# Patient Record
Sex: Female | Born: 1980 | Race: White | Hispanic: No | Marital: Single | Smoking: Never smoker
Health system: Southern US, Community
[De-identification: ages and names within clinical notes are randomized; demographics above are authoritative.]

## PROBLEM LIST (undated history)

## (undated) DIAGNOSIS — T7840XA Allergy, unspecified, initial encounter: Secondary | ICD-10-CM

## (undated) DIAGNOSIS — D649 Anemia, unspecified: Secondary | ICD-10-CM

## (undated) DIAGNOSIS — L918 Other hypertrophic disorders of the skin: Secondary | ICD-10-CM

## (undated) DIAGNOSIS — N979 Female infertility, unspecified: Secondary | ICD-10-CM

## (undated) HISTORY — DX: Allergy, unspecified, initial encounter: T78.40XA

## (undated) HISTORY — DX: Female infertility, unspecified: N97.9

## (undated) HISTORY — DX: Other hypertrophic disorders of the skin: L91.8

## (undated) HISTORY — PX: DILATION AND CURETTAGE, DIAGNOSTIC / THERAPEUTIC: SUR384

## (undated) HISTORY — DX: Anemia, unspecified: D64.9

## (undated) HISTORY — PX: ANTERIOR CRUCIATE LIGAMENT REPAIR: SHX115

---
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
## 1999-06-04 ENCOUNTER — Other Ambulatory Visit: Admission: RE | Admit: 1999-06-04 | Discharge: 1999-06-04 | Payer: Self-pay | Admitting: Family Medicine

## 2000-10-19 ENCOUNTER — Other Ambulatory Visit: Admission: RE | Admit: 2000-10-19 | Discharge: 2000-10-19 | Payer: Self-pay | Admitting: Obstetrics and Gynecology

## 2001-11-24 ENCOUNTER — Ambulatory Visit (HOSPITAL_COMMUNITY): Admission: RE | Admit: 2001-11-24 | Discharge: 2001-11-24 | Payer: Self-pay | Admitting: *Deleted

## 2002-04-09 ENCOUNTER — Inpatient Hospital Stay (HOSPITAL_COMMUNITY): Admission: AD | Admit: 2002-04-09 | Discharge: 2002-04-11 | Payer: Self-pay | Admitting: *Deleted

## 2003-05-28 ENCOUNTER — Inpatient Hospital Stay (HOSPITAL_COMMUNITY): Admission: AD | Admit: 2003-05-28 | Discharge: 2003-05-29 | Payer: Self-pay | Admitting: Obstetrics

## 2004-02-16 ENCOUNTER — Inpatient Hospital Stay (HOSPITAL_COMMUNITY): Admission: AD | Admit: 2004-02-16 | Discharge: 2004-02-17 | Payer: Self-pay | Admitting: Obstetrics and Gynecology

## 2004-02-18 ENCOUNTER — Inpatient Hospital Stay (HOSPITAL_COMMUNITY): Admission: AD | Admit: 2004-02-18 | Discharge: 2004-02-18 | Payer: Self-pay | Admitting: Obstetrics and Gynecology

## 2004-02-20 ENCOUNTER — Inpatient Hospital Stay (HOSPITAL_COMMUNITY): Admission: AD | Admit: 2004-02-20 | Discharge: 2004-02-20 | Payer: Self-pay | Admitting: Obstetrics & Gynecology

## 2004-02-29 ENCOUNTER — Inpatient Hospital Stay (HOSPITAL_COMMUNITY): Admission: AD | Admit: 2004-02-29 | Discharge: 2004-02-29 | Payer: Self-pay | Admitting: Obstetrics & Gynecology

## 2004-03-06 ENCOUNTER — Inpatient Hospital Stay (HOSPITAL_COMMUNITY): Admission: AD | Admit: 2004-03-06 | Discharge: 2004-03-06 | Payer: Self-pay | Admitting: *Deleted

## 2006-12-31 ENCOUNTER — Inpatient Hospital Stay (HOSPITAL_COMMUNITY): Admission: AD | Admit: 2006-12-31 | Discharge: 2006-12-31 | Payer: Self-pay | Admitting: Obstetrics and Gynecology

## 2007-01-02 ENCOUNTER — Inpatient Hospital Stay (HOSPITAL_COMMUNITY): Admission: AD | Admit: 2007-01-02 | Discharge: 2007-01-02 | Payer: Self-pay | Admitting: Obstetrics and Gynecology

## 2008-04-15 ENCOUNTER — Inpatient Hospital Stay (HOSPITAL_COMMUNITY): Admission: AD | Admit: 2008-04-15 | Discharge: 2008-04-15 | Payer: Self-pay | Admitting: Family Medicine

## 2008-04-17 ENCOUNTER — Inpatient Hospital Stay (HOSPITAL_COMMUNITY): Admission: AD | Admit: 2008-04-17 | Discharge: 2008-04-18 | Payer: Self-pay | Admitting: Obstetrics & Gynecology

## 2008-04-25 ENCOUNTER — Inpatient Hospital Stay (HOSPITAL_COMMUNITY): Admission: AD | Admit: 2008-04-25 | Discharge: 2008-04-25 | Payer: Self-pay | Admitting: Obstetrics & Gynecology

## 2008-04-30 ENCOUNTER — Inpatient Hospital Stay (HOSPITAL_COMMUNITY): Admission: AD | Admit: 2008-04-30 | Discharge: 2008-05-01 | Payer: Self-pay | Admitting: Obstetrics & Gynecology

## 2008-05-31 ENCOUNTER — Inpatient Hospital Stay (HOSPITAL_COMMUNITY): Admission: AD | Admit: 2008-05-31 | Discharge: 2008-06-01 | Payer: Self-pay | Admitting: Obstetrics and Gynecology

## 2008-12-02 ENCOUNTER — Inpatient Hospital Stay (HOSPITAL_COMMUNITY): Admission: AD | Admit: 2008-12-02 | Discharge: 2008-12-04 | Payer: Self-pay | Admitting: Obstetrics and Gynecology

## 2009-08-01 ENCOUNTER — Emergency Department (HOSPITAL_COMMUNITY): Admission: EM | Admit: 2009-08-01 | Discharge: 2009-08-01 | Payer: Self-pay | Admitting: Emergency Medicine

## 2009-08-02 LAB — CONVERTED CEMR LAB
HCT: 37 %
Hemoglobin: 12.4 g/dL
MCHC: 33.4 g/dL
MCV: 89.4 fL
Platelets: 325 10*3/uL
RBC: 4.14 M/uL
RDW: 14.6 %
WBC: 8.7 10*3/uL

## 2009-08-03 ENCOUNTER — Inpatient Hospital Stay (HOSPITAL_COMMUNITY): Admission: EM | Admit: 2009-08-03 | Discharge: 2009-08-04 | Payer: Self-pay | Admitting: Emergency Medicine

## 2009-08-15 ENCOUNTER — Encounter (INDEPENDENT_AMBULATORY_CARE_PROVIDER_SITE_OTHER): Payer: Self-pay | Admitting: Nurse Practitioner

## 2009-08-15 ENCOUNTER — Ambulatory Visit: Payer: Self-pay | Admitting: Internal Medicine

## 2009-08-15 DIAGNOSIS — J189 Pneumonia, unspecified organism: Secondary | ICD-10-CM

## 2009-08-15 LAB — CONVERTED CEMR LAB
ALT: 23 units/L (ref 0–35)
AST: 17 units/L (ref 0–37)
Albumin: 4.1 g/dL (ref 3.5–5.2)
Alkaline Phosphatase: 64 units/L (ref 39–117)
BUN: 14 mg/dL
BUN: 15 mg/dL (ref 6–23)
Basophils Absolute: 0 10*3/uL (ref 0.0–0.1)
Basophils Relative: 0 % (ref 0–1)
CO2: 25 meq/L (ref 19–32)
Calcium: 8.3 mg/dL
Calcium: 9.1 mg/dL (ref 8.4–10.5)
Chloride: 104 meq/L
Chloride: 105 meq/L (ref 96–112)
Creatinine, Ser: 0.71 mg/dL
Creatinine, Ser: 0.76 mg/dL (ref 0.40–1.20)
Eosinophils Absolute: 0.2 10*3/uL (ref 0.0–0.7)
Eosinophils Relative: 2 % (ref 0–5)
Glucose, Bld: 88 mg/dL
Glucose, Bld: 97 mg/dL (ref 70–99)
HCT: 41.2 % (ref 36.0–46.0)
Hemoglobin: 13.1 g/dL (ref 12.0–15.0)
Lymphocytes Relative: 24 % (ref 12–46)
Lymphs Abs: 2 10*3/uL (ref 0.7–4.0)
MCHC: 31.8 g/dL (ref 30.0–36.0)
MCV: 91.4 fL (ref 78.0–100.0)
Monocytes Absolute: 0.7 10*3/uL (ref 0.1–1.0)
Monocytes Relative: 8 % (ref 3–12)
Neutro Abs: 5.5 10*3/uL (ref 1.7–7.7)
Neutrophils Relative %: 66 % (ref 43–77)
Platelets: 446 10*3/uL — ABNORMAL HIGH (ref 150–400)
Potassium: 3.4 meq/L
Potassium: 3.8 meq/L (ref 3.5–5.3)
RBC: 4.51 M/uL (ref 3.87–5.11)
RDW: 15.3 % (ref 11.5–15.5)
Sodium: 136 meq/L
Sodium: 140 meq/L (ref 135–145)
Total Bilirubin: 0.4 mg/dL (ref 0.3–1.2)
Total Protein: 7.1 g/dL (ref 6.0–8.3)
WBC: 8.3 10*3/uL (ref 4.0–10.5)

## 2009-08-18 ENCOUNTER — Encounter (INDEPENDENT_AMBULATORY_CARE_PROVIDER_SITE_OTHER): Payer: Self-pay | Admitting: Nurse Practitioner

## 2010-07-21 NOTE — Letter (Signed)
Summary: PT INFORMATION SHEET  PT INFORMATION SHEET   Imported By: Arta Bruce 10/07/2009 15:19:54  _____________________________________________________________________  External Attachment:    Type:   Image     Comment:   External Document

## 2010-07-21 NOTE — Letter (Signed)
Summary: Handout Printed  Printed Handout:  - B.R.A.T. Diet, Adults

## 2010-07-21 NOTE — Assessment & Plan Note (Signed)
Summary: NEW - Establish Care   Vital Signs:  Patient profile:   30 year old female LMP:     07/29/2009 Height:      64.5 inches Weight:      244.8 pounds BMI:     41.52 BSA:     2.15 O2 Sat:      96 % on Room air Temp:     97.7 degrees F oral Pulse rate:   99 / minute Pulse rhythm:   regular Resp:     20 per minute BP sitting:   119 / 83  (left arm) Cuff size:   regular  Vitals Entered By: Levon Hedger (August 15, 2009 10:18 AM)  O2 Flow:  Room air CC: follow-up visit [facility] for pneumonia Is Patient Diabetic? No Pain Assessment Patient in pain? no       Does patient need assistance? Functional Status Self care Ambulation Normal LMP (date): 07/29/2009     Enter LMP: 07/29/2009   CC:  follow-up visit [facility] for pneumonia.  History of Present Illness:  Pt into the office to establish care No PCP  No signficant PMH PSH - 1997 ACL repair (left)  Hospital admission in [facility]/12/2011at [facility] for Pneumonia. Her initial presentation was with SOB, cough, N&V CXRAY - right lower lobe pneumonia (community vs aspiration) Dx Pneumonia - pt was treated with IV Rocephin and azithromycin. Pt was discharged on antibiotics which she took twice per day and she has finished the course.  After treatment symptoms got better. Appetite improved No fever No congestion but still slight cough still persists  2 children ages 65 and 8 months at home with GI illness on yesterday and she had a bout of diarrhea on yesterday.  She has done well today and ate breakfast which she was able to hold.  Social is a [employer] [place] [employer] native.   She is employed at [employer] store  CPE - last done during her pregnancy - infant if 75 months old   Habits & Providers  Alcohol-Tobacco-Diet     Alcohol drinks/day: 0     Tobacco Status: never  Exercise-Depression-Behavior     Does Patient Exercise: no     Have you felt down or hopeless? no     Have you felt little pleasure in  things? no     Depression Counseling: not indicated; screening negative for depression     Drug Use: never  Medications Prior to Update: 1)  None  Allergies (verified): 1)  ! Sulfa  Family History: maternal grandmother - diabetes mother- htn father - htn father - MI father - CVA  Social History: 2 children - natural births single Denies any ETOH, drug use or tobacco Smoking Status:  never Drug Use:  never Does Patient Exercise:  no  Review of Systems General:  Denies fever. CV:  Denies chest pain or discomfort and shortness of breath with exertion. Resp:  Complains of cough; slight cough presists but has greatly improved since hospital d/c. GI:  Complains of nausea and vomiting; denies abdominal pain; x 1 episode on yesterday.  Physical Exam  General:  alert.   Head:  normocephalic.   Lungs:  normal breath sounds.   Heart:  normal rate and regular rhythm.   Abdomen:  normal bowel sounds.   Msk:  up to the exam table Neurologic:  alert & oriented X3 and gait normal.   Skin:  color normal.   Psych:  Oriented X3.     Impression &  Recommendations:  Problem # 1:  PNEUMONIA (ICD-486) improved will recheck labs Orders: Pulse Oximetry (single measurment) (16109) T-CBC w/Diff (60454-09811) T-Comprehensive Metabolic Panel (91478-29562) F/u in 4 months for CPE  Patient Instructions: 1)  Continue a BRAT diet with mostly liquids today and slowly advancing to soft then regular diet. 2)  Labs will be checked to be sure your blood count and potassium is back to normal. 3)  Schedule a complete physical exam in April 2011. 4)  Do not eat before this visit for labs. 5)  You will need PAP, PHQ-9.   CXR  Procedure date:  08/02/2009  Findings:      Right middle lobe pneumonia

## 2010-07-21 NOTE — Letter (Signed)
Summary: *HSN Results Follow up  [facility]  [address] [place], [place] [address]   Phone: [phone]  Fax: [phone]      08/18/2009   ASLYNN BRUNETTI Humble [address] [place], [place]  [address]   Dear  Ms. Torian Schriever,                            ____S.Drinkard,FNP   ____D. Gore,FNP       ____B. McPherson,MD   ____V. Rankins,MD    ____E. Mulberry,MD    __X__N. Daphine Deutscher, FNP  ____D. Reche Dixon, MD    ____K. Philipp Deputy, MD    ____Other     This letter is to inform you that your recent test(s):  _______Pap Smear    ___X____Lab Test     _______X-ray    ___X____ is within acceptable limits  _______ requires a medication change  _______ requires a follow-up lab visit  _______ requires a follow-up visit with your provider   Comments:  Labs done during recent office visit are ok.       _________________________________________________________ If you have any questions, please contact our office [phone].                    Sincerely,    Lehman Prom FNP [facility]

## 2010-09-10 LAB — CBC
HCT: 32.5 % — ABNORMAL LOW (ref 36.0–46.0)
HCT: 32.6 % — ABNORMAL LOW (ref 36.0–46.0)
HCT: 36.8 % (ref 36.0–46.0)
HCT: 37 % (ref 36.0–46.0)
Hemoglobin: 10.8 g/dL — ABNORMAL LOW (ref 12.0–15.0)
Hemoglobin: 11 g/dL — ABNORMAL LOW (ref 12.0–15.0)
Hemoglobin: 12.4 g/dL (ref 12.0–15.0)
Hemoglobin: 12.4 g/dL (ref 12.0–15.0)
MCHC: 33.4 g/dL (ref 30.0–36.0)
MCHC: 33.4 g/dL (ref 30.0–36.0)
MCHC: 33.5 g/dL (ref 30.0–36.0)
MCHC: 33.7 g/dL (ref 30.0–36.0)
MCV: 88.8 fL (ref 78.0–100.0)
MCV: 89.4 fL (ref 78.0–100.0)
MCV: 89.4 fL (ref 78.0–100.0)
MCV: 89.8 fL (ref 78.0–100.0)
Platelets: 306 10*3/uL (ref 150–400)
Platelets: 317 10*3/uL (ref 150–400)
Platelets: 325 10*3/uL (ref 150–400)
Platelets: 326 10*3/uL (ref 150–400)
RBC: 3.65 MIL/uL — ABNORMAL LOW (ref 3.87–5.11)
RBC: 3.66 MIL/uL — ABNORMAL LOW (ref 3.87–5.11)
RBC: 4.1 MIL/uL (ref 3.87–5.11)
RBC: 4.14 MIL/uL (ref 3.87–5.11)
RDW: 14.4 % (ref 11.5–15.5)
RDW: 14.6 % (ref 11.5–15.5)
RDW: 15 % (ref 11.5–15.5)
RDW: 15.1 % (ref 11.5–15.5)
WBC: 11.6 10*3/uL — ABNORMAL HIGH (ref 4.0–10.5)
WBC: 6.4 10*3/uL (ref 4.0–10.5)
WBC: 7.4 10*3/uL (ref 4.0–10.5)
WBC: 8.7 10*3/uL (ref 4.0–10.5)

## 2010-09-10 LAB — BASIC METABOLIC PANEL
BUN: 10 mg/dL (ref 6–23)
BUN: 14 mg/dL (ref 6–23)
BUN: 5 mg/dL — ABNORMAL LOW (ref 6–23)
CO2: 23 mEq/L (ref 19–32)
CO2: 26 mEq/L (ref 19–32)
CO2: 27 mEq/L (ref 19–32)
Calcium: 8 mg/dL — ABNORMAL LOW (ref 8.4–10.5)
Calcium: 8.1 mg/dL — ABNORMAL LOW (ref 8.4–10.5)
Calcium: 8.3 mg/dL — ABNORMAL LOW (ref 8.4–10.5)
Chloride: 104 mEq/L (ref 96–112)
Chloride: 107 mEq/L (ref 96–112)
Chloride: 108 mEq/L (ref 96–112)
Creatinine, Ser: 0.62 mg/dL (ref 0.4–1.2)
Creatinine, Ser: 0.71 mg/dL (ref 0.4–1.2)
Creatinine, Ser: 0.73 mg/dL (ref 0.4–1.2)
GFR calc Af Amer: >60 mL/min (ref >60)
GFR calc Af Amer: >60 mL/min (ref >60)
GFR calc Af Amer: >60 mL/min (ref >60)
GFR calc non Af Amer: >60 mL/min (ref >60)
GFR calc non Af Amer: >60 mL/min (ref >60)
GFR calc non Af Amer: >60 mL/min (ref >60)
Glucose, Bld: 88 mg/dL (ref 70–99)
Glucose, Bld: 89 mg/dL (ref 70–99)
Glucose, Bld: 97 mg/dL (ref 70–99)
Potassium: 3.4 mEq/L — ABNORMAL LOW (ref 3.5–5.1)
Potassium: 3.4 mEq/L — ABNORMAL LOW (ref 3.5–5.1)
Potassium: 4 mEq/L (ref 3.5–5.1)
Sodium: 136 mEq/L (ref 135–145)
Sodium: 139 mEq/L (ref 135–145)
Sodium: 140 mEq/L (ref 135–145)

## 2010-09-10 LAB — CULTURE, BLOOD (ROUTINE X 2)
Culture: NO GROWTH
Culture: NO GROWTH

## 2010-09-10 LAB — COMPREHENSIVE METABOLIC PANEL
ALT: 27 U/L (ref 0–35)
AST: 18 U/L (ref 0–37)
Albumin: 3.4 g/dL — ABNORMAL LOW (ref 3.5–5.2)
Alkaline Phosphatase: 80 U/L (ref 39–117)
BUN: 14 mg/dL (ref 6–23)
CO2: 22 mEq/L (ref 19–32)
Calcium: 8.5 mg/dL (ref 8.4–10.5)
Chloride: 105 mEq/L (ref 96–112)
Creatinine, Ser: 0.74 mg/dL (ref 0.4–1.2)
GFR calc Af Amer: >60 mL/min (ref >60)
GFR calc non Af Amer: >60 mL/min (ref >60)
Glucose, Bld: 105 mg/dL — ABNORMAL HIGH (ref 70–99)
Potassium: 3.5 mEq/L (ref 3.5–5.1)
Sodium: 136 mEq/L (ref 135–145)
Total Bilirubin: 0.6 mg/dL (ref 0.3–1.2)
Total Protein: 7.2 g/dL (ref 6.0–8.3)

## 2010-09-10 LAB — DIFFERENTIAL
Basophils Absolute: 0 10*3/uL (ref 0.0–0.1)
Basophils Absolute: 0 10*3/uL (ref 0.0–0.1)
Basophils Relative: 0 % (ref 0–1)
Basophils Relative: 0 % (ref 0–1)
Eosinophils Absolute: 0 10*3/uL (ref 0.0–0.7)
Eosinophils Absolute: 0 10*3/uL (ref 0.0–0.7)
Eosinophils Relative: 0 % (ref 0–5)
Eosinophils Relative: 0 % (ref 0–5)
Lymphocytes Relative: 10 % — ABNORMAL LOW (ref 12–46)
Lymphocytes Relative: 14 % (ref 12–46)
Lymphs Abs: 1.1 10*3/uL (ref 0.7–4.0)
Lymphs Abs: 1.2 10*3/uL (ref 0.7–4.0)
Monocytes Absolute: 0.7 10*3/uL (ref 0.1–1.0)
Monocytes Absolute: 0.7 10*3/uL (ref 0.1–1.0)
Monocytes Relative: 6 % (ref 3–12)
Monocytes Relative: 9 % (ref 3–12)
Neutro Abs: 6.7 10*3/uL (ref 1.7–7.7)
Neutro Abs: 9.7 10*3/uL — ABNORMAL HIGH (ref 1.7–7.7)
Neutrophils Relative %: 77 % (ref 43–77)
Neutrophils Relative %: 84 % — ABNORMAL HIGH (ref 43–77)

## 2010-09-10 LAB — LIPASE, BLOOD: Lipase: 21 U/L (ref 11–59)

## 2010-09-28 LAB — CBC
HCT: 32.4 % — ABNORMAL LOW (ref 36.0–46.0)
HCT: 37.7 % (ref 36.0–46.0)
Hemoglobin: 11.1 g/dL — ABNORMAL LOW (ref 12.0–15.0)
Hemoglobin: 12.9 g/dL (ref 12.0–15.0)
MCHC: 34.2 g/dL (ref 30.0–36.0)
MCHC: 34.3 g/dL (ref 30.0–36.0)
MCV: 90.8 fL (ref 78.0–100.0)
MCV: 92.6 fL (ref 78.0–100.0)
Platelets: 255 10*3/uL (ref 150–400)
Platelets: 312 10*3/uL (ref 150–400)
RBC: 3.5 MIL/uL — ABNORMAL LOW (ref 3.87–5.11)
RBC: 4.16 MIL/uL (ref 3.87–5.11)
RDW: 17.7 % — ABNORMAL HIGH (ref 11.5–15.5)
RDW: 18.4 % — ABNORMAL HIGH (ref 11.5–15.5)
WBC: 14.1 10*3/uL — ABNORMAL HIGH (ref 4.0–10.5)
WBC: 15.4 10*3/uL — ABNORMAL HIGH (ref 4.0–10.5)

## 2010-09-28 LAB — SYPHILIS: RPR W/REFLEX TO RPR TITER AND TREPONEMAL ANTIBODIES, TRADITIONAL SCREENING AND DIAGNOSIS ALGORITHM: RPR Ser Ql: NONREACTIVE

## 2011-03-22 LAB — URINALYSIS, ROUTINE W REFLEX MICROSCOPIC
Glucose, UA: NEGATIVE
Glucose, UA: NEGATIVE
Hgb urine dipstick: NEGATIVE
Hgb urine dipstick: NEGATIVE
Specific Gravity, Urine: 1.02
Specific Gravity, Urine: 1.025
Urobilinogen, UA: 0.2
pH: 6.5
pH: 6.5

## 2011-03-22 LAB — COMPREHENSIVE METABOLIC PANEL
Albumin: 3.5
BUN: 10
Creatinine, Ser: 0.68
GFR calc Af Amer: >60
Potassium: 3.6
Total Protein: 6.3

## 2011-03-22 LAB — URINE CULTURE: Colony Count: >=100000

## 2011-03-22 LAB — POCT PREGNANCY, URINE: Preg Test, Ur: POSITIVE

## 2011-03-22 LAB — URINE MICROSCOPIC-ADD ON: RBC / HPF: NONE SEEN

## 2011-03-22 LAB — CBC
HCT: 38.7
MCV: 90.9
Platelets: 352
RDW: 15.4

## 2011-03-23 LAB — URINE CULTURE: Colony Count: 45000

## 2011-03-23 LAB — URINALYSIS, ROUTINE W REFLEX MICROSCOPIC
Glucose, UA: 100 — AB
Glucose, UA: NEGATIVE
Hgb urine dipstick: NEGATIVE
Ketones, ur: 40 — AB
Ketones, ur: NEGATIVE
Nitrite: NEGATIVE
Nitrite: POSITIVE — AB
Protein, ur: 100 — AB
Protein, ur: NEGATIVE
Specific Gravity, Urine: 1.025
Specific Gravity, Urine: >1.03 — ABNORMAL HIGH
Urobilinogen, UA: 0.2
Urobilinogen, UA: 2 — ABNORMAL HIGH
pH: 6
pH: 6.5

## 2011-03-23 LAB — URINE MICROSCOPIC-ADD ON

## 2011-03-23 LAB — WET PREP, GENITAL: Yeast Wet Prep HPF POC: NONE SEEN

## 2011-03-25 LAB — COMPREHENSIVE METABOLIC PANEL
ALT: 26 U/L (ref 0–35)
AST: 21 U/L (ref 0–37)
Albumin: 3.5 g/dL (ref 3.5–5.2)
Alkaline Phosphatase: 59 U/L (ref 39–117)
BUN: 10 mg/dL (ref 6–23)
CO2: 24 mEq/L (ref 19–32)
Calcium: 8.9 mg/dL (ref 8.4–10.5)
Chloride: 103 mEq/L (ref 96–112)
Creatinine, Ser: 0.64 mg/dL (ref 0.4–1.2)
GFR calc Af Amer: >60 mL/min (ref >60)
GFR calc non Af Amer: >60 mL/min (ref >60)
Glucose, Bld: 90 mg/dL (ref 70–99)
Potassium: 3.6 mEq/L (ref 3.5–5.1)
Sodium: 135 mEq/L (ref 135–145)
Total Bilirubin: 0.7 mg/dL (ref 0.3–1.2)
Total Protein: 7.1 g/dL (ref 6.0–8.3)

## 2011-03-25 LAB — CBC
HCT: 41 % (ref 36.0–46.0)
Hemoglobin: 13.5 g/dL (ref 12.0–15.0)
MCHC: 33 g/dL (ref 30.0–36.0)
MCV: 91.4 fL (ref 78.0–100.0)
Platelets: 335 10*3/uL (ref 150–400)
RBC: 4.48 MIL/uL (ref 3.87–5.11)
RDW: 15.8 % — ABNORMAL HIGH (ref 11.5–15.5)
WBC: 11.5 10*3/uL — ABNORMAL HIGH (ref 4.0–10.5)

## 2011-03-25 LAB — URINALYSIS, ROUTINE W REFLEX MICROSCOPIC
Glucose, UA: 100 mg/dL — AB
Hgb urine dipstick: NEGATIVE
Ketones, ur: >80 mg/dL — AB
Leukocytes, UA: NEGATIVE
Nitrite: NEGATIVE
Protein, ur: 30 mg/dL — AB
Specific Gravity, Urine: >1.03 — ABNORMAL HIGH (ref 1.005–1.030)
Urobilinogen, UA: 0.2 mg/dL (ref 0.0–1.0)
pH: 6 (ref 5.0–8.0)

## 2011-03-25 LAB — URINE MICROSCOPIC-ADD ON

## 2011-04-06 LAB — CBC
MCHC: 32.6
Platelets: 424 — ABNORMAL HIGH
RDW: 16 — ABNORMAL HIGH

## 2011-04-06 LAB — POCT PREGNANCY, URINE
Operator id: 140111
Preg Test, Ur: POSITIVE

## 2011-04-06 LAB — GC/CHLAMYDIA PROBE AMP, GENITAL: GC Probe Amp, Genital: NEGATIVE

## 2011-04-06 LAB — WET PREP, GENITAL: Yeast Wet Prep HPF POC: NONE SEEN

## 2011-04-06 LAB — ABO/RH: ABO/RH(D): A POS

## 2011-07-12 ENCOUNTER — Encounter (HOSPITAL_COMMUNITY): Payer: Self-pay

## 2011-07-12 ENCOUNTER — Emergency Department (HOSPITAL_COMMUNITY)
Admission: EM | Admit: 2011-07-12 | Discharge: 2011-07-12 | Disposition: A | Discharge status: Home / Self Care | Payer: Self-pay | Attending: Emergency Medicine | Admitting: Emergency Medicine

## 2011-07-12 ENCOUNTER — Emergency Department (HOSPITAL_COMMUNITY): Payer: Self-pay

## 2011-07-12 DIAGNOSIS — R109 Unspecified abdominal pain: Secondary | ICD-10-CM | POA: Insufficient documentation

## 2011-07-12 DIAGNOSIS — T148XXA Other injury of unspecified body region, initial encounter: Secondary | ICD-10-CM | POA: Insufficient documentation

## 2011-07-12 DIAGNOSIS — M549 Dorsalgia, unspecified: Secondary | ICD-10-CM | POA: Insufficient documentation

## 2011-07-12 DIAGNOSIS — X58XXXA Exposure to other specified factors, initial encounter: Secondary | ICD-10-CM | POA: Insufficient documentation

## 2011-07-12 LAB — DIFFERENTIAL
Basophils Absolute: 0 10*3/uL (ref 0.0–0.1)
Basophils Relative: 0 % (ref 0–1)
Eosinophils Absolute: 0.2 10*3/uL (ref 0.0–0.7)
Eosinophils Relative: 3 % (ref 0–5)
Lymphocytes Relative: 30 % (ref 12–46)
Lymphs Abs: 2.7 10*3/uL (ref 0.7–4.0)
Monocytes Absolute: 0.7 10*3/uL (ref 0.1–1.0)
Monocytes Relative: 8 % (ref 3–12)
Neutro Abs: 5.4 10*3/uL (ref 1.7–7.7)
Neutrophils Relative %: 59 % (ref 43–77)

## 2011-07-12 LAB — COMPREHENSIVE METABOLIC PANEL
ALT: 14 U/L (ref 0–35)
AST: 14 U/L (ref 0–37)
Albumin: 3.4 g/dL — ABNORMAL LOW (ref 3.5–5.2)
Alkaline Phosphatase: 61 U/L (ref 39–117)
BUN: 18 mg/dL (ref 6–23)
CO2: 27 mEq/L (ref 19–32)
Calcium: 9.2 mg/dL (ref 8.4–10.5)
Chloride: 99 mEq/L (ref 96–112)
Creatinine, Ser: 0.69 mg/dL (ref 0.50–1.10)
GFR calc Af Amer: >90 mL/min (ref >90)
GFR calc non Af Amer: >90 mL/min (ref >90)
Glucose, Bld: 100 mg/dL — ABNORMAL HIGH (ref 70–99)
Potassium: 4.2 mEq/L (ref 3.5–5.1)
Sodium: 135 mEq/L (ref 135–145)
Total Bilirubin: 0.1 mg/dL — ABNORMAL LOW (ref 0.3–1.2)
Total Protein: 7 g/dL (ref 6.0–8.3)

## 2011-07-12 LAB — LIPASE, BLOOD: Lipase: 27 U/L (ref 11–59)

## 2011-07-12 LAB — CBC
HCT: 37 % (ref 36.0–46.0)
Hemoglobin: 12 g/dL (ref 12.0–15.0)
MCH: 28.6 pg (ref 26.0–34.0)
MCHC: 32.4 g/dL (ref 30.0–36.0)
MCV: 88.1 fL (ref 78.0–100.0)
Platelets: 318 10*3/uL (ref 150–400)
RBC: 4.2 MIL/uL (ref 3.87–5.11)
RDW: 13.8 % (ref 11.5–15.5)
WBC: 9.1 10*3/uL (ref 4.0–10.5)

## 2011-07-12 LAB — URINALYSIS, ROUTINE W REFLEX MICROSCOPIC
Bilirubin Urine: NEGATIVE
Glucose, UA: NEGATIVE mg/dL
Hgb urine dipstick: NEGATIVE
Ketones, ur: NEGATIVE mg/dL
Leukocytes, UA: NEGATIVE
Nitrite: NEGATIVE
Protein, ur: NEGATIVE mg/dL
Specific Gravity, Urine: 1.017 (ref 1.005–1.030)
Urobilinogen, UA: 0.2 mg/dL (ref 0.0–1.0)
pH: 6.5 (ref 5.0–8.0)

## 2011-07-12 LAB — POCT PREGNANCY, URINE: Preg Test, Ur: NEGATIVE

## 2011-07-12 MED ORDER — HYDROCODONE-ACETAMINOPHEN 5-325 MG PO TABS: 1.0000 | ORAL_TABLET | ORAL | Status: AC | PRN

## 2011-07-12 MED ORDER — IBUPROFEN 400 MG PO TABS: 400.0000 mg | ORAL_TABLET | Freq: Four times a day (QID) | ORAL | Status: AC | PRN

## 2011-07-12 MED ORDER — GI COCKTAIL ~~LOC~~
30.0000 mL | Freq: Once | ORAL | Status: AC
  Administered 2011-07-12: 30 mL via ORAL
  Filled 2011-07-12: qty 30

## 2011-07-12 MED ORDER — CYCLOBENZAPRINE HCL 10 MG PO TABS: 10.0000 mg | ORAL_TABLET | Freq: Three times a day (TID) | ORAL | Status: AC | PRN

## 2011-07-12 NOTE — ED Provider Notes (Signed)
History     CSN: [id number]  Arrival date & time 07/12/11  0133   First MD Initiated Contact with Patient 07/12/11 0202      Chief Complaint  Patient presents with  . Abdominal Pain  . Flank Pain     Patient is a 31 y.o. female presenting with abdominal pain and flank pain. The history is provided by the patient.  Abdominal Pain The primary symptoms of the illness include abdominal pain. The primary symptoms of the illness do not include fever, nausea, vomiting, diarrhea or dysuria. The current episode started yesterday. The onset of the illness was gradual. The problem has been gradually improving.  The patient states that she believes she is currently not pregnant. The patient has not had a change in bowel habit. Additional symptoms associated with the illness include back pain. Symptoms associated with the illness do not include chills, anorexia or constipation.  Flank Pain Associated symptoms include abdominal pain. Pertinent negatives include no anorexia, chills, fever, nausea or vomiting.  This 31 y/o obese female reports onset of bil upper back pain last night that resolved with Tylenol. States the upper mid back pain started again tonight at approximately 8:00, at which time she also ate a meal. At approximately 9:00 in addition to the upper mid back pain she began to have upper abdominal pain in the epigastrium that radiates to the right upper quadrant. Evonnie Pat has been a "hard ache" has been constant since onset but intensity has varied. States pain worsened after lying down. Was unable to sleep due to pain.She denies fever, n/v, diarrhea or any other associated symptoms. She denies any recent illnesses. States she was unable to sleep due to pain. Pain has gradually improved since arrival but states earlier was 10/10. Of note, pt states that in the first week of January she began an over-the-counter diet aid which she cannot remember the name of and started a new exercise  program.   History reviewed. No pertinent past medical history.  History reviewed. No pertinent past surgical history.  No family history on file.  History  Substance Use Topics  . Smoking status: Never Smoker   . Smokeless tobacco: Not on file  . Alcohol Use: No    OB History    Grav Para Term Preterm Abortions TAB SAB Ect Mult Living                  Review of Systems  Constitutional: Negative.  Negative for fever and chills.  HENT: Negative.   Eyes: Negative.   Respiratory: Negative.   Cardiovascular: Negative.   Gastrointestinal: Positive for abdominal pain. Negative for nausea, vomiting, diarrhea, constipation and anorexia.  Genitourinary: Positive for flank pain. Negative for dysuria.  Musculoskeletal: Positive for back pain.  Skin: Negative.   Neurological: Negative.   Hematological: Negative.   Psychiatric/Behavioral: Negative.     Allergies  Sulfonamide derivatives  Home Medications  No current outpatient prescriptions on file.  BP 156/84  Pulse 83  Temp(Src) 98.6 F (37 C) (Oral)  Resp 18  SpO2 99%  LMP 07/01/2011  Physical Exam  Constitutional: She is oriented to person, place, and time. She appears well-developed and well-nourished.  HENT:  Head: Normocephalic and atraumatic.  Eyes: Conjunctivae are normal.  Neck: Neck supple.  Cardiovascular: Normal rate and regular rhythm.   Pulmonary/Chest: Effort normal and breath sounds normal.  Abdominal: Soft. Bowel sounds are normal.    Musculoskeletal: Normal range of motion.  Arms: Neurological: She is alert and oriented to person, place, and time.  Skin: Skin is warm and dry. No erythema.  Psychiatric: She has a normal mood and affect.    ED Course  Procedures  Pt has continued to decline medication for pain but does admit GI cocktail seemed to help. Findings and clinical impression discussed w/ pt. Will plan to treat for muscle strain and give abd pain precautions. Will provide  referrals for PCP and encourage pt to get established w/ a PCP. Pt to return for worsening symptoms. Pt is agreeable w/ plan.   Labs Reviewed  CBC  DIFFERENTIAL  LIPASE, BLOOD  COMPREHENSIVE METABOLIC PANEL  URINALYSIS, ROUTINE W REFLEX MICROSCOPIC  POCT PREGNANCY, URINE   No results found.   No diagnosis found.    MDM  HPI/PE and clinical findings c/w upper back pain likely muscle strain given pt's recent new exercise program. Abd pain onset after eating and worse after lying down possibly reflux. Given clinical findings doubt acute abd process.         Leanne Chang, NP 07/12/11 [phone]

## 2011-07-12 NOTE — ED Notes (Signed)
US in with pt. 

## 2011-07-12 NOTE — ED Notes (Signed)
bil flank pain and upper quad stomach pain- denies N/V and fever

## 2011-07-12 NOTE — ED Provider Notes (Signed)
Medical screening examination/treatment/procedure(s) were performed by non-physician practitioner and as supervising physician I was immediately available for consultation/collaboration.   Heyli Min A Aslan Himes, MD 07/12/11 2311 

## 2011-07-12 NOTE — ED Notes (Signed)
Pt ambulated to bathroom without assistance for U/A

## 2013-06-23 ENCOUNTER — Encounter (HOSPITAL_COMMUNITY): Payer: Self-pay | Admitting: Emergency Medicine

## 2013-06-23 ENCOUNTER — Emergency Department (HOSPITAL_COMMUNITY)
Admission: EM | Admit: 2013-06-23 | Discharge: 2013-06-24 | Disposition: A | Discharge status: Home / Self Care | Payer: No Typology Code available for payment source | Attending: Emergency Medicine | Admitting: Emergency Medicine

## 2013-06-23 DIAGNOSIS — R197 Diarrhea, unspecified: Secondary | ICD-10-CM | POA: Insufficient documentation

## 2013-06-23 DIAGNOSIS — R519 Headache, unspecified: Secondary | ICD-10-CM

## 2013-06-23 DIAGNOSIS — E860 Dehydration: Secondary | ICD-10-CM | POA: Insufficient documentation

## 2013-06-23 DIAGNOSIS — J020 Streptococcal pharyngitis: Secondary | ICD-10-CM | POA: Insufficient documentation

## 2013-06-23 DIAGNOSIS — R112 Nausea with vomiting, unspecified: Secondary | ICD-10-CM | POA: Insufficient documentation

## 2013-06-23 DIAGNOSIS — Z3202 Encounter for pregnancy test, result negative: Secondary | ICD-10-CM | POA: Insufficient documentation

## 2013-06-23 DIAGNOSIS — R51 Headache: Secondary | ICD-10-CM

## 2013-06-23 DIAGNOSIS — R509 Fever, unspecified: Secondary | ICD-10-CM | POA: Insufficient documentation

## 2013-06-23 LAB — LIPASE, BLOOD: LIPASE: 20 U/L (ref 11–59)

## 2013-06-23 LAB — COMPREHENSIVE METABOLIC PANEL
ALBUMIN: 3.6 g/dL (ref 3.5–5.2)
ALK PHOS: 89 U/L (ref 39–117)
ALT: 16 U/L (ref 0–35)
AST: 12 U/L (ref 0–37)
BILIRUBIN TOTAL: 0.2 mg/dL — AB (ref 0.3–1.2)
BUN: 13 mg/dL (ref 6–23)
CHLORIDE: 102 meq/L (ref 96–112)
CO2: 24 mEq/L (ref 19–32)
CREATININE: 0.67 mg/dL (ref 0.50–1.10)
Calcium: 9.1 mg/dL (ref 8.4–10.5)
GFR calc non Af Amer: >90 mL/min (ref >90)
GLUCOSE: 112 mg/dL — AB (ref 70–99)
Potassium: 4.1 mEq/L (ref 3.7–5.3)
Sodium: 139 mEq/L (ref 137–147)
Total Protein: 7.8 g/dL (ref 6.0–8.3)

## 2013-06-23 LAB — CBC WITH DIFFERENTIAL/PLATELET
BASOS PCT: 0 % (ref 0–1)
Basophils Absolute: 0 10*3/uL (ref 0.0–0.1)
EOS ABS: 0.1 10*3/uL (ref 0.0–0.7)
Eosinophils Relative: 1 % (ref 0–5)
HEMATOCRIT: 36.7 % (ref 36.0–46.0)
HEMOGLOBIN: 11.7 g/dL — AB (ref 12.0–15.0)
LYMPHS ABS: 1.5 10*3/uL (ref 0.7–4.0)
Lymphocytes Relative: 9 % — ABNORMAL LOW (ref 12–46)
MCH: 25.9 pg — AB (ref 26.0–34.0)
MCHC: 31.9 g/dL (ref 30.0–36.0)
MCV: 81.2 fL (ref 78.0–100.0)
MONO ABS: 1 10*3/uL (ref 0.1–1.0)
MONOS PCT: 6 % (ref 3–12)
NEUTROS ABS: 13.9 10*3/uL — AB (ref 1.7–7.7)
Neutrophils Relative %: 84 % — ABNORMAL HIGH (ref 43–77)
Platelets: 454 10*3/uL — ABNORMAL HIGH (ref 150–400)
RBC: 4.52 MIL/uL (ref 3.87–5.11)
RDW: 15.3 % (ref 11.5–15.5)
WBC: 16.6 10*3/uL — ABNORMAL HIGH (ref 4.0–10.5)

## 2013-06-23 NOTE — ED Notes (Signed)
Patient reports that she has been vomiting for that last couple of days. The patient now reports that she is having a headache and sore throat

## 2013-06-24 LAB — URINALYSIS, ROUTINE W REFLEX MICROSCOPIC
Glucose, UA: NEGATIVE mg/dL
KETONES UR: 15 mg/dL — AB
LEUKOCYTES UA: NEGATIVE
NITRITE: NEGATIVE
PH: 6 (ref 5.0–8.0)
PROTEIN: 30 mg/dL — AB
Specific Gravity, Urine: 1.025 (ref 1.005–1.030)
UROBILINOGEN UA: 0.2 mg/dL (ref 0.0–1.0)

## 2013-06-24 LAB — URINE MICROSCOPIC-ADD ON

## 2013-06-24 LAB — POCT PREGNANCY, URINE: Preg Test, Ur: NEGATIVE

## 2013-06-24 LAB — RAPID STREP SCREEN (MED CTR MEBANE ONLY): Streptococcus, Group A Screen (Direct): POSITIVE — AB

## 2013-06-24 MED ORDER — ONDANSETRON 8 MG PO TBDP: 8.0000 mg | ORAL_TABLET | Freq: Three times a day (TID) | ORAL | Status: DC | PRN

## 2013-06-24 MED ORDER — SODIUM CHLORIDE 0.9 % IV SOLN
1000.0000 mL | Freq: Once | INTRAVENOUS | Status: AC
  Administered 2013-06-24: 1000 mL via INTRAVENOUS

## 2013-06-24 MED ORDER — AMOXICILLIN 500 MG PO CAPS: 1000.0000 mg | ORAL_CAPSULE | Freq: Three times a day (TID) | ORAL | Status: DC

## 2013-06-24 MED ORDER — SODIUM CHLORIDE 0.9 % IV SOLN
1000.0000 mL | INTRAVENOUS | Status: DC
  Administered 2013-06-24: 1000 mL via INTRAVENOUS

## 2013-06-24 MED ORDER — AMOXICILLIN 500 MG PO CAPS
1000.0000 mg | ORAL_CAPSULE | Freq: Once | ORAL | Status: AC
  Administered 2013-06-24: 1000 mg via ORAL
  Filled 2013-06-24: qty 2

## 2013-06-24 MED ORDER — METOCLOPRAMIDE HCL 5 MG/ML IJ SOLN
10.0000 mg | Freq: Once | INTRAMUSCULAR | Status: AC
  Administered 2013-06-24: 10 mg via INTRAVENOUS
  Filled 2013-06-24: qty 2

## 2013-06-24 MED ORDER — DIPHENHYDRAMINE HCL 50 MG/ML IJ SOLN
25.0000 mg | Freq: Once | INTRAMUSCULAR | Status: AC
  Administered 2013-06-24: 25 mg via INTRAVENOUS
  Filled 2013-06-24: qty 1

## 2013-06-24 NOTE — ED Provider Notes (Signed)
CSN: [id number]     Arrival date & time 06/23/13  2204 History   First MD Initiated Contact with Patient 06/24/13 0031     Chief Complaint  Patient presents with  . Headache  . Emesis   (Consider location/radiation/quality/duration/timing/severity/associated sxs/prior Treatment) HPI 33 yo female presents to the ER from home with complaint of 2-3 days of n/v, some diarrhea, and generalized abd cramping.  She has had subjective fevers.  Today she started with sore throat and headache.  Pt reports she drinks many mountain dews a day, and has been unable to eat or drink due to n/v.  No neck stiffness, no weakness.  Pt's father reports she has had increasing headaches over the last several months, and recently got insurance in order to get headaches worked up.  No cough, no sob, no focal abd pain.  HA is global.  No photo/phono phobia.   History reviewed. No pertinent past medical history. History reviewed. No pertinent past surgical history. History reviewed. No pertinent family history. History  Substance Use Topics  . Smoking status: Never Smoker   . Smokeless tobacco: Not on file  . Alcohol Use: No   OB History   Grav Para Term Preterm Abortions TAB SAB Ect Mult Living                 Review of Systems  All other systems reviewed and are negative.    Allergies  Sulfonamide derivatives  Home Medications   Current Outpatient Rx  Name  Route  Sig  Dispense  Refill  . amoxicillin (AMOXIL) 500 MG capsule   Oral   Take 2 capsules (1,000 mg total) by mouth 3 (three) times daily.   60 capsule   0   . ondansetron (ZOFRAN ODT) 8 MG disintegrating tablet   Oral   Take 1 tablet (8 mg total) by mouth every 8 (eight) hours as needed for nausea or vomiting.   20 tablet   0    BP 145/88  Pulse 107  Temp(Src) 97.8 F (36.6 C) (Oral)  Resp 18  Ht 5\' 7"  (1.702 m)  Wt 265 lb 9.6 oz (120.475 kg)  BMI 41.59 kg/m2  SpO2 99%  LMP 06/23/2013 Physical Exam  Nursing note and vitals  reviewed. Constitutional: She is oriented to person, place, and time. She appears well-developed and well-nourished. She appears distressed.  HENT:  Head: Normocephalic and atraumatic.  Right Ear: External ear normal.  Left Ear: External ear normal.  Nose: Nose normal.  Dry mucous membranes  Eyes: Conjunctivae and EOM are normal. Pupils are equal, round, and reactive to light.  Neck: Normal range of motion. Neck supple. No JVD present. No tracheal deviation present. No thyromegaly present.  No nuchal rigidity  Cardiovascular: Normal rate, regular rhythm, normal heart sounds and intact distal pulses.  Exam reveals no gallop and no friction rub.   No murmur heard. Pulmonary/Chest: Effort normal and breath sounds normal. No stridor. No respiratory distress. She has no wheezes. She has no rales. She exhibits no tenderness.  Abdominal: Soft. Bowel sounds are normal. She exhibits no distension and no mass. There is no tenderness. There is no rebound and no guarding.  Musculoskeletal: Normal range of motion. She exhibits no edema and no tenderness.  Lymphadenopathy:    She has no cervical adenopathy.  Neurological: She is alert and oriented to person, place, and time. She has normal reflexes. No cranial nerve deficit. She exhibits normal muscle tone. Coordination normal.  Skin: Skin is  warm and dry. No rash noted. No erythema. No pallor.  Psychiatric: She has a normal mood and affect. Her behavior is normal. Judgment and thought content normal.    ED Course  Procedures (including critical care time) Labs Review Labs Reviewed  RAPID STREP SCREEN - Abnormal; Notable for the following:    Streptococcus, Group A Screen (Direct) POSITIVE (*)    All other components within normal limits  CBC WITH DIFFERENTIAL - Abnormal; Notable for the following:    WBC 16.6 (*)    Hemoglobin 11.7 (*)    MCH 25.9 (*)    Platelets 454 (*)    Neutrophils Relative % 84 (*)    Neutro Abs 13.9 (*)    Lymphocytes  Relative 9 (*)    All other components within normal limits  COMPREHENSIVE METABOLIC PANEL - Abnormal; Notable for the following:    Glucose, Bld 112 (*)    Total Bilirubin 0.2 (*)    All other components within normal limits  URINALYSIS, ROUTINE W REFLEX MICROSCOPIC - Abnormal; Notable for the following:    Color, Urine AMBER (*)    APPearance CLOUDY (*)    Hgb urine dipstick LARGE (*)    Bilirubin Urine SMALL (*)    Ketones, ur 15 (*)    Protein, ur 30 (*)    All other components within normal limits  LIPASE, BLOOD  URINE MICROSCOPIC-ADD ON  POCT PREGNANCY, URINE   Imaging Review No results found.  EKG Interpretation   None       MDM   1. Strep pharyngitis   2. Headache   3. Dehydration   4. Nausea vomiting and diarrhea    33 year old female with 2 days of nausea and vomiting.  Headache started today.  Headache is global.  She has heavy caffeine use, and has been unable to eat or drink.  She has not drank caffeine for the last 2 days.  Father reports she has had ongoing headaches intermittently for the last several months.  She has a sore throat.  No meningeal signs.  Do not feel symptoms are due to meningitis.  She is found to be strep positive.  Will treat.  Patient with control of nausea and vomiting after fluids and Zofran.  She is tolerating by mouth.  Will refer to neurology for her headaches.   Kalman Drape, MD 06/27/13 2021

## 2013-06-24 NOTE — Discharge Instructions (Signed)
Take medications as prescribed.  Stick to bland diet until feeling better.  Follow up with [facility] or [facility] for further workup of your ongoing headaches.  Keep a log of your headaches to help aid in your diagnosis.   Dehydration, Adult Dehydration is when you lose more fluids from the body than you take in. Vital organs like the kidneys, brain, and heart cannot function without a proper amount of fluids and salt. Any loss of fluids from the body can cause dehydration.  CAUSES   Vomiting.  Diarrhea.  Excessive sweating.  Excessive urine output.  Fever. SYMPTOMS  Mild dehydration  Thirst.  Dry lips.  Slightly dry mouth. Moderate dehydration  Very dry mouth.  Sunken eyes.  Skin does not bounce back quickly when lightly pinched and released.  Dark urine and decreased urine production.  Decreased tear production.  Headache. Severe dehydration  Very dry mouth.  Extreme thirst.  Rapid, weak pulse (more than 100 beats per minute at rest).  Cold hands and feet.  Not able to sweat in spite of heat and temperature.  Rapid breathing.  Blue lips.  Confusion and lethargy.  Difficulty being awakened.  Minimal urine production.  No tears. DIAGNOSIS  Your caregiver will diagnose dehydration based on your symptoms and your exam. Blood and urine tests will help confirm the diagnosis. The diagnostic evaluation should also identify the cause of dehydration. TREATMENT  Treatment of mild or moderate dehydration can often be done at home by increasing the amount of fluids that you drink. It is best to drink small amounts of fluid more often. Drinking too much at one time can make vomiting worse. Refer to the home care instructions below. Severe dehydration needs to be treated at the hospital where you will probably be given intravenous (IV) fluids that contain water and electrolytes. HOME CARE INSTRUCTIONS   Ask your caregiver about specific  rehydration instructions.  Drink enough fluids to keep your urine clear or pale yellow.  Drink small amounts frequently if you have nausea and vomiting.  Eat as you normally do.  Avoid:  Foods or drinks high in sugar.  Carbonated drinks.  Juice.  Extremely hot or cold fluids.  Drinks with caffeine.  Fatty, greasy foods.  Alcohol.  Tobacco.  Overeating.  Gelatin desserts.  Wash your hands well to avoid spreading bacteria and viruses.  Only take over-the-counter or prescription medicines for pain, discomfort, or fever as directed by your caregiver.  Ask your caregiver if you should continue all prescribed and over-the-counter medicines.  Keep all follow-up appointments with your caregiver. SEEK MEDICAL CARE IF:  You have abdominal pain and it increases or stays in one area (localizes).  You have a rash, stiff neck, or severe headache.  You are irritable, sleepy, or difficult to awaken.  You are weak, dizzy, or extremely thirsty. SEEK IMMEDIATE MEDICAL CARE IF:   You are unable to keep fluids down or you get worse despite treatment.  You have frequent episodes of vomiting or diarrhea.  You have blood or green matter (bile) in your vomit.  You have blood in your stool or your stool looks black and tarry.  You have not urinated in 6 to 8 hours, or you have only urinated a small amount of very dark urine.  You have a fever.  You faint. MAKE SURE YOU:   Understand these instructions.  Will watch your condition.  Will get help right away if you are not doing well or get  worse. Document Released: 06/07/2005 Document Revised: 08/30/2011 Document Reviewed: 01/25/2011 [facility] Patient Information 2014 [place], [place].  Diarrhea Diarrhea is frequent loose and watery bowel movements. It can cause you to feel weak and dehydrated. Dehydration can cause you to become tired and thirsty, have a dry mouth, and have decreased urination that often is dark yellow.  Diarrhea is a sign of another problem, most often an infection that will not last long. In most cases, diarrhea typically lasts 2 3 days. However, it can last longer if it is a sign of something more serious. It is important to treat your diarrhea as directed by your caregive to lessen or prevent future episodes of diarrhea. CAUSES  Some common causes include:  Gastrointestinal infections caused by viruses, bacteria, or parasites.  Food poisoning or food allergies.  Certain medicines, such as antibiotics, chemotherapy, and laxatives.  Artificial sweeteners and fructose.  Digestive disorders. HOME CARE INSTRUCTIONS  Ensure adequate fluid intake (hydration): have 1 cup (8 oz) of fluid for each diarrhea episode. Avoid fluids that contain simple sugars or sports drinks, fruit juices, whole milk products, and sodas. Your urine should be clear or pale yellow if you are drinking enough fluids. Hydrate with an oral rehydration solution that you can purchase at pharmacies, retail stores, and online. You can prepare an oral rehydration solution at home by mixing the following ingredients together:    tsp table salt.   tsp baking soda.   tsp salt substitute containing potassium chloride.  1  tablespoons sugar.  1 L (34 oz) of water.  Certain foods and beverages may increase the speed at which food moves through the gastrointestinal (GI) tract. These foods and beverages should be avoided and include:  Caffeinated and alcoholic beverages.  High-fiber foods, such as raw fruits and vegetables, nuts, seeds, and whole grain breads and cereals.  Foods and beverages sweetened with sugar alcohols, such as xylitol, sorbitol, and mannitol.  Some foods may be well tolerated and may help thicken stool including:  Starchy foods, such as rice, toast, pasta, low-sugar cereal, oatmeal, grits, baked potatoes, crackers, and bagels.  Bananas.  Applesauce.  Add probiotic-rich foods to help increase  healthy bacteria in the GI tract, such as yogurt and fermented milk products.  Wash your hands well after each diarrhea episode.  Only take over-the-counter or prescription medicines as directed by your caregiver.  Take a warm bath to relieve any burning or pain from frequent diarrhea episodes. SEEK IMMEDIATE MEDICAL CARE IF:   You are unable to keep fluids down.  You have persistent vomiting.  You have blood in your stool, or your stools are black and tarry.  You do not urinate in 6 8 hours, or there is only a small amount of very dark urine.  You have abdominal pain that increases or localizes.  You have weakness, dizziness, confusion, or lightheadedness.  You have a severe headache.  Your diarrhea gets worse or does not get better.  You have a fever or persistent symptoms for more than 2 3 days.  You have a fever and your symptoms suddenly get worse. MAKE SURE YOU:   Understand these instructions.  Will watch your condition.  Will get help right away if you are not doing well or get worse. Document Released: 05/28/2002 Document Revised: 05/24/2012 Document Reviewed: 02/13/2012 [facility] Patient Information 2014 [place], [place].  Diet for Diarrhea, Adult Frequent, runny stools (diarrhea) may be caused or worsened by food or drink. Diarrhea may be relieved by changing your  diet. Since diarrhea can last up to 7 days, it is easy for you to lose too much fluid from the body and become dehydrated. Fluids that are lost need to be replaced. Along with a modified diet, make sure you drink enough fluids to keep your urine clear or pale yellow. DIET INSTRUCTIONS  Ensure adequate fluid intake (hydration): have 1 cup (8 oz) of fluid for each diarrhea episode. Avoid fluids that contain simple sugars or sports drinks, fruit juices, whole milk products, and sodas. Your urine should be clear or pale yellow if you are drinking enough fluids. Hydrate with an oral rehydration solution that  you can purchase at pharmacies, retail stores, and online. You can prepare an oral rehydration solution at home by mixing the following ingredients together:    tsp table salt.   tsp baking soda.   tsp salt substitute containing potassium chloride.  1  tablespoons sugar.  1 L (34 oz) of water.  Certain foods and beverages may increase the speed at which food moves through the gastrointestinal (GI) tract. These foods and beverages should be avoided and include:  Caffeinated and alcoholic beverages.  High-fiber foods, such as raw fruits and vegetables, nuts, seeds, and whole grain breads and cereals.  Foods and beverages sweetened with sugar alcohols, such as xylitol, sorbitol, and mannitol.  Some foods may be well tolerated and may help thicken stool including:  Starchy foods, such as rice, toast, pasta, low-sugar cereal, oatmeal, grits, baked potatoes, crackers, and bagels.   Bananas.   Applesauce.  Add probiotic-rich foods to help increase healthy bacteria in the GI tract, such as yogurt and fermented milk products. RECOMMENDED FOODS AND BEVERAGES Starches Choose foods with less than 2 g of fiber per serving.  Recommended:  White, [place], and pita breads, plain rolls, buns, bagels. Plain muffins, matzo. Soda, saltine, or graham crackers. Pretzels, melba toast, zwieback. Cooked cereals made with water: cornmeal, farina, cream cereals. Dry cereals: refined corn, wheat, rice. Potatoes prepared any way without skins, refined macaroni, spaghetti, noodles, refined rice.  Avoid:  Bread, rolls, or crackers made with whole wheat, multi-grains, rye, bran seeds, nuts, or coconut. Corn tortillas or taco shells. Cereals containing whole grains, multi-grains, bran, coconut, nuts, raisins. Cooked or dry oatmeal. Coarse wheat cereals, granola. Cereals advertised as "high-fiber." Potato skins. Whole grain pasta, wild or brown rice. Popcorn. Sweet potatoes, yams. Sweet rolls, doughnuts, waffles,  pancakes, sweet breads. Vegetables  Recommended: Strained tomato and vegetable juices. Most well-cooked and canned vegetables without seeds. Fresh: Tender lettuce, cucumber without the skin, cabbage, spinach, bean sprouts.  Avoid: Fresh, cooked, or canned: Artichokes, baked beans, beet greens, broccoli, Brussels sprouts, corn, kale, legumes, peas, sweet potatoes. Cooked: Green or red cabbage, spinach. Avoid large servings of any vegetables because vegetables shrink when cooked, and they contain more fiber per serving than fresh vegetables. Fruit  Recommended: Cooked or canned: Apricots, applesauce, cantaloupe, cherries, fruit cocktail, grapefruit, grapes, kiwi, mandarin oranges, peaches, pears, plums, watermelon. Fresh: Apples without skin, ripe banana, grapes, cantaloupe, cherries, grapefruit, peaches, oranges, plums. Keep servings limited to  cup or 1 piece.  Avoid: Fresh: Apples with skin, apricots, mangoes, pears, raspberries, strawberries. Prune juice, stewed or dried prunes. Dried fruits, raisins, dates. Large servings of all fresh fruits. Protein  Recommended: Ground or well-cooked tender beef, ham, veal, lamb, pork, or poultry. Eggs. Fish, oysters, shrimp, lobster, other seafoods. Liver, organ meats.  Avoid: Tough, fibrous meats with gristle. Peanut butter, smooth or chunky. Cheese, nuts, seeds, legumes, dried peas,  beans, lentils. Dairy  Recommended: Yogurt, lactose-free milk, kefir, drinkable yogurt, buttermilk, soy milk, or plain hard cheese.  Avoid: Milk, chocolate milk, beverages made with milk, such as milkshakes. Soups  Recommended: Bouillon, broth, or soups made from allowed foods. Any strained soup.  Avoid: Soups made from vegetables that are not allowed, cream or milk-based soups. Desserts and Sweets  Recommended: Sugar-free gelatin, sugar-free frozen ice pops made without sugar alcohol.  Avoid: Plain cakes and cookies, pie made with fruit, pudding, custard, cream  pie. Gelatin, fruit, ice, sherbet, frozen ice pops. Ice cream, ice milk without nuts. Plain hard candy, honey, jelly, molasses, syrup, sugar, chocolate syrup, gumdrops, marshmallows. Fats and Oils  Recommended: Limit fats to less than 8 tsp per day.  Avoid: Seeds, nuts, olives, avocados. Margarine, butter, cream, mayonnaise, salad oils, plain salad dressings. Plain gravy, crisp bacon without rind. Beverages  Recommended: Water, decaffeinated teas, oral rehydration solutions, sugar-free beverages not sweetened with sugar alcohols.  Avoid: Fruit juices, caffeinated beverages (coffee, tea, soda), alcohol, sports drinks, or lemon-lime soda. Condiments  Recommended: Ketchup, mustard, horseradish, vinegar, cocoa powder. Spices in moderation: allspice, basil, bay leaves, celery powder or leaves, cinnamon, cumin powder, curry powder, ginger, mace, marjoram, onion or garlic powder, oregano, paprika, parsley flakes, ground pepper, rosemary, sage, savory, tarragon, thyme, turmeric.  Avoid: Coconut, honey. Document Released: 08/28/2003 Document Revised: 03/01/2012 Document Reviewed: 10/22/2011 [facility] Patient Information 2014 [place].  Headaches, Frequently Asked Questions MIGRAINE HEADACHES Q: What is migraine? What causes it? How can I treat it? A: Generally, migraine headaches begin as a dull ache. Then they develop into a constant, throbbing, and pulsating pain. You may experience pain at the temples. You may experience pain at the front or back of one or both sides of the head. The pain is usually accompanied by a combination of:  Nausea.  Vomiting.  Sensitivity to light and noise. Some people (about 15%) experience an aura (see below) before an attack. The cause of migraine is believed to be chemical reactions in the brain. Treatment for migraine may include over-the-counter or prescription medications. It may also include self-help techniques. These include relaxation training and  biofeedback.  Q: What is an aura? A: About 15% of people with migraine get an "aura". This is a sign of neurological symptoms that occur before a migraine headache. You may see wavy or jagged lines, dots, or flashing lights. You might experience tunnel vision or blind spots in one or both eyes. The aura can include visual or auditory hallucinations (something imagined). It may include disruptions in smell (such as strange odors), taste or touch. Other symptoms include:  Numbness.  A "pins and needles" sensation.  Difficulty in recalling or speaking the correct word. These neurological events may last as long as 60 minutes. These symptoms will fade as the headache begins. Q: What is a trigger? A: Certain physical or environmental factors can lead to or "trigger" a migraine. These include:  Foods.  Hormonal changes.  Weather.  Stress. It is important to remember that triggers are different for everyone. To help prevent migraine attacks, you need to figure out which triggers affect you. Keep a headache diary. This is a good way to track triggers. The diary will help you talk to your healthcare professional about your condition. Q: Does weather affect migraines? A: Bright sunshine, hot, humid conditions, and drastic changes in barometric pressure may lead to, or "trigger," a migraine attack in some people. But studies have shown that weather does not act  as a trigger for everyone with migraines. Q: What is the link between migraine and hormones? A: Hormones start and regulate many of your body's functions. Hormones keep your body in balance within a constantly changing environment. The levels of hormones in your body are unbalanced at times. Examples are during menstruation, pregnancy, or menopause. That can lead to a migraine attack. In fact, about three quarters of all women with migraine report that their attacks are related to the menstrual cycle.  Q: Is there an increased risk of stroke for  migraine sufferers? A: The likelihood of a migraine attack causing a stroke is very remote. That is not to say that migraine sufferers cannot have a stroke associated with their migraines. In persons under age 16, the most common associated factor for stroke is migraine headache. But over the course of a person's normal life span, the occurrence of migraine headache may actually be associated with a reduced risk of dying from cerebrovascular disease due to stroke.  Q: What are acute medications for migraine? A: Acute medications are used to treat the pain of the headache after it has started. Examples over-the-counter medications, NSAIDs, ergots, and triptans.  Q: What are the triptans? A: Triptans are the newest class of abortive medications. They are specifically targeted to treat migraine. Triptans are vasoconstrictors. They moderate some chemical reactions in the brain. The triptans work on receptors in your brain. Triptans help to restore the balance of a neurotransmitter called serotonin. Fluctuations in levels of serotonin are thought to be a main cause of migraine.  Q: Are over-the-counter medications for migraine effective? A: Over-the-counter, or "OTC," medications may be effective in relieving mild to moderate pain and associated symptoms of migraine. But you should see your caregiver before beginning any treatment regimen for migraine.  Q: What are preventive medications for migraine? A: Preventive medications for migraine are sometimes referred to as "prophylactic" treatments. They are used to reduce the frequency, severity, and length of migraine attacks. Examples of preventive medications include antiepileptic medications, antidepressants, beta-blockers, calcium channel blockers, and NSAIDs (nonsteroidal anti-inflammatory drugs). Q: Why are anticonvulsants used to treat migraine? A: During the past few years, there has been an increased interest in antiepileptic drugs for the prevention of  migraine. They are sometimes referred to as "anticonvulsants". Both epilepsy and migraine may be caused by similar reactions in the brain.  Q: Why are antidepressants used to treat migraine? A: Antidepressants are typically used to treat people with depression. They may reduce migraine frequency by regulating chemical levels, such as serotonin, in the brain.  Q: What alternative therapies are used to treat migraine? A: The term "alternative therapies" is often used to describe treatments considered outside the scope of conventional Western medicine. Examples of alternative therapy include acupuncture, acupressure, and yoga. Another common alternative treatment is herbal therapy. Some herbs are believed to relieve headache pain. Always discuss alternative therapies with your caregiver before proceeding. Some herbal products contain arsenic and other toxins. TENSION HEADACHES Q: What is a tension-type headache? What causes it? How can I treat it? A: Tension-type headaches occur randomly. They are often the result of temporary stress, anxiety, fatigue, or anger. Symptoms include soreness in your temples, a tightening band-like sensation around your head (a "vice-like" ache). Symptoms can also include a pulling feeling, pressure sensations, and contracting head and neck muscles. The headache begins in your forehead, temples, or the back of your head and neck. Treatment for tension-type headache may include over-the-counter or prescription medications. Treatment  may also include self-help techniques such as relaxation training and biofeedback. CLUSTER HEADACHES Q: What is a cluster headache? What causes it? How can I treat it? A: Cluster headache gets its name because the attacks come in groups. The pain arrives with little, if any, warning. It is usually on one side of the head. A tearing or bloodshot eye and a runny nose on the same side of the headache may also accompany the pain. Cluster headaches are  believed to be caused by chemical reactions in the brain. They have been described as the most severe and intense of any headache type. Treatment for cluster headache includes prescription medication and oxygen. SINUS HEADACHES Q: What is a sinus headache? What causes it? How can I treat it? A: When a cavity in the bones of the face and skull (a sinus) becomes inflamed, the inflammation will cause localized pain. This condition is usually the result of an allergic reaction, a tumor, or an infection. If your headache is caused by a sinus blockage, such as an infection, you will probably have a fever. An x-ray will confirm a sinus blockage. Your caregiver's treatment might include antibiotics for the infection, as well as antihistamines or decongestants.  REBOUND HEADACHES Q: What is a rebound headache? What causes it? How can I treat it? A: A pattern of taking acute headache medications too often can lead to a condition known as "rebound headache." A pattern of taking too much headache medication includes taking it more than 2 days per week or in excessive amounts. That means more than the label or a caregiver advises. With rebound headaches, your medications not only stop relieving pain, they actually begin to cause headaches. Doctors treat rebound headache by tapering the medication that is being overused. Sometimes your caregiver will gradually substitute a different type of treatment or medication. Stopping may be a challenge. Regularly overusing a medication increases the potential for serious side effects. Consult a caregiver if you regularly use headache medications more than 2 days per week or more than the label advises. ADDITIONAL QUESTIONS AND ANSWERS Q: What is biofeedback? A: Biofeedback is a self-help treatment. Biofeedback uses special equipment to monitor your body's involuntary physical responses. Biofeedback monitors:  Breathing.  Pulse.  Heart rate.  Temperature.  Muscle  tension.  Brain activity. Biofeedback helps you refine and perfect your relaxation exercises. You learn to control the physical responses that are related to stress. Once the technique has been mastered, you do not need the equipment any more. Q: Are headaches hereditary? A: Four out of five (80%) of people that suffer report a family history of migraine. Scientists are not sure if this is genetic or a family predisposition. Despite the uncertainty, a child has a 50% chance of having migraine if one parent suffers. The child has a 75% chance if both parents suffer.  Q: Can children get headaches? A: By the time they reach high school, most young people have experienced some type of headache. Many safe and effective approaches or medications can prevent a headache from occurring or stop it after it has begun.  Q: What type of doctor should I see to diagnose and treat my headache? A: Start with your primary caregiver. Discuss his or her experience and approach to headaches. Discuss methods of classification, diagnosis, and treatment. Your caregiver may decide to recommend you to a headache specialist, depending upon your symptoms or other physical conditions. Having diabetes, allergies, etc., may require a more comprehensive and inclusive approach  to your headache. The National Head[employer] Foundation will provide, upon request, a list of [facility] physician members in your state. Document Released: 08/28/2003 Document Revised: 08/30/2011 Document Reviewed: 02/05/2008 [facility] Patient Information 2014 [place].  Nausea and Vomiting Nausea is a sick feeling that often comes before throwing up (vomiting). Vomiting is a reflex where stomach contents come out of your mouth. Vomiting can cause severe loss of body fluids (dehydration). Children and elderly adults can become dehydrated quickly, especially if they also have diarrhea. Nausea and vomiting are symptoms of a condition or disease. It is important to  find the cause of your symptoms. CAUSES   Direct irritation of the stomach lining. This irritation can result from increased acid production (gastroesophageal reflux disease), infection, food poisoning, taking certain medicines (such as nonsteroidal anti-inflammatory drugs), alcohol use, or tobacco use.  Signals from the brain.These signals could be caused by a headache, heat exposure, an inner ear disturbance, increased pressure in the brain from injury, infection, a tumor, or a concussion, pain, emotional stimulus, or metabolic problems.  An obstruction in the gastrointestinal tract (bowel obstruction).  Illnesses such as diabetes, hepatitis, gallbladder problems, appendicitis, kidney problems, cancer, sepsis, atypical symptoms of a heart attack, or eating disorders.  Medical treatments such as chemotherapy and radiation.  Receiving medicine that makes you sleep (general anesthetic) during surgery. DIAGNOSIS Your caregiver may ask for tests to be done if the problems do not improve after a few days. Tests may also be done if symptoms are severe or if the reason for the nausea and vomiting is not clear. Tests may include:  Urine tests.  Blood tests.  Stool tests.  Cultures (to look for evidence of infection).  X-rays or other imaging studies. Test results can help your caregiver make decisions about treatment or the need for additional tests. TREATMENT You need to stay well hydrated. Drink frequently but in small amounts.You may wish to drink water, sports drinks, clear broth, or eat frozen ice pops or gelatin dessert to help stay hydrated.When you eat, eating slowly may help prevent nausea.There are also some antinausea medicines that may help prevent nausea. HOME CARE INSTRUCTIONS   Take all medicine as directed by your caregiver.  If you do not have an appetite, do not force yourself to eat. However, you must continue to drink fluids.  If you have an appetite, eat a normal  diet unless your caregiver tells you differently.  Eat a variety of complex carbohydrates (rice, wheat, potatoes, bread), lean meats, yogurt, fruits, and vegetables.  Avoid high-fat foods because they are more difficult to digest.  Drink enough water and fluids to keep your urine clear or pale yellow.  If you are dehydrated, ask your caregiver for specific rehydration instructions. Signs of dehydration may include:  Severe thirst.  Dry lips and mouth.  Dizziness.  Dark urine.  Decreasing urine frequency and amount.  Confusion.  Rapid breathing or pulse. SEEK IMMEDIATE MEDICAL CARE IF:   You have blood or brown flecks (like coffee grounds) in your vomit.  You have black or bloody stools.  You have a severe headache or stiff neck.  You are confused.  You have severe abdominal pain.  You have chest pain or trouble breathing.  You do not urinate at least once every 8 hours.  You develop cold or clammy skin.  You continue to vomit for longer than 24 to 48 hours.  You have a fever. MAKE SURE YOU:   Understand these instructions.  Will watch  your condition.  Will get help right away if you are not doing well or get worse. Document Released: 06/07/2005 Document Revised: 08/30/2011 Document Reviewed: 11/04/2010 [facility] Patient Information 2014 [place], [place].  Viral and Bacterial Pharyngitis Pharyngitis is soreness (inflammation) or infection of the pharynx. It is also called a sore throat. CAUSES  Most sore throats are caused by viruses and are part of a cold. However, some sore throats are caused by strep and other bacteria. Sore throats can also be caused by post nasal drip from draining sinuses, allergies and sometimes from sleeping with an open mouth. Infectious sore throats can be spread from person to person by coughing, sneezing and sharing cups or eating utensils. TREATMENT  Sore throats that are viral usually last 3-4 days. Viral illness will get better  without medications (antibiotics). Strep throat and other bacterial infections will usually begin to get better about 24-48 hours after you begin to take antibiotics. HOME CARE INSTRUCTIONS   If the caregiver feels there is a bacterial infection or if there is a positive strep test, they will prescribe an antibiotic. The full course of antibiotics must be taken. If the full course of antibiotic is not taken, you or your child may become ill again. If you or your child has strep throat and do not finish all of the medication, serious heart or kidney diseases may develop.  Drink enough water and fluids to keep your urine clear or pale yellow.  Only take over-the-counter or prescription medicines for pain, discomfort or fever as directed by your caregiver.  Get lots of rest.  Gargle with salt water ( tsp. of salt in a glass of water) as often as every 1-2 hours as you need for comfort.  Hard candies may soothe the throat if individual is not at risk for choking. Throat sprays or lozenges may also be used. SEEK MEDICAL CARE IF:   Large, tender lumps in the neck develop.  A rash develops.  Green, yellow-brown or bloody sputum is coughed up.  Your baby is older than 3 months with a rectal temperature of 100.5 F (38.1 C) or higher for more than 1 day. SEEK IMMEDIATE MEDICAL CARE IF:   A stiff neck develops.  You or your child are drooling or unable to swallow liquids.  You or your child are vomiting, unable to keep medications or liquids down.  You or your child has severe pain, unrelieved with recommended medications.  You or your child are having difficulty breathing (not due to stuffy nose).  You or your child are unable to fully open your mouth.  You or your child develop redness, swelling, or severe pain anywhere on the neck.  You have a fever.  Your baby is older than 3 months with a rectal temperature of 102 F (38.9 C) or higher.  Your baby is 74 months old or younger  with a rectal temperature of 100.4 F (38 C) or higher. MAKE SURE YOU:   Understand these instructions.  Will watch your condition.  Will get help right away if you are not doing well or get worse. Document Released: 06/07/2005 Document Revised: 08/30/2011 Document Reviewed: 09/04/2007 [facility] Patient Information 2014 [place], [place].

## 2013-09-17 IMAGING — US US ABDOMEN COMPLETE
1 series · 13 of 25 positions shown · non-contrast
Comparison: None

CLINICAL DATA: Right upper quadrant and epigastric pain for 2
days.

ABDOMINAL ULTRASOUND COMPLETE

[Series 1: us abdomen complete · 0.36mm/px · 13 of 60 slices shown]
[im 1/60]
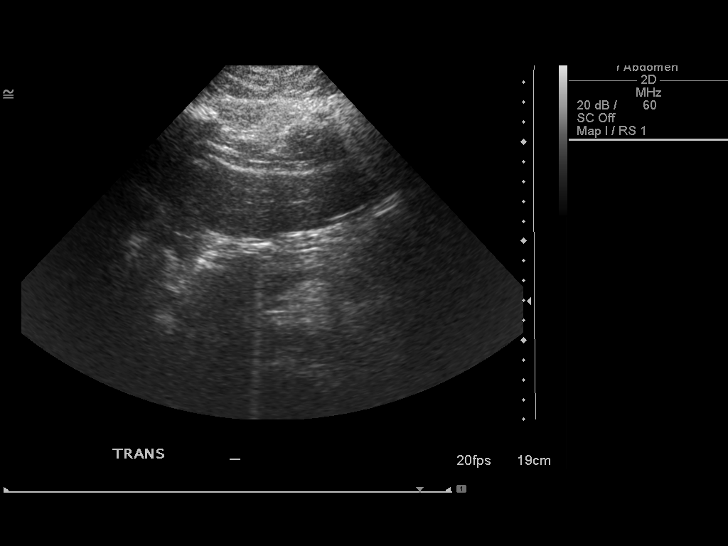
[im 5/60]
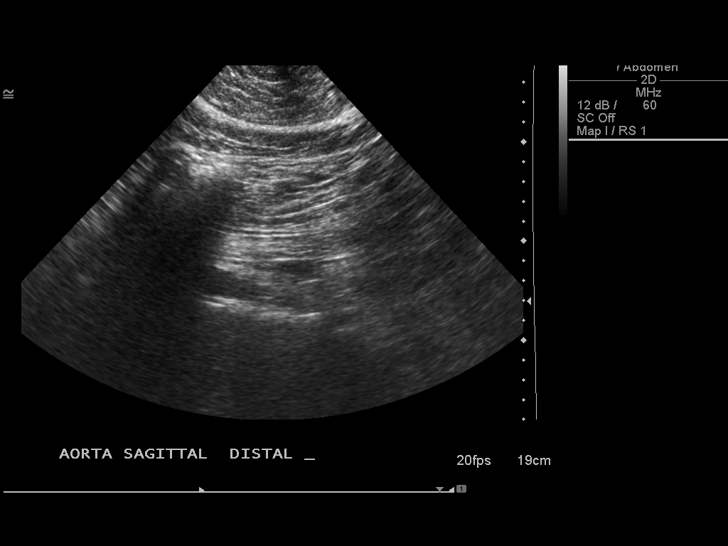
[im 10/60]
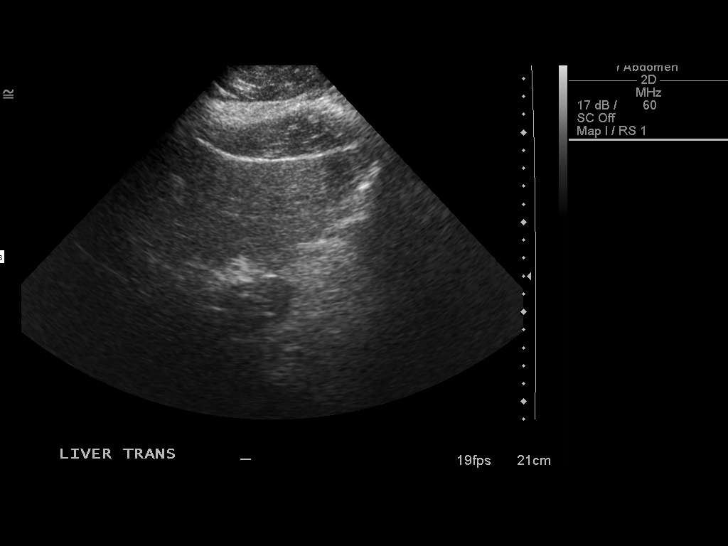
[im 15/60]
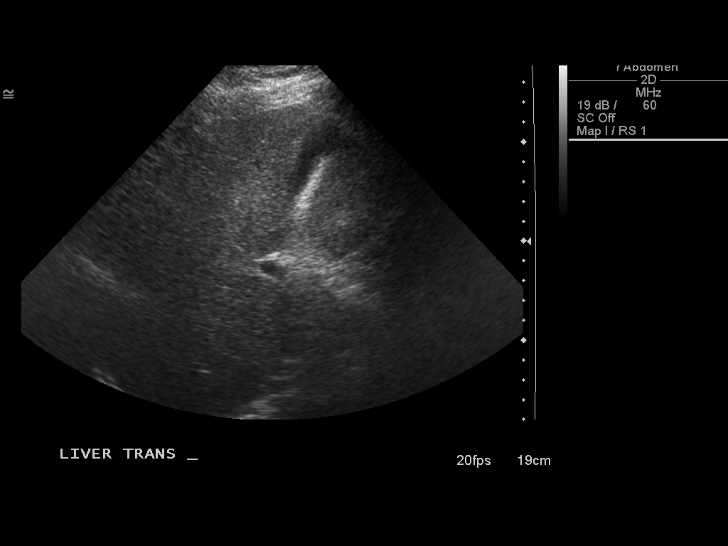
[im 20/60]
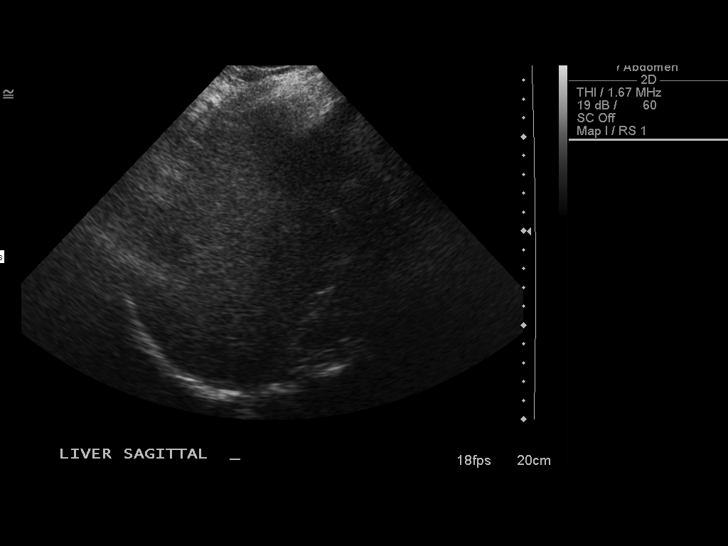
[im 25/60]
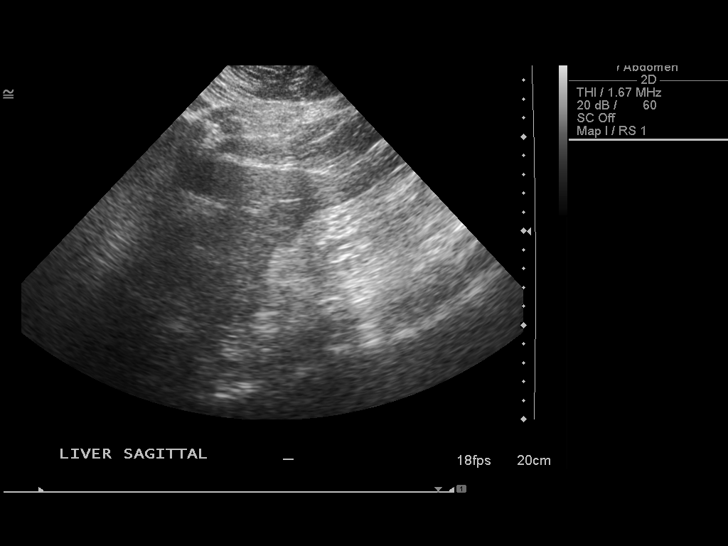
[im 30/60]
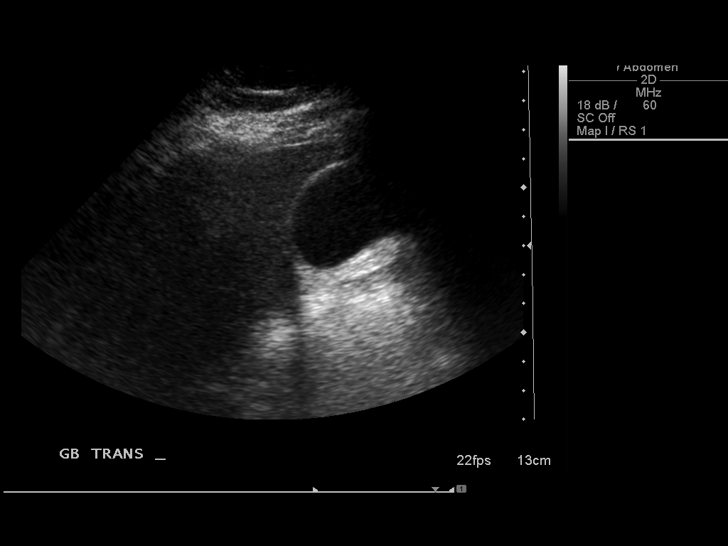
[im 35/60]
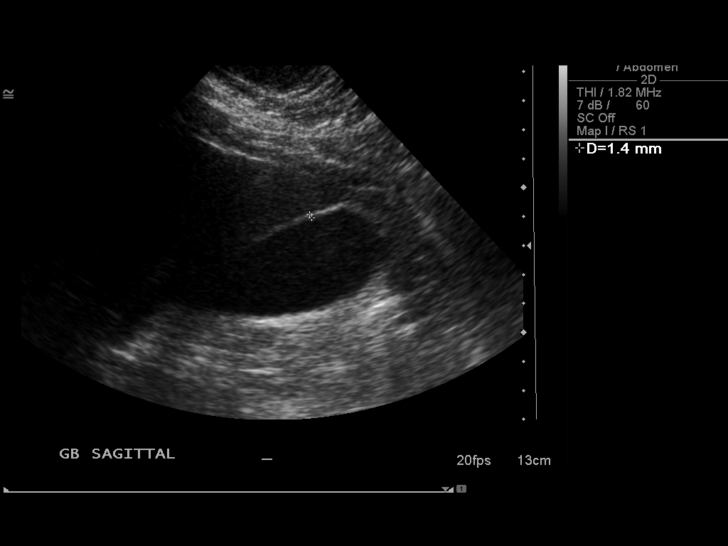
[im 40/60]
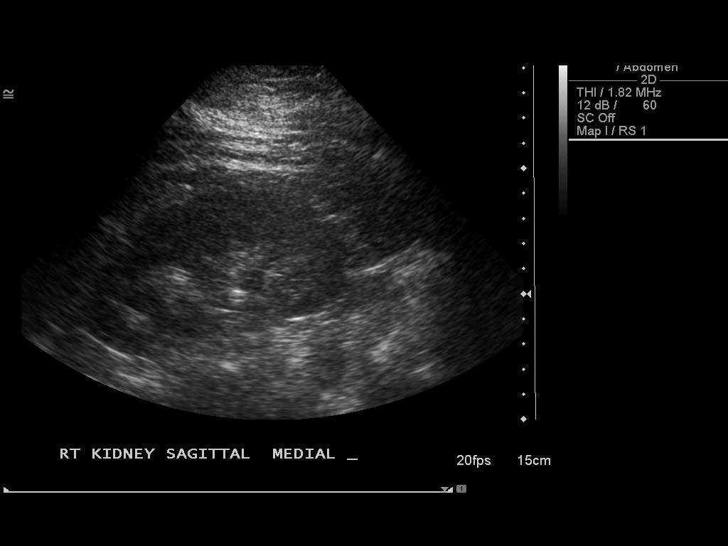
[im 45/60]
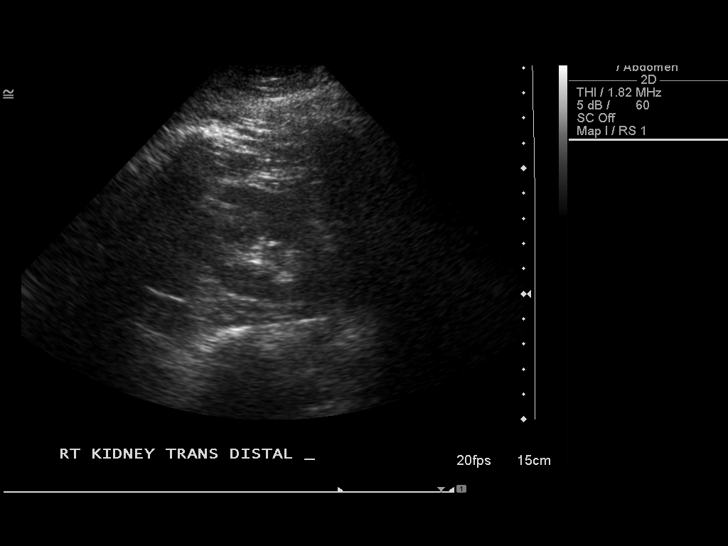
[im 50/60]
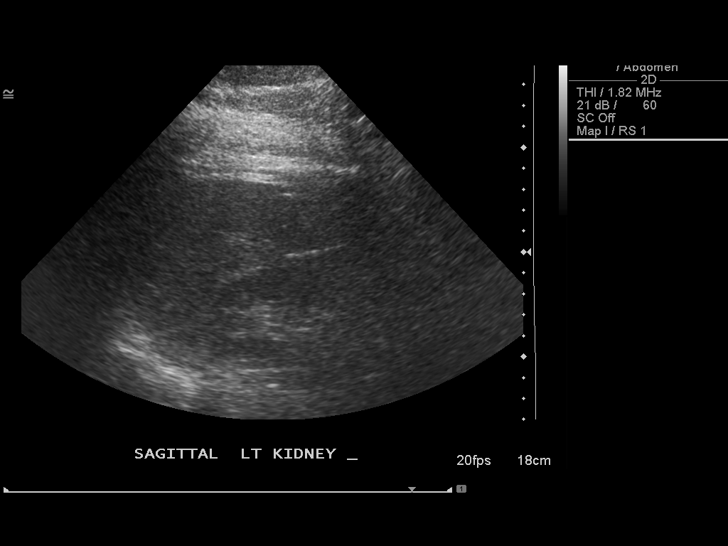
[im 55/60]
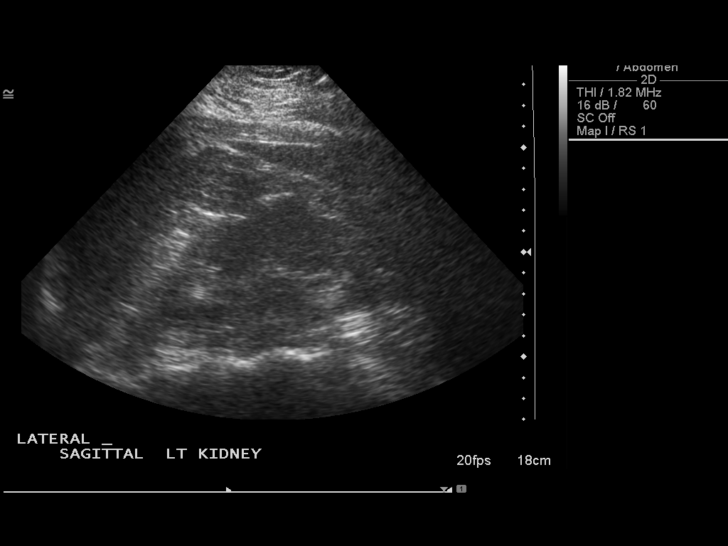
[im 60/60]
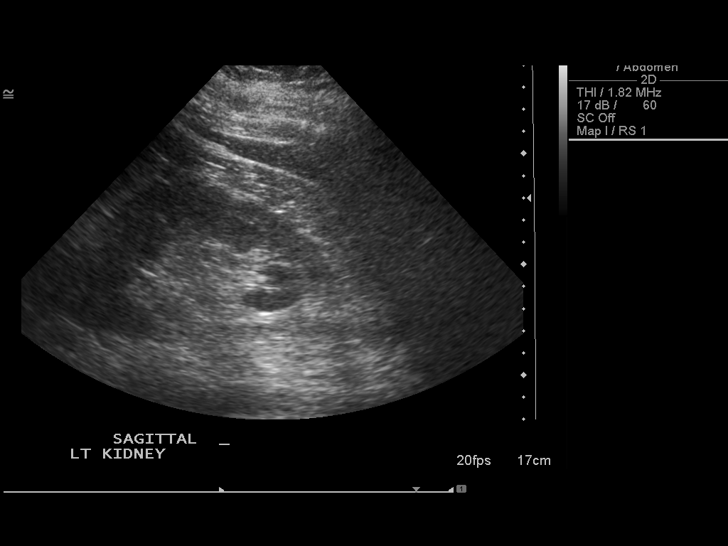

[13 of 25 positions shown; findings below may reference images not displayed]

FINDINGS: Gallbladder:  The gallbladder is normal in appearance, without
evidence for gallstones, gallbladder wall thickening or
pericholecystic fluid.  No ultrasonographic Murphy's sign is
elicited.

Common Bile Duct:  0.4 cm in diameter; within normal limits in
caliber.

Liver:  Borderline parenchymal echogenicity and coarsened
echotexture, raising question for mild fatty infiltration; no focal
lesions identified.  Limited Doppler evaluation demonstrates normal
blood flow within the liver.

IVC:  Unremarkable in appearance.

Pancreas:  Although the pancreas is difficult to visualize in its
entirety due to overlying bowel gas, no focal pancreatic
abnormality is identified.

Spleen:  9.6 cm in length; within normal limits in size and
echotexture.

Right kidney:  11.7 cm in length; normal in size, configuration and
parenchymal echogenicity.  No evidence of mass or hydronephrosis.

Left kidney:  13.5 cm in length; normal in size, configuration and
parenchymal echogenicity.  No evidence of mass or hydronephrosis.

Abdominal Aorta:  Normal in caliber; no aneurysm identified.  Not
visualized distally due to overlying bowel gas.
IMPRESSION: 1.  Gallbladder unremarkable in appearance.
2.  Suggestion of mild fatty infiltration within the liver.

## 2013-09-18 ENCOUNTER — Ambulatory Visit (INDEPENDENT_AMBULATORY_CARE_PROVIDER_SITE_OTHER): Payer: No Typology Code available for payment source | Admitting: Family Medicine

## 2013-09-18 ENCOUNTER — Encounter: Payer: Self-pay | Admitting: Family Medicine

## 2013-09-18 ENCOUNTER — Other Ambulatory Visit: Payer: Self-pay | Admitting: Family Medicine

## 2013-09-18 VITALS — BP 138/86 | HR 92 | Temp 98.3°F | Resp 16 | Ht 65.0 in | Wt 275.0 lb

## 2013-09-18 DIAGNOSIS — Z01419 Encounter for gynecological examination (general) (routine) without abnormal findings: Secondary | ICD-10-CM

## 2013-09-18 DIAGNOSIS — Z0000 Encounter for general adult medical examination without abnormal findings: Secondary | ICD-10-CM

## 2013-09-18 DIAGNOSIS — R7309 Other abnormal glucose: Secondary | ICD-10-CM | POA: Insufficient documentation

## 2013-09-18 DIAGNOSIS — Z124 Encounter for screening for malignant neoplasm of cervix: Secondary | ICD-10-CM

## 2013-09-18 DIAGNOSIS — K219 Gastro-esophageal reflux disease without esophagitis: Secondary | ICD-10-CM

## 2013-09-18 DIAGNOSIS — R7303 Prediabetes: Secondary | ICD-10-CM | POA: Insufficient documentation

## 2013-09-18 DIAGNOSIS — Z87898 Personal history of other specified conditions: Secondary | ICD-10-CM

## 2013-09-18 LAB — COMPLETE METABOLIC PANEL WITH GFR
ALBUMIN: 3.9 g/dL (ref 3.5–5.2)
ALK PHOS: 58 U/L (ref 39–117)
ALT: 14 U/L (ref 0–35)
AST: 13 U/L (ref 0–37)
BUN: 13 mg/dL (ref 6–23)
CALCIUM: 8.7 mg/dL (ref 8.4–10.5)
CHLORIDE: 104 meq/L (ref 96–112)
CO2: 27 mEq/L (ref 19–32)
Creat: 0.67 mg/dL (ref 0.50–1.10)
GFR, Est African American: >89 mL/min
GLUCOSE: 89 mg/dL (ref 70–99)
Potassium: 4.4 mEq/L (ref 3.5–5.3)
Sodium: 138 mEq/L (ref 135–145)
Total Bilirubin: 0.2 mg/dL (ref 0.2–1.2)
Total Protein: 6.7 g/dL (ref 6.0–8.3)

## 2013-09-18 LAB — CBC WITH DIFFERENTIAL/PLATELET
BASOS ABS: 0 10*3/uL (ref 0.0–0.1)
Basophils Relative: 0 % (ref 0–1)
Eosinophils Absolute: 0.2 10*3/uL (ref 0.0–0.7)
Eosinophils Relative: 2 % (ref 0–5)
HCT: 27.7 % — ABNORMAL LOW (ref 36.0–46.0)
Hemoglobin: 9 g/dL — ABNORMAL LOW (ref 12.0–15.0)
LYMPHS PCT: 31 % (ref 12–46)
Lymphs Abs: 3.1 10*3/uL (ref 0.7–4.0)
MCH: 24.8 pg — ABNORMAL LOW (ref 26.0–34.0)
MCHC: 32.5 g/dL (ref 30.0–36.0)
MCV: 76.3 fL — ABNORMAL LOW (ref 78.0–100.0)
Monocytes Absolute: 0.8 10*3/uL (ref 0.1–1.0)
Monocytes Relative: 8 % (ref 3–12)
NEUTROS PCT: 59 % (ref 43–77)
Neutro Abs: 5.8 10*3/uL (ref 1.7–7.7)
PLATELETS: 498 10*3/uL — AB (ref 150–400)
RBC: 3.63 MIL/uL — ABNORMAL LOW (ref 3.87–5.11)
RDW: 15.5 % (ref 11.5–15.5)
WBC: 9.9 10*3/uL (ref 4.0–10.5)

## 2013-09-18 LAB — POCT URINALYSIS DIPSTICK
BILIRUBIN UA: NEGATIVE
Blood, UA: NEGATIVE
GLUCOSE UA: NEGATIVE
KETONES UA: NEGATIVE
LEUKOCYTES UA: NEGATIVE
NITRITE UA: NEGATIVE
PH UA: 7
Protein, UA: NEGATIVE
Spec Grav, UA: 1.02
Urobilinogen, UA: 0.2

## 2013-09-18 LAB — LDL CHOLESTEROL, DIRECT: Direct LDL: 130 mg/dL — ABNORMAL HIGH

## 2013-09-18 LAB — POCT GLYCOSYLATED HEMOGLOBIN (HGB A1C): Hemoglobin A1C: 5.8

## 2013-09-18 LAB — VITAMIN D 25 HYDROXY (VIT D DEFICIENCY, FRACTURES): VIT D 25 HYDROXY: 27 ng/mL — AB (ref 30–89)

## 2013-09-18 MED ORDER — OMEPRAZOLE 20 MG PO CPDR: 20.0000 mg | DELAYED_RELEASE_CAPSULE | Freq: Every day | ORAL | Status: DC

## 2013-09-18 NOTE — Patient Instructions (Addendum)
Keeping You Healthy  Get These Tests 1. Blood Pressure- Have your blood pressure checked once a year by your health care provider.  Normal blood pressure is 120/80. 2. Weight- Have your body mass index (BMI) calculated to screen for obesity.  BMI is measure of body fat based on height and weight.  You can also calculate your own BMI at GravelBags[employer]it. 3. Cholesterol- Have your cholesterol checked every 5 years starting at age 35 then yearly starting at age 14. 62. Chlamydia, HIV, and other sexually transmitted diseases- Get screened every year until age 33, then within three months of each new sexual provider. 5. Pap Smear- Every 1-3 years; discuss with your health care provider. 6. Mammogram- Every year starting at age 73  Take these medicines  Calcium with Vitamin D-Your body needs 1200 mg of Calcium each day and [phone] IU of Vitamin D daily.  Your body can only absorb 500 mg of Calcium at a time so Calcium must be taken in 2 or 3 divided doses throughout the day.  Multivitamin with folic acid- Once daily if it is possible for you to become pregnant.  Get these Immunizations  Gardasil-Series of three doses; prevents HPV related illness such as genital warts and cervical cancer.  Menactra-Single dose; prevents meningitis.  Tetanus shot- Every 10 years.  Flu shot-Every year.  Take these steps 1. Do not smoke-Your healthcare provider can help you quit.  For tips on how to quit go to www.smokefree.gov or call 1-800 QUITNOW. 2. Be physically active- Exercise 5 days a week for at least 30 minutes.  If you are not already physically active, start slow and gradually work up to 30 minutes of moderate physical activity.  Examples of moderate activity include walking briskly, dancing, swimming, bicycling, etc. 3. Breast Cancer- A self breast exam every month is important for early detection of breast cancer.  For more information and instruction on self breast exams, ask your  healthcare provider or https://www.patel.info/. 4. Eat a healthy diet- Eat a variety of healthy foods such as fruits, vegetables, whole grains, low fat milk, low fat cheeses, yogurt, lean meats, poultry and fish, beans, nuts, tofu, etc.  For more information go to www. Thenutritionsource.org 5. Drink alcohol in moderation- Limit alcohol intake to one drink or less per day. Never drink and drive. 6. Depression- Your emotional health is as important as your physical health.  If you're feeling down or losing interest in things you normally enjoy please talk to your healthcare provider about being screened for depression. 7. Dental visit- Brush and floss your teeth twice daily; visit your dentist twice a year. 8. Eye doctor- Get an eye exam at least every 2 years. 9. Helmet use- Always wear a helmet when riding a bicycle, motorcycle, rollerblading or skateboarding. 40. Safe sex- If you may be exposed to sexually transmitted infections, use a condom. 11. Seat belts- Seat belts can save your live; always wear one. 12. Smoke/Carbon Monoxide detectors- These detectors need to be installed on the appropriate level of your home. Replace batteries at least once a year. 13. Skin cancer- When out in the sun please cover up and use sunscreen 15 SPF or higher. 14. Violence- If anyone is threatening or hurting you, please tell your healthcare provider.    Gastroesophageal Reflux Disease, Adult Gastroesophageal reflux disease (GERD) happens when acid from your stomach flows up into the esophagus. When acid comes in contact with the esophagus, the acid causes soreness (inflammation) in the esophagus. Over time,  GERD may create small holes (ulcers) in the lining of the esophagus. CAUSES   Increased body weight. This puts pressure on the stomach, making acid rise from the stomach into the esophagus.  Smoking. This increases acid production in the stomach.  Drinking alcohol. This causes  decreased pressure in the lower esophageal sphincter (valve or ring of muscle between the esophagus and stomach), allowing acid from the stomach into the esophagus.  Late evening meals and a full stomach. This increases pressure and acid production in the stomach.  A malformed lower esophageal sphincter. Sometimes, no cause is found. SYMPTOMS   Burning pain in the lower part of the mid-chest behind the breastbone and in the mid-stomach area. This may occur twice a week or more often.  Trouble swallowing.  Sore throat.  Dry cough.  Asthma-like symptoms including chest tightness, shortness of breath, or wheezing. DIAGNOSIS  Your caregiver may be able to diagnose GERD based on your symptoms. In some cases, X-rays and other tests may be done to check for complications or to check the condition of your stomach and esophagus. TREATMENT  Your caregiver may recommend over-the-counter or prescription medicines to help decrease acid production. Ask your caregiver before starting or adding any new medicines.  HOME CARE INSTRUCTIONS   Change the factors that you can control. Ask your caregiver for guidance concerning weight loss, quitting smoking, and alcohol consumption.  Avoid foods and drinks that make your symptoms worse, such as:  Caffeine or alcoholic drinks.  Chocolate.  Peppermint or mint flavorings.  Garlic and onions.  Spicy foods.  Citrus fruits, such as oranges, lemons, or limes.  Tomato-based foods such as sauce, chili, salsa, and pizza.  Fried and fatty foods.  Avoid lying down for the 3 hours prior to your bedtime or prior to taking a nap.  Eat small, frequent meals instead of large meals.  Wear loose-fitting clothing. Do not wear anything tight around your waist that causes pressure on your stomach.  Raise the head of your bed 6 to 8 inches with wood blocks to help you sleep. Extra pillows will not help.  Only take over-the-counter or prescription medicines for  pain, discomfort, or fever as directed by your caregiver.  Do not take aspirin, ibuprofen, or other nonsteroidal anti-inflammatory drugs (NSAIDs). SEEK IMMEDIATE MEDICAL CARE IF:   You have pain in your arms, neck, jaw, teeth, or back.  Your pain increases or changes in intensity or duration.  You develop nausea, vomiting, or sweating (diaphoresis).  You develop shortness of breath, or you faint.  Your vomit is green, yellow, black, or looks like coffee grounds or blood.  Your stool is red, bloody, or black. These symptoms could be signs of other problems, such as heart disease, gastric bleeding, or esophageal bleeding. MAKE SURE YOU:   Understand these instructions.  Will watch your condition.  Will get help right away if you are not doing well or get worse. Document Released: 03/17/2005 Document Revised: 08/30/2011 Document Reviewed: 12/25/2010 [facility] Patient Information 2014 [place], [place].   Diet for Gastroesophageal Reflux Disease, Adult Reflux (acid reflux) is when acid from your stomach flows up into the esophagus. When acid comes in contact with the esophagus, the acid causes irritation and soreness (inflammation) in the esophagus. When reflux happens often or so severely that it causes damage to the esophagus, it is called gastroesophageal reflux disease (GERD). Nutrition therapy can help ease the discomfort of GERD. FOODS OR DRINKS TO AVOID OR LIMIT  Smoking or chewing  tobacco. Nicotine is one of the most potent stimulants to acid production in the gastrointestinal tract.  Caffeinated and decaffeinated coffee and black tea.  Regular or low-calorie carbonated beverages or energy drinks (caffeine-free carbonated beverages are allowed).   Strong spices, such as black pepper, white pepper, red pepper, cayenne, curry powder, and chili powder.  Peppermint or spearmint.  Chocolate.  High-fat foods, including meats and fried foods. Extra added fats including oils,  butter, salad dressings, and nuts. Limit these to less than 8 tsp per day.  Fruits and vegetables if they are not tolerated, such as citrus fruits or tomatoes.  Alcohol.  Any food that seems to aggravate your condition. If you have questions regarding your diet, call your caregiver or a registered dietitian. OTHER THINGS THAT MAY HELP GERD INCLUDE:   Eating your meals slowly, in a relaxed setting.  Eating 5 to 6 small meals per day instead of 3 large meals.  Eliminating food for a period of time if it causes distress.  Not lying down until 3 hours after eating a meal.  Keeping the head of your bed raised 6 to 9 inches (15 to 23 cm) by using a foam wedge or blocks under the legs of the bed. Lying flat may make symptoms worse.  Being physically active. Weight loss may be helpful in reducing reflux in overweight or obese adults.  Wear loose fitting clothing EXAMPLE MEAL PLAN This meal plan is approximately 2,000 calories based on [employer] meal planning guidelines. Breakfast   cup cooked oatmeal.  1 cup strawberries.  1 cup low-fat milk.  1 oz almonds. Snack  1 cup cucumber slices.  6 oz yogurt (made from low-fat or fat-free milk). Lunch  2 slice whole-wheat bread.  2 oz sliced [place].  2 tsp mayonnaise.  1 cup blueberries.  1 cup snap peas. Snack  6 whole-wheat crackers.  1 oz string cheese. Dinner   cup brown rice.  1 cup mixed veggies.  1 tsp olive oil.  3 oz grilled fish. Document Released: 06/07/2005 Document Revised: 08/30/2011 Document Reviewed: 04/23/2011 [facility] Patient Information 2014 [place], [place].   Get a good multivitamin and take it daily. NATURE MADE is a good brand and they make a multivitamin for young women.

## 2013-09-18 NOTE — Progress Notes (Signed)
Subjective:    Patient ID: Kelsey Hughes, female    DOB: 1980-10-14, 33 y.o.   MRN: [id number]  HPI  This 33 y.o. Cauc female is here to establish care w/ PCP and for CPE/PAP. Last exam 4 years ago; she has been w/o insurance. Her mother is a pt here.   Patient Active Problem List   Diagnosis Date Noted  . Obesity, unspecified 09/18/2013  . ANTERIOR CRUCIATE LIGAMENT TEAR, LEFT KNEE 06/22/1995   PMHX, Surg Hx, Soc and Fam Hx reviewed.   Review of Systems  Constitutional: Negative.   Eyes: Negative.        Exam due this summer. Wears corrective lenses.  Respiratory: Negative.   Cardiovascular: Negative.   Gastrointestinal: Positive for abdominal pain. Negative for nausea, vomiting, blood in stool and abdominal distention.       Intolerant of hot, spicy foods.  Endocrine: Negative.   Musculoskeletal: Negative.   Allergic/Immunologic: Negative.   Neurological: Positive for headaches. Negative for dizziness, syncope, facial asymmetry, weakness and numbness.  Hematological: Negative.   Psychiatric/Behavioral: Negative.       Objective:   Physical Exam  Nursing note and vitals reviewed. Constitutional: She is oriented to person, place, and time. Vital signs are normal. She appears well-developed and well-nourished. No distress.  HENT:  Head: Normocephalic and atraumatic.  Right Ear: Hearing, tympanic membrane, external ear and ear canal normal.  Left Ear: Hearing, tympanic membrane, external ear and ear canal normal.  Nose: Nose normal. No mucosal edema, nasal deformity or septal deviation.  Mouth/Throat: Uvula is midline, oropharynx is clear and moist and mucous membranes are normal. No oral lesions. Normal dentition.  Eyes: Conjunctivae, EOM and lids are normal. Pupils are equal, round, and reactive to light. No scleral icterus.  Fundoscopic exam:      The right eye shows no arteriolar narrowing, no AV nicking and no papilledema. The right eye shows red reflex.       The left  eye shows no arteriolar narrowing, no AV nicking and no papilledema. The left eye shows red reflex.  Neck: Trachea normal, normal range of motion and full passive range of motion without pain. Neck supple. No spinous process tenderness and no muscular tenderness present. No mass and no thyromegaly present.  Cardiovascular: Regular rhythm, S1 normal, S2 normal, normal heart sounds and normal pulses.   No extrasystoles are present. PMI is not displaced.  Exam reveals no gallop and no friction rub.   No murmur heard. Pulmonary/Chest: Effort normal and breath sounds normal. No respiratory distress. She has no wheezes. She has no rhonchi. Right breast exhibits no inverted nipple, no mass, no nipple discharge, no skin change and no tenderness. Left breast exhibits no inverted nipple, no mass, no nipple discharge, no skin change and no tenderness. Breasts are symmetrical.  Abdominal: Soft. Normal appearance and bowel sounds are normal. She exhibits no distension, no pulsatile midline mass and no mass. There is no hepatosplenomegaly. There is tenderness in the epigastric area. There is no rebound, no guarding and no CVA tenderness. No hernia.  Genitourinary: Rectum normal, vagina normal and uterus normal. Pelvic exam was performed with patient supine. There is no rash, tenderness or lesion on the right labia. There is no rash, tenderness or lesion on the left labia. Cervix exhibits no motion tenderness, no discharge and no friability. Right adnexum displays no mass, no tenderness and no fullness. Left adnexum displays no mass, no tenderness and no fullness. No erythema or bleeding around  the vagina. No signs of injury around the vagina. No vaginal discharge found.  Musculoskeletal:       Cervical back: Normal.       Thoracic back: Normal.       Lumbar back: Normal.  Remainder of exam normal.  Lymphadenopathy:       Head (right side): No submental, no submandibular, no tonsillar, no posterior auricular and no  occipital adenopathy present.       Head (left side): No submental, no submandibular, no tonsillar, no posterior auricular and no occipital adenopathy present.    She has no cervical adenopathy.    She has no axillary adenopathy.       Right: No inguinal and no supraclavicular adenopathy present.       Left: No inguinal and no supraclavicular adenopathy present.  Neurological: She is alert and oriented to person, place, and time. She has normal strength. She displays no atrophy and no tremor. No cranial nerve deficit or sensory deficit. She exhibits normal muscle tone. Coordination and gait normal.  Reflex Scores:      Tricep reflexes are 1+ on the right side and 1+ on the left side.      Bicep reflexes are 1+ on the right side and 1+ on the left side.      Brachioradialis reflexes are 1+ on the right side and 1+ on the left side.      Patellar reflexes are 1+ on the right side and 1+ on the left side. Skin: Skin is warm, dry and intact. No bruising, no lesion and no rash noted. She is not diaphoretic. No cyanosis or erythema. No pallor. Nails show no clubbing.  Psychiatric: She has a normal mood and affect. Her speech is normal and behavior is normal. Judgment and thought content normal. Cognition and memory are normal.    Results for orders placed in visit on 09/18/13  POCT URINALYSIS DIPSTICK      Result Value Ref Range   Color, UA yellow     Clarity, UA clear     Glucose, UA neg     Bilirubin, UA neg     Ketones, UA neg     Spec Grav, UA 1.020     Blood, UA neg     pH, UA 7.0     Protein, UA neg     Urobilinogen, UA 0.2     Nitrite, UA neg     Leukocytes, UA Negative    POCT GLYCOSYLATED HEMOGLOBIN (HGB A1C)      Result Value Ref Range   Hemoglobin A1C 5.8         Assessment & Plan:  Routine general medical examination at a health care facility - Pt has borderline A1c/ impaired glucose metabolism; advised nutrition modification and increased physical activity.  Will monitor  A1c in future. Plan: POCT urinalysis dipstick, Vitamin D, 25-hydroxy, LDL Cholesterol, Direct, POCT glycosylated hemoglobin (Hb A1C), COMPLETE METABOLIC PANEL WITH GFR, CBC with Differential  Encounter for cervical Pap smear with pelvic exam - Plan: Pap IG w/ reflex to HPV when ASC-U  GERD (gastroesophageal reflux disease)- RX: Omeprazole 20 mg 1 tablet every morning on empty stomach; anti-reflux guidance and food avoidance.  History of headache - Further evaluation at follow-up visit. Plan: COMPLETE METABOLIC PANEL WITH GFR   Meds ordered this encounter  Medications  . omeprazole (PRILOSEC) 20 MG capsule    Sig: Take 1 capsule (20 mg total) by mouth daily.    Dispense:  30 capsule  Refill:  3

## 2013-09-19 LAB — IRON AND TIBC
Iron: <10 ug/dL — ABNORMAL LOW (ref 42–145)
UIBC: 433 ug/dL — ABNORMAL HIGH (ref 125–400)

## 2013-09-19 LAB — PAP IG W/ RFLX HPV ASCU

## 2013-09-19 LAB — FERRITIN: Ferritin: <1 ng/mL — ABNORMAL LOW (ref 10–291)

## 2013-09-20 ENCOUNTER — Other Ambulatory Visit: Payer: Self-pay | Admitting: Family Medicine

## 2013-09-20 MED ORDER — POLYSACCHARIDE IRON COMPLEX 150 MG PO CAPS: 150.0000 mg | ORAL_CAPSULE | Freq: Two times a day (BID) | ORAL | Status: DC

## 2013-09-20 NOTE — Progress Notes (Signed)
Quick Note:  Please advise pt regarding following labs... You have a moderate anemia and iron is quite low. I am prescribing an iron supplement that you need to take twice a day until I see you next month.  Also try to increase dietary iron; you can find information about this online; look for "Foods rich in Iron".  Copy to pt. ______

## 2013-09-20 NOTE — Progress Notes (Signed)
Quick Note:  Please advise pt regarding following labs... Your metabolic panel (sodium, potassium, calcium, blood sugar, kidney and liver function tests) are normal.  LDL ("bad") cholesterol is elevated. Focus on better nutrition and some regular physical activity for weight loss.  Vitamin D is a little low; get an over-the-counter supplement Vitamin D3 2000 units per capsule and take 1 daily. Try to increase salmon, tuna and other healthy fish, mushrooms and some dairy products to increase Vitamin D in diet.  PAP is negative /normal.  Copy to pt. ______

## 2013-09-21 ENCOUNTER — Encounter: Payer: Self-pay | Admitting: *Deleted

## 2013-10-30 ENCOUNTER — Ambulatory Visit: Payer: Self-pay | Admitting: Family Medicine

## 2013-11-01 ENCOUNTER — Ambulatory Visit (INDEPENDENT_AMBULATORY_CARE_PROVIDER_SITE_OTHER): Payer: No Typology Code available for payment source | Admitting: Family Medicine

## 2013-11-01 ENCOUNTER — Encounter: Payer: Self-pay | Admitting: Family Medicine

## 2013-11-01 VITALS — BP 122/84 | HR 79 | Temp 98.5°F | Resp 16 | Ht 64.0 in | Wt 271.0 lb

## 2013-11-01 DIAGNOSIS — D473 Essential (hemorrhagic) thrombocythemia: Secondary | ICD-10-CM

## 2013-11-01 DIAGNOSIS — Z3009 Encounter for other general counseling and advice on contraception: Secondary | ICD-10-CM

## 2013-11-01 DIAGNOSIS — D75839 Thrombocytosis, unspecified: Secondary | ICD-10-CM

## 2013-11-01 DIAGNOSIS — F5089 Other specified eating disorder: Secondary | ICD-10-CM

## 2013-11-01 DIAGNOSIS — D649 Anemia, unspecified: Secondary | ICD-10-CM

## 2013-11-01 LAB — POCT CBC
Granulocyte percent: 57.3 %G (ref 37–80)
HEMATOCRIT: 31.8 % — AB (ref 37.7–47.9)
HEMOGLOBIN: 9.4 g/dL — AB (ref 12.2–16.2)
Lymph, poc: 2.7 (ref 0.6–3.4)
MCH, POC: 22.8 pg — AB (ref 27–31.2)
MCHC: 29.6 g/dL — AB (ref 31.8–35.4)
MCV: 77.2 fL — AB (ref 80–97)
MID (cbc): 0.5 (ref 0–0.9)
MPV: 7.5 fL (ref 0–99.8)
POC Granulocyte: 4.2 (ref 2–6.9)
POC LYMPH %: 36.1 % (ref 10–50)
POC MID %: 6.6 %M (ref 0–12)
Platelet Count, POC: 527 10*3/uL — AB (ref 142–424)
RBC: 4.12 M/uL (ref 4.04–5.48)
RDW, POC: 17 %
WBC: 7.4 10*3/uL (ref 4.6–10.2)

## 2013-11-01 NOTE — Patient Instructions (Signed)
We have briefly discussed some of the contraception options available to you. You have indicated that you would like to consider oral contraception with extended cycle (where you bleed once every 3 months). Let me know if this is your choice after you give it more thought.  Anemia- your hemoglobin has come up just a little. You have above normal platelet count; this has occurred in the past (> 5 years ago). It would be a good idea t recheck your CBC in 4 weeks (we will send the test out to the reference lab).  I will place an order for you to come in just for lab.    Iron Deficiency Anemia, Adult Anemia is a condition in which there are less red blood cells or hemoglobin in the blood than normal. Hemoglobin is this part of red blood cells that carries oxygen. Iron deficiency anemia is anemia caused by too little iron. It is the most common type of anemia. It may leave you tired and short of breath. CAUSES   Lack of iron in the diet.  Poor absorption of iron, as seen with intestinal disorders.  Intestinal bleeding.  Heavy periods. SIGNS AND SYMPTOMS  Mild anemia may not be noticeable. Symptoms may include:  Fatigue.  Headache.  Pale skin.  Weakness.  Tiredness.  Shortness of breath.  Dizziness.  Cold hands and feet.  Fast or irregular heartbeat. DIAGNOSIS  Diagnosis requires a thorough evaluation and physical exam by your health care provider. Blood tests are generally used to confirm iron deficiency anemia. Additional tests may be done to find the underlying cause of your anemia. These may include:  Testing for blood in the stool (fecal occult blood test).  A procedure to see inside the colon and rectum (colonoscopy).  A procedure to see inside the esophagus and stomach (endoscopy). TREATMENT  Iron deficiency anemia is treated by correcting the cause of the deficiency. Treatment may involve:  Adding iron-rich foods to your diet.  Taking iron supplements. Pregnant or  breastfeeding women need to take extra iron, because their normal diet usually does not provide the required amount.  Taking vitamins. Vitamin C improves the absorption of iron. Your health care provider may recommend taking your iron tablets with a glass of orange juice or vitamin C supplement.  Medicines to make heavy menstrual flow lighter.  Surgery. HOME CARE INSTRUCTIONS   Take iron as directed by your health care provider.  If you cannot tolerate taking iron supplements by mouth, talk to your health care provider about taking them through a vein (intravenously) or an injection into a muscle.  For the best iron absorption, iron supplements should be taken on an empty stomach. If you cannot tolerate them on an empty stomach, you may need to take them with food.  Do not drink milk or take antacids at the same time as your iron supplements. Milk and antacids may interfere with the absorption of iron.  Iron supplements can cause constipation. Make sure to include fiber in your diet to prevent constipation. A stool softener may also be recommended.  Take vitamins as directed by your health care provider.  Eat a diet rich in iron. Foods high in iron include liver, lean beef, whole-grain bread, eggs, dried fruit, and dark green, leafy vegetables. SEEK IMMEDIATE MEDICAL CARE IF:   You faint. If this happens, do not drive. Call your local emergency services (911 in [place] if no other help is available.  You have chest pain.  You feel nauseous  or vomit.  You have severe or increased shortness of breath with activity.  You feel weak.  You have a rapid heartbeat.  You have unexplained sweating.  You become lightheaded when getting up from a chair or bed. MAKE SURE YOU:   Understand these instructions.  Will watch your condition.  Will get help right away if you are not doing well or get worse. Document Released: 06/04/2000 Document Revised: 03/28/2013 Document Reviewed:  02/12/2013 [facility] Patient Information 2014 [place].

## 2013-11-03 ENCOUNTER — Encounter: Payer: Self-pay | Admitting: Family Medicine

## 2013-11-03 DIAGNOSIS — D649 Anemia, unspecified: Secondary | ICD-10-CM | POA: Insufficient documentation

## 2013-11-03 NOTE — Progress Notes (Signed)
S:  This 33 y.o. 48 female returns for follow-up re: HA disorder. Since starting iron supplement. Her HAs have diminished in frequency; she feels much better w/ infrequent episodes of dizziness. Epigastric abd pain has resolved with treatment of GERD; pt takes Omeprazole daily and restricts diet. She does have significant craving for ice; this has caused L-sided "ear pain" (actually TMJ discomfort).  She is here today to discuss contraception. Her significant other, who is serving in [employer], will be home in July. She is interested in starting OCPs; DepoProvera caused weight gain years ago. She is researched NUVARING and is not interested in this form of contraception. She also inquires about an implant form of birth control but is not certain if she is comfortable with something that permanent. Family hx is negative for clotting disorders or significant HA disorder.  Patient Active Problem List   Diagnosis Date Noted  . Obesity, unspecified 09/18/2013  . Impaired glucose metabolism 09/18/2013  . ANTERIOR CRUCIATE LIGAMENT TEAR, LEFT KNEE 06/22/1995   Prior to Admission medications   Medication Sig Start Date End Date Taking? Authorizing Provider  iron polysaccharides (NIFEREX) 150 MG capsule Take 1 capsule (150 mg total) by mouth 2 (two) times daily. 09/20/13  Yes Barton Fanny, MD  omeprazole (PRILOSEC) 20 MG capsule Take 1 capsule (20 mg total) by mouth daily. 09/18/13  Yes Barton Fanny, MD   PMHx, Surg Hx, Soc Hx reviewed.  ROS; As per HPI.  O: Filed Vitals:   11/01/13 1538  BP: 122/84  Pulse: 79  Temp: 98.5 F (36.9 C)  Resp: 16   GEN: in NAD: WN,WD. HENT: [place]/At; EOMI w/ clear conj/sclerae. Ext ears/EACs/TMs normal. Oroph clear and mucosa unremarkable w/o lesions. NECK: Supple w/o LAN or TMG. Posterior neck nontender; normal ROM. COR: RRR. No edema. LUNGS: Normal resp rate and effort. SKIN: W&D; intact w/o erythema, petechiae, rashes or pallor. NEURO: A&O x 3;  Cns intact. Nonfocal.  Results for orders placed in visit on 11/01/13  POCT CBC      Result Value Ref Range   WBC 7.4  4.6 - 10.2 K/uL   Lymph, poc 2.7  0.6 - 3.4   POC LYMPH PERCENT 36.1  10 - 50 %L   MID (cbc) 0.5  0 - 0.9   POC MID % 6.6  0 - 12 %M   POC Granulocyte 4.2  2 - 6.9   Granulocyte percent 57.3  37 - 80 %G   RBC 4.12  4.04 - 5.48 M/uL   Hemoglobin 9.4 (*) 12.2 - 16.2 g/dL   HCT, POC 31.8 (*) 37.7 - 47.9 %   MCV 77.2 (*) 80 - 97 fL   MCH, POC 22.8 (*) 27 - 31.2 pg   MCHC 29.6 (*) 31.8 - 35.4 g/dL   RDW, POC 17.0     Platelet Count, POC 527 (*) 142 - 424 K/uL   MPV 7.5  0 - 99.8 fL    A/P: Encounter for counseling regarding contraception- Pt decides to postpone starting contraception pending repeat lab (CBC).  Anemia - Anemia has not improved significantly.Plan: POCT CBC; continue oral iron supplement.   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  Thrombocytosis - Chart review shows elevated platelet count 4 years ago. Plan: Recheck CBC with Differential in ~ 4-6 weeks.

## 2014-01-10 ENCOUNTER — Other Ambulatory Visit (INDEPENDENT_AMBULATORY_CARE_PROVIDER_SITE_OTHER): Payer: No Typology Code available for payment source | Admitting: Family Medicine

## 2014-01-10 ENCOUNTER — Other Ambulatory Visit: Payer: Self-pay | Admitting: Family Medicine

## 2014-01-10 DIAGNOSIS — D649 Anemia, unspecified: Secondary | ICD-10-CM

## 2014-01-10 DIAGNOSIS — D509 Iron deficiency anemia, unspecified: Secondary | ICD-10-CM

## 2014-01-10 LAB — CBC WITH DIFFERENTIAL/PLATELET
BASOS ABS: 0 10*3/uL (ref 0.0–0.1)
Basophils Relative: 0 % (ref 0–1)
EOS PCT: 2 % (ref 0–5)
Eosinophils Absolute: 0.1 10*3/uL (ref 0.0–0.7)
HEMATOCRIT: 30.9 % — AB (ref 36.0–46.0)
Hemoglobin: 9.4 g/dL — ABNORMAL LOW (ref 12.0–15.0)
Lymphocytes Relative: 28 % (ref 12–46)
Lymphs Abs: 2.1 10*3/uL (ref 0.7–4.0)
MCH: 22.3 pg — ABNORMAL LOW (ref 26.0–34.0)
MCHC: 30.4 g/dL (ref 30.0–36.0)
MCV: 73.2 fL — ABNORMAL LOW (ref 78.0–100.0)
MONO ABS: 0.6 10*3/uL (ref 0.1–1.0)
Monocytes Relative: 8 % (ref 3–12)
Neutro Abs: 4.6 10*3/uL (ref 1.7–7.7)
Neutrophils Relative %: 62 % (ref 43–77)
Platelets: 424 10*3/uL — ABNORMAL HIGH (ref 150–400)
RBC: 4.22 MIL/uL (ref 3.87–5.11)
RDW: 17.4 % — ABNORMAL HIGH (ref 11.5–15.5)
WBC: 7.4 10*3/uL (ref 4.0–10.5)

## 2014-01-10 LAB — IRON: Iron: 20 ug/dL — ABNORMAL LOW (ref 42–145)

## 2014-01-10 MED ORDER — POLYSACCHARIDE IRON COMPLEX 150 MG PO CAPS: 150.0000 mg | ORAL_CAPSULE | Freq: Two times a day (BID) | ORAL | Status: DC

## 2014-02-04 ENCOUNTER — Other Ambulatory Visit: Payer: Self-pay | Admitting: Family Medicine

## 2014-02-15 ENCOUNTER — Telehealth: Payer: Self-pay

## 2014-02-15 MED ORDER — NORGESTIM-ETH ESTRAD TRIPHASIC 0.18/0.215/0.25 MG-25 MCG PO TABS: 1.0000 | ORAL_TABLET | Freq: Every day | ORAL | Status: DC

## 2014-02-15 NOTE — Telephone Encounter (Signed)
[place] - Pt's iron was low when it was tested.  She says that she was supposed to be able to go onto birth control once her levels have came up.  Is she able to do that now?  Please call [phone] if during the day today.  If after call her cell at [phone]

## 2014-02-15 NOTE — Telephone Encounter (Signed)
I spoke w/ pt; I will start birth control pill but she will still need to follow-up with me in 2- 3 months or so. She will still need to take iron. Pt advised that OCP will help reduce heavy bleeding and aid in anemia correction. Menses now so pt to start OCP on Sunday (2 days from now). Needs to use other form of birth control for at least 2 weeks-1 month to insure against pregnancy. She voices understanding.

## 2014-02-18 ENCOUNTER — Telehealth: Payer: Self-pay

## 2014-02-18 NOTE — Telephone Encounter (Signed)
Pt needs a cheaper rx for birth control from dr Leward Quan

## 2014-02-19 MED ORDER — DESOGESTREL-ETHINYL ESTRADIOL 0.15-30 MG-MCG PO TABS: 1.0000 | ORAL_TABLET | Freq: Every day | ORAL | Status: DC

## 2014-02-19 NOTE — Telephone Encounter (Signed)
I have prescribed a different birth control but I have no way of knowing the cost; the pt will have to let me know if this one is too expensive. She should ask the pharmacist what is the cheapest OCP while she is there and they are looking at what her plan covers (if she is using insurance to cover cost of pill). If not, she should ask pharmacist what least expensive pill "out-of-pocket" is.

## 2014-06-12 ENCOUNTER — Other Ambulatory Visit: Payer: Self-pay | Admitting: Family Medicine

## 2015-01-19 ENCOUNTER — Encounter: Payer: Self-pay | Admitting: Emergency Medicine

## 2015-01-19 ENCOUNTER — Emergency Department
Admission: EM | Admit: 2015-01-19 | Discharge: 2015-01-19 | Disposition: A | Discharge status: Home / Self Care | Payer: No Typology Code available for payment source | Attending: Emergency Medicine | Admitting: Emergency Medicine

## 2015-01-19 DIAGNOSIS — J020 Streptococcal pharyngitis: Secondary | ICD-10-CM | POA: Insufficient documentation

## 2015-01-19 DIAGNOSIS — Z793 Long term (current) use of hormonal contraceptives: Secondary | ICD-10-CM | POA: Insufficient documentation

## 2015-01-19 LAB — POCT RAPID STREP A: STREP A AG: POSITIVE — AB

## 2015-01-19 MED ORDER — ONDANSETRON HCL 4 MG PO TABS
ORAL_TABLET | ORAL | Status: AC
  Filled 2015-01-19: qty 1

## 2015-01-19 MED ORDER — LIDOCAINE VISCOUS 2 % MT SOLN: 20.0000 mL | OROMUCOSAL | Status: DC | PRN

## 2015-01-19 MED ORDER — ONDANSETRON HCL 4 MG PO TABS: 4.0000 mg | ORAL_TABLET | Freq: Once | ORAL | Status: DC

## 2015-01-19 MED ORDER — ONDANSETRON 4 MG PO TBDP
ORAL_TABLET | ORAL | Status: AC
  Administered 2015-01-19: 4 mg via ORAL
  Filled 2015-01-19: qty 1

## 2015-01-19 MED ORDER — ONDANSETRON 4 MG PO TBDP
4.0000 mg | ORAL_TABLET | Freq: Once | ORAL | Status: AC
  Administered 2015-01-19: 4 mg via ORAL

## 2015-01-19 MED ORDER — AMOXICILLIN 875 MG PO TABS: 875.0000 mg | ORAL_TABLET | Freq: Two times a day (BID) | ORAL | Status: DC

## 2015-01-19 MED ORDER — DEXAMETHASONE 1 MG/ML PO CONC
ORAL | Status: AC
  Administered 2015-01-19: 10 mg via ORAL
  Filled 2015-01-19: qty 1

## 2015-01-19 MED ORDER — DEXAMETHASONE 1 MG/ML PO CONC
10.0000 mg | Freq: Once | ORAL | Status: AC
Start: 2015-01-19 — End: 2015-01-19
  Administered 2015-01-19: 10 mg via ORAL

## 2015-01-19 MED ORDER — NAPROXEN 250 MG PO TABS: 250.0000 mg | ORAL_TABLET | Freq: Two times a day (BID) | ORAL | Status: DC

## 2015-01-19 NOTE — Discharge Instructions (Signed)
Pharyngitis Pharyngitis is redness, pain, and swelling (inflammation) of your pharynx.  CAUSES  Pharyngitis is usually caused by infection. Most of the time, these infections are from viruses (viral) and are part of a cold. However, sometimes pharyngitis is caused by bacteria (bacterial). Pharyngitis can also be caused by allergies. Viral pharyngitis may be spread from person to person by coughing, sneezing, and personal items or utensils (cups, forks, spoons, toothbrushes). Bacterial pharyngitis may be spread from person to person by more intimate contact, such as kissing.  SIGNS AND SYMPTOMS  Symptoms of pharyngitis include:   Sore throat.   Tiredness (fatigue).   Low-grade fever.   Headache.  Joint pain and muscle aches.  Skin rashes.  Swollen lymph nodes.  Plaque-like film on throat or tonsils (often seen with bacterial pharyngitis). DIAGNOSIS  Your health care provider will ask you questions about your illness and your symptoms. Your medical history, along with a physical exam, is often all that is needed to diagnose pharyngitis. Sometimes, a rapid strep test is done. Other lab tests may also be done, depending on the suspected cause.  TREATMENT  Viral pharyngitis will usually get better in 3-4 days without the use of medicine. Bacterial pharyngitis is treated with medicines that kill germs (antibiotics).  HOME CARE INSTRUCTIONS   Drink enough water and fluids to keep your urine clear or pale yellow.   Only take over-the-counter or prescription medicines as directed by your health care provider:   If you are prescribed antibiotics, make sure you finish them even if you start to feel better.   Do not take aspirin.   Get lots of rest.   Gargle with 8 oz of salt water ( tsp of salt per 1 qt of water) as often as every 1-2 hours to soothe your throat.   Throat lozenges (if you are not at risk for choking) or sprays may be used to soothe your throat. SEEK MEDICAL  CARE IF:   You have large, tender lumps in your neck.  You have a rash.  You cough up green, yellow-brown, or bloody spit. SEEK IMMEDIATE MEDICAL CARE IF:   Your neck becomes stiff.  You drool or are unable to swallow liquids.  You vomit or are unable to keep medicines or liquids down.  You have severe pain that does not go away with the use of recommended medicines.  You have trouble breathing (not caused by a stuffy nose). MAKE SURE YOU:   Understand these instructions.  Will watch your condition.  Will get help right away if you are not doing well or get worse. Document Released: 06/07/2005 Document Revised: 03/28/2013 Document Reviewed: 02/12/2013 [facility] Patient Information 2015 [place], [place]. This information is not intended to replace advice given to you by your health care provider. Make sure you discuss any questions you have with your health care provider.  Strep Throat Strep throat is an infection of the throat caused by a bacteria named Streptococcus pyogenes. Your health care provider may call the infection streptococcal "tonsillitis" or "pharyngitis" depending on whether there are signs of inflammation in the tonsils or back of the throat. Strep throat is most common in children aged 5-15 years during the cold months of the year, but it can occur in people of any age during any season. This infection is spread from person to person (contagious) through coughing, sneezing, or other close contact. SIGNS AND SYMPTOMS   Fever or chills.  Painful, swollen, red tonsils or throat.  Pain or difficulty  when swallowing.  White or yellow spots on the tonsils or throat.  Swollen, tender lymph nodes or "glands" of the neck or under the jaw.  Red rash all over the body (rare). DIAGNOSIS  Many different infections can cause the same symptoms. A test must be done to confirm the diagnosis so the right treatment can be given. A "rapid strep test" can help your health care  provider make the diagnosis in a few minutes. If this test is not available, a light swab of the infected area can be used for a throat culture test. If a throat culture test is done, results are usually available in a day or two. TREATMENT  Strep throat is treated with antibiotic medicine. HOME CARE INSTRUCTIONS   Gargle with 1 tsp of salt in 1 cup of warm water, 3-4 times per day or as needed for comfort.  Family members who also have a sore throat or fever should be tested for strep throat and treated with antibiotics if they have the strep infection.  Make sure everyone in your household washes their hands well.  Do not share food, drinking cups, or personal items that could cause the infection to spread to others.  You may need to eat a soft food diet until your sore throat gets better.  Drink enough water and fluids to keep your urine clear or pale yellow. This will help prevent dehydration.  Get plenty of rest.  Stay home from school, day care, or work until you have been on antibiotics for 24 hours.  Take medicines only as directed by your health care provider.  Take your antibiotic medicine as directed by your health care provider. Finish it even if you start to feel better. SEEK MEDICAL CARE IF:   The glands in your neck continue to enlarge.  You develop a rash, cough, or earache.  You cough up green, yellow-brown, or bloody sputum.  You have pain or discomfort not controlled by medicines.  Your problems seem to be getting worse rather than better.  You have a fever. SEEK IMMEDIATE MEDICAL CARE IF:   You develop any new symptoms such as vomiting, severe headache, stiff or painful neck, chest pain, shortness of breath, or trouble swallowing.  You develop severe throat pain, drooling, or changes in your voice.  You develop swelling of the neck, or the skin on the neck becomes red and tender.  You develop signs of dehydration, such as fatigue, dry mouth, and  decreased urination.  You become increasingly sleepy, or you cannot wake up completely. MAKE SURE YOU:  Understand these instructions.  Will watch your condition.  Will get help right away if you are not doing well or get worse. Document Released: 06/04/2000 Document Revised: 10/22/2013 Document Reviewed: 08/06/2010 [facility] Patient Information 2015 [place], [place]. This information is not intended to replace advice given to you by your health care provider. Make sure you discuss any questions you have with your health care provider.

## 2015-01-19 NOTE — ED Provider Notes (Signed)
[facility] Emergency Department Provider Note  ____________________________________________  Time seen: 5:00 AM  I have reviewed the triage vital signs and the nursing notes.   HISTORY  Chief Complaint Sore Throat    HPI Kelsey Hughes is a 34 y.o. female who complains of sore throat and fever since yesterday morning when she woke up at 8:00 AM. When she went to bed 2 days ago in the evening she was in her usual state of health with no symptoms. Denies any recent illness. With this she is having some stomach upset and nausea but no vomiting. She's had decreased appetite and not eating much but she is drinking fluids.  She has 2 children at home a 28-year-old and a 43 year old   History reviewed. No pertinent past medical history.  Patient Active Problem List   Diagnosis Date Noted  . Anemia 11/03/2013  . Obesity, unspecified 09/18/2013  . Impaired glucose metabolism 09/18/2013  . ANTERIOR CRUCIATE LIGAMENT TEAR, LEFT KNEE 06/22/1995    History reviewed. No pertinent past surgical history.  Current Outpatient Rx  Name  Route  Sig  Dispense  Refill  . amoxicillin (AMOXIL) 875 MG tablet   Oral   Take 1 tablet (875 mg total) by mouth 2 (two) times daily.   20 tablet   0   . desogestrel-ethinyl estradiol (APRI,EMOQUETTE,SOLIA) 0.15-30 MG-MCG tablet   Oral   Take 1 tablet by mouth daily.   1 Package   5   . iron polysaccharides (NIFEREX) 150 MG capsule   Oral   Take 1 capsule (150 mg total) by mouth 2 (two) times daily.   60 capsule   2   . lidocaine (XYLOCAINE) 2 % solution   Mouth/Throat   Use as directed 20 mLs in the mouth or throat every 2 (two) hours as needed for mouth pain. Gargle and spit out   100 mL   0   . naproxen (NAPROSYN) 250 MG tablet   Oral   Take 1 tablet (250 mg total) by mouth 2 (two) times daily with a meal.   40 tablet   0   . omeprazole (PRILOSEC) 20 MG capsule   Oral   Take 1 capsule (20 mg total) by mouth  daily. PATIENT NEEDS OFFICE VISIT FOR ADDITIONAL REFILLS   30 capsule   0     Allergies Sulfonamide derivatives  History reviewed. No pertinent family history.  Social History History  Substance Use Topics  . Smoking status: Never Smoker   . Smokeless tobacco: Not on file  . Alcohol Use: No    Review of Systems  Constitutional: Positive fever. No weight changes Eyes:No blurry vision or double vision.  ENT: Positive sore throat. Cardiovascular: No chest pain. Respiratory: No dyspnea or cough. Gastrointestinal: Negative for abdominal pain, vomiting and diarrhea.  No BRBPR or melena. Genitourinary: Negative for dysuria, urinary retention, bloody urine, or difficulty urinating. Musculoskeletal: Negative for back pain. No joint swelling or pain. Skin: Negative for rash. Neurological: Negative for headaches, focal weakness or numbness. Psychiatric:No anxiety or depression.   Endocrine:No hot/cold intolerance, changes in energy, or sleep difficulty.  10-point ROS otherwise negative.  ____________________________________________   PHYSICAL EXAM:  VITAL SIGNS: ED Triage Vitals  Enc Vitals Group     BP 01/19/15 0310 111/69 mmHg     Pulse Rate 01/19/15 0310 130     Resp 01/19/15 0310 20     Temp 01/19/15 0310 99.2 F (37.3 C)     Temp Source  01/19/15 0310 Oral     SpO2 01/19/15 0310 94 %     Weight 01/19/15 0310 245 lb (111.131 kg)     Height 01/19/15 0310 5\' 6"  (1.676 m)     Head Cir --      Peak Flow --      Pain Score 01/19/15 0311 9     Pain Loc --      Pain Edu? --      Excl. in [place]? --      Constitutional: Alert and oriented. Well appearing and in no distress. Eyes: No scleral icterus. No conjunctival pallor. PERRL. EOMI ENT   Head: Normocephalic and atraumatic.   Nose: No congestion/rhinnorhea. No septal hematoma   Mouth/Throat: MMM, positive pharyngeal erythema with palatal petechiae. No peritonsillar mass. No uvula shift.   Neck: No  stridor. No SubQ emphysema. No meningismus. Hematological/Lymphatic/Immunilogical: Positive left cervical lymphadenopathy. Cardiovascular: Tachycardia heart rate 120. Normal and symmetric distal pulses are present in all extremities. No murmurs, rubs, or gallops. Respiratory: Normal respiratory effort without tachypnea nor retractions. Breath sounds are clear and equal bilaterally. No wheezes/rales/rhonchi. Gastrointestinal: Soft with slight left or quadrant tenderness. No distention. There is no CVA tenderness.  No rebound, rigidity, or guarding. Genitourinary: deferred Musculoskeletal: Nontender with normal range of motion in all extremities. No joint effusions.  No lower extremity tenderness.  No edema. Neurologic:   Normal speech and language.  CN 2-10 normal. Motor grossly intact. No pronator drift.  Normal gait. No gross focal neurologic deficits are appreciated.  Skin:  Skin is warm, dry and intact. No rash noted.  No petechiae, purpura, or bullae. Psychiatric: Mood and affect are normal. Speech and behavior are normal. Patient exhibits appropriate insight and judgment.  ____________________________________________    LABS (pertinent positives/negatives) (all labs ordered are listed, but only abnormal results are displayed) Labs Reviewed - No data to display _________. CT rapid strep test positive ___________________________________   EKG    ____________________________________________    RADIOLOGY    ____________________________________________   PROCEDURES  ____________________________________________   INITIAL IMPRESSION / ASSESSMENT AND PLAN / ED COURSE  Pertinent labs & imaging results that were available during my care of the patient were reviewed by me and considered in my medical decision making (see chart for details).  Patient presents with clinical pharyngitis. Rapid strep is positive. Because she has children at home, we'll treat her with  amoxicillin. No evidence of RPA or PTA or sepsis. Low suspicion for vascular complication. Patient is tolerating oral intake, we'll give her a dose of Decadron here as well as viscous lidocaine and amoxicillin and naproxen prescriptions.  ____________________________________________   FINAL CLINICAL IMPRESSION(S) / ED DIAGNOSES  Final diagnoses:  Pharyngitis due to group A beta hemolytic Streptococci      Carrie Mew, MD 01/19/15 [phone]

## 2015-01-19 NOTE — ED Notes (Signed)
Pt presents to ER alert and in NAD. Pt states sore throat x 1 day. Pt reports fever at home. All triage notes done by Pryor Curia RN.

## 2015-01-20 LAB — POCT RAPID STREP A: Streptococcus, Group A Screen (Direct): POSITIVE — AB

## 2015-01-25 ENCOUNTER — Emergency Department
Admission: EM | Admit: 2015-01-25 | Discharge: 2015-01-25 | Disposition: A | Discharge status: Home / Self Care | Payer: No Typology Code available for payment source | Attending: Emergency Medicine | Admitting: Emergency Medicine

## 2015-01-25 ENCOUNTER — Encounter: Payer: Self-pay | Admitting: Emergency Medicine

## 2015-01-25 DIAGNOSIS — Z791 Long term (current) use of non-steroidal anti-inflammatories (NSAID): Secondary | ICD-10-CM | POA: Insufficient documentation

## 2015-01-25 DIAGNOSIS — N9089 Other specified noninflammatory disorders of vulva and perineum: Secondary | ICD-10-CM | POA: Insufficient documentation

## 2015-01-25 DIAGNOSIS — Z3202 Encounter for pregnancy test, result negative: Secondary | ICD-10-CM | POA: Insufficient documentation

## 2015-01-25 DIAGNOSIS — N390 Urinary tract infection, site not specified: Secondary | ICD-10-CM | POA: Insufficient documentation

## 2015-01-25 DIAGNOSIS — Z79899 Other long term (current) drug therapy: Secondary | ICD-10-CM | POA: Insufficient documentation

## 2015-01-25 DIAGNOSIS — Z792 Long term (current) use of antibiotics: Secondary | ICD-10-CM | POA: Insufficient documentation

## 2015-01-25 LAB — WET PREP, GENITAL
CLUE CELLS WET PREP: NONE SEEN
Trich, Wet Prep: NONE SEEN
Yeast Wet Prep HPF POC: NONE SEEN

## 2015-01-25 LAB — URINALYSIS COMPLETE WITH MICROSCOPIC (ARMC ONLY)
Bilirubin Urine: NEGATIVE
Glucose, UA: NEGATIVE mg/dL
KETONES UR: NEGATIVE mg/dL
Nitrite: NEGATIVE
PH: 6 (ref 5.0–8.0)
PROTEIN: 30 mg/dL — AB
Specific Gravity, Urine: 1.013 (ref 1.005–1.030)

## 2015-01-25 LAB — CHLAMYDIA/NGC RT PCR (ARMC ONLY)
CHLAMYDIA TR: NOT DETECTED
N gonorrhoeae: NOT DETECTED

## 2015-01-25 LAB — POCT PREGNANCY, URINE: Preg Test, Ur: NEGATIVE

## 2015-01-25 MED ORDER — FLUCONAZOLE 150 MG PO TABS: 150.0000 mg | ORAL_TABLET | Freq: Once | ORAL | Status: DC

## 2015-01-25 MED ORDER — NITROFURANTOIN MONOHYD MACRO 100 MG PO CAPS: 100.0000 mg | ORAL_CAPSULE | Freq: Two times a day (BID) | ORAL | Status: DC

## 2015-01-25 MED ORDER — CLINDAMYCIN HCL 300 MG PO CAPS: 300.0000 mg | ORAL_CAPSULE | Freq: Four times a day (QID) | ORAL | Status: DC

## 2015-01-25 NOTE — ED Provider Notes (Signed)
[facility] Emergency Department Provider Note ____________________________________________  Time seen: 1652  I have reviewed the triage vital signs and the nursing notes.  HISTORY  Chief Complaint   Vaginal Discharge  HPI Kelsey Hughes is a 34 y.o. female reports to the ED for evaluation management of a vaginal discharge that she started over the last3 days. She has concerns because she knows that her female partner had a nonproductive sex with a another female, and she has had unprotected sex with him in the interim. She denies any fever, chills, sweats, nausea, vomiting, dizziness. She also notes some irritation, itching, and burning to the external vulva, which is worsened with wiping, and pain. She is without urinary symptoms.   History reviewed. No pertinent past medical history.  Patient Active Problem List   Diagnosis Date Noted  . Anemia 11/03/2013  . Obesity, unspecified 09/18/2013  . Impaired glucose metabolism 09/18/2013  . ANTERIOR CRUCIATE LIGAMENT TEAR, LEFT KNEE 06/22/1995    History reviewed. No pertinent past surgical history.  Current Outpatient Rx  Name  Route  Sig  Dispense  Refill  . amoxicillin (AMOXIL) 875 MG tablet   Oral   Take 1 tablet (875 mg total) by mouth 2 (two) times daily.   20 tablet   0   . clindamycin (CLEOCIN) 300 MG capsule   Oral   Take 1 capsule (300 mg total) by mouth 4 (four) times daily.   40 capsule   0   . desogestrel-ethinyl estradiol (APRI,EMOQUETTE,SOLIA) 0.15-30 MG-MCG tablet   Oral   Take 1 tablet by mouth daily.   1 Package   5   . fluconazole (DIFLUCAN) 150 MG tablet   Oral   Take 1 tablet (150 mg total) by mouth once.   1 tablet   0   . iron polysaccharides (NIFEREX) 150 MG capsule   Oral   Take 1 capsule (150 mg total) by mouth 2 (two) times daily.   60 capsule   2   . lidocaine (XYLOCAINE) 2 % solution   Mouth/Throat   Use as directed 20 mLs in the mouth or throat every 2 (two)  hours as needed for mouth pain. Gargle and spit out   100 mL   0   . naproxen (NAPROSYN) 250 MG tablet   Oral   Take 1 tablet (250 mg total) by mouth 2 (two) times daily with a meal.   40 tablet   0   . nitrofurantoin, macrocrystal-monohydrate, (MACROBID) 100 MG capsule   Oral   Take 1 capsule (100 mg total) by mouth 2 (two) times daily.   14 capsule   0   . omeprazole (PRILOSEC) 20 MG capsule   Oral   Take 1 capsule (20 mg total) by mouth daily. PATIENT NEEDS OFFICE VISIT FOR ADDITIONAL REFILLS   30 capsule   0    Allergies Sulfonamide derivatives  History reviewed. No pertinent family history.  Social History History  Substance Use Topics  . Smoking status: Never Smoker   . Smokeless tobacco: Not on file  . Alcohol Use: No   Review of Systems  Constitutional: Negative for fever. Eyes: Negative for visual changes. ENT: Negative for sore throat. Cardiovascular: Negative for chest pain. Respiratory: Negative for shortness of breath. Gastrointestinal: Negative for abdominal pain, vomiting and diarrhea. Genitourinary: Negative for dysuria. Reports vaginal discharge and vulvar irritation.  Musculoskeletal: Negative for back pain. Skin: Negative for rash. Neurological: Negative for headaches, focal weakness or numbness. ____________________________________________  PHYSICAL EXAM:  VITAL SIGNS: ED Triage Vitals  Enc Vitals Group     BP 01/25/15 1632 129/87 mmHg     Pulse Rate 01/25/15 1632 96     Resp 01/25/15 1632 18     Temp 01/25/15 1632 98.1 F (36.7 C)     Temp Source 01/25/15 1632 Oral     SpO2 01/25/15 1632 97 %     Weight 01/25/15 1632 244 lb (110.678 kg)     Height 01/25/15 1632 5\' 6"  (1.676 m)     Head Cir --      Peak Flow --      Pain Score --      Pain Loc --      Pain Edu? --      Excl. in [place]? --    Constitutional: Alert and oriented. Well appearing and in no distress. Eyes: Conjunctivae are normal. PERRL. Normal extraocular  movements. ENT   Head: Normocephalic and atraumatic.   Nose: No congestion/rhinnorhea.   Mouth/Throat: Mucous membranes are moist.   Neck: Supple. No thyromegaly. Hematological/Lymphatic/Immunilogical: No cervical lymphadenopathy. Cardiovascular: Normal rate, regular rhythm.  Respiratory: Normal respiratory effort. No wheezes/rales/rhonchi. Gastrointestinal: Soft and nontender. No distention. GU: Normal external genitalia except for scattered, shallow, erythematous ulcerations over the external vulva, labia minora, and extending into the vaginal canal on speculum exam. Thin, grayish discharge noted in the canal. Cervix closed. No CMT. No adnexal masses.  Musculoskeletal: Nontender with normal range of motion in all extremities.  Neurologic:  Normal gait without ataxia. Normal speech and language. No gross focal neurologic deficits are appreciated. Skin:  Skin is warm, dry and intact. No rash noted. Psychiatric: Mood and affect are normal. Patient exhibits appropriate insight and judgment. ____________________________________________   LABS (pertinent positives/negatives) Labs Reviewed  WET PREP, GENITAL - Abnormal; Notable for the following:    WBC, Wet Prep HPF POC MODERATE (*)    All other components within normal limits  URINALYSIS COMPLETEWITH MICROSCOPIC ([facility] ONLY) - Abnormal; Notable for the following:    Color, Urine YELLOW (*)    APPearance HAZY (*)    Hgb urine dipstick 1+ (*)    Protein, ur 30 (*)    Leukocytes, UA 3+ (*)    Bacteria, UA RARE (*)    Squamous Epithelial / LPF 6-30 (*)    All other components within normal limits  CHLAMYDIA/NGC RT PCR ([facility] ONLY)  POC URINE PREG, ED  POCT PREGNANCY, URINE  ____________________________________________  INITIAL IMPRESSION / ASSESSMENT AND PLAN / ED COURSE  Treatment with clindamycin for vulvar lesions/cellulitis. Lesions may be staph infection due to shaving & friction. GC culture pending. Will  treat UTI with macrodantin. Continue topical barrier cream for comfort. Follow-up with primary provider as needed.  ____________________________________________  FINAL CLINICAL IMPRESSION(S) / ED DIAGNOSES  Final diagnoses:  UTI (lower urinary tract infection)  Vulvar lesion     Melvenia Needles, PA-C 01/25/15 1915

## 2015-01-25 NOTE — Discharge Instructions (Signed)
Urinary Tract Infection Urinary tract infections (UTIs) can develop anywhere along your urinary tract. Your urinary tract is your body's drainage system for removing wastes and extra water. Your urinary tract includes two kidneys, two ureters, a bladder, and a urethra. Your kidneys are a pair of bean-shaped organs. Each kidney is about the size of your fist. They are located below your ribs, one on each side of your spine. CAUSES Infections are caused by microbes, which are microscopic organisms, including fungi, viruses, and bacteria. These organisms are so small that they can only be seen through a microscope. Bacteria are the microbes that most commonly cause UTIs. SYMPTOMS  Symptoms of UTIs may vary by age and gender of the patient and by the location of the infection. Symptoms in young women typically include a frequent and intense urge to urinate and a painful, burning feeling in the bladder or urethra during urination. Older women and men are more likely to be tired, shaky, and weak and have muscle aches and abdominal pain. A fever may mean the infection is in your kidneys. Other symptoms of a kidney infection include pain in your back or sides below the ribs, nausea, and vomiting. DIAGNOSIS To diagnose a UTI, your caregiver will ask you about your symptoms. Your caregiver also will ask to provide a urine sample. The urine sample will be tested for bacteria and white blood cells. White blood cells are made by your body to help fight infection. TREATMENT  Typically, UTIs can be treated with medication. Because most UTIs are caused by a bacterial infection, they usually can be treated with the use of antibiotics. The choice of antibiotic and length of treatment depend on your symptoms and the type of bacteria causing your infection. HOME CARE INSTRUCTIONS  If you were prescribed antibiotics, take them exactly as your caregiver instructs you. Finish the medication even if you feel better after you  have only taken some of the medication.  Drink enough water and fluids to keep your urine clear or pale yellow.  Avoid caffeine, tea, and carbonated beverages. They tend to irritate your bladder.  Empty your bladder often. Avoid holding urine for long periods of time.  Empty your bladder before and after sexual intercourse.  After a bowel movement, women should cleanse from front to back. Use each tissue only once. SEEK MEDICAL CARE IF:   You have back pain.  You develop a fever.  Your symptoms do not begin to resolve within 3 days. SEEK IMMEDIATE MEDICAL CARE IF:   You have severe back pain or lower abdominal pain.  You develop chills.  You have nausea or vomiting.  You have continued burning or discomfort with urination. MAKE SURE YOU:   Understand these instructions.  Will watch your condition.  Will get help right away if you are not doing well or get worse. Document Released: 03/17/2005 Document Revised: 12/07/2011 Document Reviewed: 07/16/2011 [facility] Patient Information 2015 [place], [place]. This information is not intended to replace advice given to you by your health care provider. Make sure you discuss any questions you have with your health care provider.  You are being treated for a UTI as well as vulvar infection likely due to a staph infection.  Continue to use the A&D ointment for barrier. Take the antibiotics as directed.  Follow-up with the health department or this department as needed. You may receive a call regarding your pending lab results.

## 2015-01-25 NOTE — ED Notes (Signed)
Patient to ED with report of vaginal itching and burning.

## 2015-01-25 NOTE — ED Notes (Signed)
Third day of vaginal discharge. Clear and white discharge. Unsure if partner has an STD. States he slept with someone else.

## 2015-01-28 ENCOUNTER — Emergency Department
Admission: EM | Admit: 2015-01-28 | Discharge: 2015-01-28 | Disposition: A | Discharge status: Home / Self Care | Payer: No Typology Code available for payment source | Attending: Emergency Medicine | Admitting: Emergency Medicine

## 2015-01-28 ENCOUNTER — Encounter: Payer: Self-pay | Admitting: Emergency Medicine

## 2015-01-28 DIAGNOSIS — A609 Anogenital herpesviral infection, unspecified: Secondary | ICD-10-CM | POA: Insufficient documentation

## 2015-01-28 DIAGNOSIS — Z79899 Other long term (current) drug therapy: Secondary | ICD-10-CM | POA: Insufficient documentation

## 2015-01-28 DIAGNOSIS — Z792 Long term (current) use of antibiotics: Secondary | ICD-10-CM | POA: Insufficient documentation

## 2015-01-28 DIAGNOSIS — Z793 Long term (current) use of hormonal contraceptives: Secondary | ICD-10-CM | POA: Insufficient documentation

## 2015-01-28 DIAGNOSIS — A6000 Herpesviral infection of urogenital system, unspecified: Secondary | ICD-10-CM

## 2015-01-28 MED ORDER — VALACYCLOVIR HCL 500 MG PO TABS
1000.0000 mg | ORAL_TABLET | Freq: Once | ORAL | Status: AC
  Administered 2015-01-28: 1000 mg via ORAL
  Filled 2015-01-28: qty 2

## 2015-01-28 MED ORDER — LIDOCAINE HCL 2 % EX GEL
1.0000 "application " | Freq: Once | CUTANEOUS | Status: AC
  Administered 2015-01-28: 1 via URETHRAL
  Filled 2015-01-28: qty 5

## 2015-01-28 MED ORDER — VALACYCLOVIR HCL 1 G PO TABS: 1000.0000 mg | ORAL_TABLET | Freq: Two times a day (BID) | ORAL | Status: AC

## 2015-01-28 NOTE — ED Provider Notes (Signed)
[facility] Emergency Department Provider Note  ____________________________________________  Time seen: Approximately 456 AM  I have reviewed the triage vital signs and the nursing notes.   HISTORY  Chief Complaint Vaginal Pain and Groin Swelling    HPI Kelsey Hughes is a 34 y.o. female who was here on Saturday and diagnosed with a vaginal bacterial infection. The patient reports that she's been on antibiotics for 2 days but her vagina has been painful and looks raw. The patient reports that she also started her period so now she is having lots of vaginal bleeding. The patient reports that she initially had some lesions but now they seem to be everywhere and is very raw and painful. The patient reports that hurts to urinate and she only urinated 3 times yesterday. She has been taking Tylenol for cramps but reports that she doesn't think it helps with the pain. Her pain is a 67 out of 10 in intensity. She reports that she was not told to follow-up in that the antibiotic would help the symptoms. The patient is concerned so she decided to come in for evaluation. She is on amoxicillin, clindamycin and Macrobid.   History reviewed. No pertinent past medical history.  Patient Active Problem List   Diagnosis Date Noted  . Anemia 11/03/2013  . Obesity, unspecified 09/18/2013  . Impaired glucose metabolism 09/18/2013  . ANTERIOR CRUCIATE LIGAMENT TEAR, LEFT KNEE 06/22/1995    History reviewed. No pertinent past surgical history.  Current Outpatient Rx  Name  Route  Sig  Dispense  Refill  . amoxicillin (AMOXIL) 875 MG tablet   Oral   Take 1 tablet (875 mg total) by mouth 2 (two) times daily.   20 tablet   0   . clindamycin (CLEOCIN) 300 MG capsule   Oral   Take 1 capsule (300 mg total) by mouth 4 (four) times daily.   40 capsule   0   . desogestrel-ethinyl estradiol (APRI,EMOQUETTE,SOLIA) 0.15-30 MG-MCG tablet   Oral   Take 1 tablet by mouth daily.   1  Package   5   . fluconazole (DIFLUCAN) 150 MG tablet   Oral   Take 1 tablet (150 mg total) by mouth once.   1 tablet   0   . iron polysaccharides (NIFEREX) 150 MG capsule   Oral   Take 1 capsule (150 mg total) by mouth 2 (two) times daily.   60 capsule   2   . lidocaine (XYLOCAINE) 2 % solution   Mouth/Throat   Use as directed 20 mLs in the mouth or throat every 2 (two) hours as needed for mouth pain. Gargle and spit out   100 mL   0   . naproxen (NAPROSYN) 250 MG tablet   Oral   Take 1 tablet (250 mg total) by mouth 2 (two) times daily with a meal.   40 tablet   0   . nitrofurantoin, macrocrystal-monohydrate, (MACROBID) 100 MG capsule   Oral   Take 1 capsule (100 mg total) by mouth 2 (two) times daily.   14 capsule   0   . omeprazole (PRILOSEC) 20 MG capsule   Oral   Take 1 capsule (20 mg total) by mouth daily. PATIENT NEEDS OFFICE VISIT FOR ADDITIONAL REFILLS   30 capsule   0   . valACYclovir (VALTREX) 1000 MG tablet   Oral   Take 1 tablet (1,000 mg total) by mouth 2 (two) times daily.   14 tablet   0  Allergies Sulfonamide derivatives  No family history on file.  Social History History  Substance Use Topics  . Smoking status: Never Smoker   . Smokeless tobacco: Not on file  . Alcohol Use: Yes     Comment: socially     Review of Systems Constitutional: No fever/chills Eyes: No visual changes. ENT: No sore throat. Cardiovascular: Denies chest pain. Respiratory: Denies shortness of breath. Gastrointestinal: No abdominal pain.  No nausea, no vomiting.  No diarrhea.  No constipation. Genitourinary:  Dysuria, vaginal pain Musculoskeletal: Negative for back pain. Skin: Negative for rash. Neurological: Negative for headaches, focal weakness or numbness.  10-point ROS otherwise negative.  ____________________________________________   PHYSICAL EXAM:  VITAL SIGNS: ED Triage Vitals  Enc Vitals Group     BP 01/28/15 0145 141/92 mmHg      Pulse Rate 01/28/15 0145 82     Resp 01/28/15 0145 19     Temp 01/28/15 0145 97.9 F (36.6 C)     Temp Source 01/28/15 0145 Oral     SpO2 01/28/15 0145 94 %     Weight 01/28/15 0145 244 lb (110.678 kg)     Height 01/28/15 0145 5\' 6"  (1.676 m)     Head Cir --      Peak Flow --      Pain Score 01/28/15 0147 9     Pain Loc --      Pain Edu? --      Excl. in [place]? --     Constitutional: Alert and oriented. Well appearing and in moderate distress. Eyes: Conjunctivae are normal. PERRL. EOMI. Head: Atraumatic. Nose: No congestion/rhinnorhea. Mouth/Throat: Mucous membranes are moist.  Oropharynx non-erythematous. Cardiovascular: Normal rate, regular rhythm. Grossly normal heart sounds.  Good peripheral circulation. Respiratory: Normal respiratory effort.  No retractions. Lungs CTAB. Gastrointestinal: Soft and nontender. No distention. Positive bowel sounds Genitourinary: Multiple vaginal ulcerations around the patient's labia and clitoris. Very tender to palpation Musculoskeletal: No lower extremity tenderness nor edema.   Neurologic:  Normal speech and language. No gross focal neurologic deficits are appreciated. No gait instability. Skin:  Skin is warm, dry and intact. No rash noted. Psychiatric: Mood and affect are normal.   ____________________________________________   LABS (all labs ordered are listed, but only abnormal results are displayed)  Labs Reviewed  CYTOLOGY - NON PAP   ____________________________________________  EKG  None ____________________________________________  RADIOLOGY  None ____________________________________________   PROCEDURES  Procedure(s) performed: None  Critical Care performed: No  ____________________________________________   INITIAL IMPRESSION / ASSESSMENT AND PLAN / ED COURSE  Pertinent labs & imaging results that were available during my care of the patient were reviewed by me and considered in my medical decision making (see  chart for details).  The patient is a 34 year old female who comes in with vaginal pain and ulcerations. The patient is very tender over these ulcerations with a concern for possible genital herpes infection. I did place some lidocaine jelly on the patient's ulcers for pain control as well as swab the ulcers for testing. I gave the patient a dose of Valtrex and will discharge her with Valtrex as well. I explained to the patient that genital herpes can be contracted even if the patient does not have symptoms. The patient is tearful but she will follow-up with OB/GYN. ____________________________________________   FINAL CLINICAL IMPRESSION(S) / ED DIAGNOSES  Final diagnoses:  Genital herpes      Loney Hering, MD 01/28/15 [phone]

## 2015-01-28 NOTE — Discharge Instructions (Signed)
Genital Herpes °Genital herpes is a sexually transmitted disease. This means that it is a disease passed by having sex with an infected person. There is no cure for genital herpes. The time between attacks can be months to years. The virus may live in a person but produce no problems (symptoms). This infection can be passed to a baby as it travels down the birth canal (vagina). In a newborn, this can cause central nervous system damage, eye damage, or even death. The virus that causes genital herpes is usually HSV-2 virus. The virus that causes oral herpes is usually HSV-1. The diagnosis (learning what is wrong) is made through culture results. °SYMPTOMS  °Usually symptoms of pain and itching begin a few days to a week after contact. It first appears as small blisters that progress to small painful ulcers which then scab over and heal after several days. It affects the outer genitalia, birth canal, cervix, penis, anal area, buttocks, and thighs. °HOME CARE INSTRUCTIONS  °· Keep ulcerated areas dry and clean. °· Take medications as directed. Antiviral medications can speed up healing. They will not prevent recurrences or cure this infection. These medications can also be taken for suppression if there are frequent recurrences. °· While the infection is active, it is contagious. Avoid all sexual contact during active infections. °· Condoms may help prevent spread of the herpes virus. °· Practice safe sex. °· Wash your hands thoroughly after touching the genital area. °· Avoid touching your eyes after touching your genital area. °· Inform your caregiver if you have had genital herpes and become pregnant. It is your responsibility to insure a safe outcome for your baby in this pregnancy. °· Only take over-the-counter or prescription medicines for pain, discomfort, or fever as directed by your caregiver. °SEEK MEDICAL CARE IF:  °· You have a recurrence of this infection. °· You do not respond to medications and are not  improving. °· You have new sources of pain or discharge which have changed from the original infection. °· You have an oral temperature above 102° F (38.9° C). °· You develop abdominal pain. °· You develop eye pain or signs of eye infection. °Document Released: 06/04/2000 Document Revised: 08/30/2011 Document Reviewed: 06/25/2009 °[employer]® Patient Information ©2015 [employer]. This information is not intended to replace advice given to you by your health care provider. Make sure you discuss any questions you have with your health care provider. ° °

## 2015-01-28 NOTE — ED Notes (Signed)
Pt presents to ED with worsening vaginal pain and swelling. Pt states she was seen in this ED Saturday and was dx with a bacterial vaginal infection and was treated with two antibiotics. Pt reports she feels like her symptoms are worsening with increased vaginal pain. Denies discharge or fever.

## 2016-09-17 DIAGNOSIS — H40033 Anatomical narrow angle, bilateral: Secondary | ICD-10-CM | POA: Diagnosis not present

## 2016-09-17 DIAGNOSIS — H16223 Keratoconjunctivitis sicca, not specified as Sjogren's, bilateral: Secondary | ICD-10-CM | POA: Diagnosis not present

## 2016-11-17 ENCOUNTER — Other Ambulatory Visit (HOSPITAL_COMMUNITY)
Admission: RE | Admit: 2016-11-17 | Discharge: 2016-11-17 | Disposition: A | Discharge status: Home / Self Care | Payer: Medicaid Other | Source: Ambulatory Visit | Attending: Family Medicine | Admitting: Family Medicine

## 2016-11-17 ENCOUNTER — Encounter: Payer: Self-pay | Admitting: Family Medicine

## 2016-11-17 ENCOUNTER — Ambulatory Visit (INDEPENDENT_AMBULATORY_CARE_PROVIDER_SITE_OTHER): Payer: Medicaid Other | Admitting: Family Medicine

## 2016-11-17 VITALS — BP 136/81 | HR 97 | Resp 18 | Ht 67.0 in | Wt 279.0 lb

## 2016-11-17 DIAGNOSIS — Z01419 Encounter for gynecological examination (general) (routine) without abnormal findings: Secondary | ICD-10-CM | POA: Diagnosis not present

## 2016-11-17 DIAGNOSIS — Z0000 Encounter for general adult medical examination without abnormal findings: Secondary | ICD-10-CM

## 2016-11-17 DIAGNOSIS — Z3161 Procreative counseling and advice using natural family planning: Secondary | ICD-10-CM

## 2016-11-17 DIAGNOSIS — Z3169 Encounter for other general counseling and advice on procreation: Secondary | ICD-10-CM

## 2016-11-17 NOTE — Progress Notes (Signed)
   CLINIC ENCOUNTER NOTE  History:  36 y.o. Kelsey Hughes here today for yearly physical. She denies any abnormal vaginal discharge, bleeding, pelvic pain or other concerns.   Reports she will be marrying in the next year and might desire pregnancy with partner. She wants to know abut fertility.   History reviewed. No pertinent past medical history.  Past Surgical History:  Procedure Laterality Date  . ANTERIOR CRUCIATE LIGAMENT REPAIR    . DILATION AND CURETTAGE, DIAGNOSTIC / THERAPEUTIC      The following portions of the patient's history were reviewed and updated as appropriate: allergies, current medications, past family history, past medical history, past social history, past surgical history and problem list.   Health Maintenance:  Normal pap 09/18/2013.    Review of Systems:  Pertinent items noted in HPI and remainder of comprehensive ROS otherwise negative.   Objective:  Physical Exam BP 136/81 (BP Location: Left Arm, Patient Position: Sitting, Cuff Size: Large)   Pulse 97   Resp 18   Ht 5\' 7"  (1.702 m)   Wt 279 lb (126.6 kg)   LMP 10/29/2016   BMI 43.70 kg/m  CONSTITUTIONAL: Well-developed, well-nourished female in no acute distress.  HENT:  Normocephalic, atraumatic. External right and left ear normal. Oropharynx is clear and moist EYES: Conjunctivae and EOM are normal. Pupils are equal, round, and reactive to light. No scleral icterus.  NECK: Normal range of motion, supple, no masses SKIN: Skin is warm and dry. No rash noted. Not diaphoretic. No erythema. No pallor. [place]: Alert and oriented to person, place, and time. Normal reflexes, muscle tone coordination. No cranial nerve deficit noted. PSYCHIATRIC: Normal mood and affect. Normal behavior. Normal judgment and thought content. CARDIOVASCULAR: Normal heart rate noted RESPIRATORY: Effort and breath sounds normal, no problems with respiration noted BREAST: Symmetric breasts, no masses noted. No nipple retraction,  or discharge ABDOMEN: Soft, no distention noted.   PELVIC: Normal appearing external genitalia; normal appearing vaginal mucosa and cervix.  No abnormal discharge noted.  Normal uterine size, no other palpable masses, no uterine or adnexal tenderness. MUSCULOSKELETAL: Normal range of motion. No edema noted.  Labs and Imaging No results found.  Assessment & Plan:  1. Well woman exam with routine gynecological exam - Reviewed routine preventative measure based on age - Discussed STD screening, declined - Reviewed healthy weight/diet - Cytology - PAP  2. Encounter for preconception consultation - Reviewed reduction in fertility in late 75s - Recommended starting prenatal vitamin - Reviewed weight loss to increase fertility, recommended 10% reduction - Discussed tracking period and targeting sexual activity - Reviewed trying for 3 months and then having an appointment to discuss REI referral given age of herself and partner  - TSH - Hemoglobin A1c - Anti mullerian hormone   Routine preventative health maintenance measures emphasized. Please refer to After Visit Summary for other counseling recommendations.   Return in about 3 months (around 02/17/2017), or Fertility counseling.   Total face-to-face time with patient: 15 minutes. Over 50% of encounter was spent on counseling and coordination of care.

## 2016-11-22 LAB — CYTOLOGY - PAP
DIAGNOSIS: NEGATIVE
HPV (WINDOPATH): NOT DETECTED

## 2016-11-22 LAB — HEMOGLOBIN A1C
Est. average glucose Bld gHb Est-mCnc: 117 mg/dL
HEMOGLOBIN A1C: 5.7 % — AB (ref 4.8–5.6)

## 2016-11-22 LAB — TSH: TSH: 1.74 u[IU]/mL (ref 0.450–4.500)

## 2016-11-22 LAB — ANTI MULLERIAN HORMONE: ANTI-MULLERIAN HORMONE (AMH): 0.589 ng/mL

## 2017-09-06 ENCOUNTER — Encounter: Payer: Self-pay | Admitting: Radiology

## 2018-01-19 DIAGNOSIS — H1013 Acute atopic conjunctivitis, bilateral: Secondary | ICD-10-CM | POA: Diagnosis not present

## 2018-01-19 DIAGNOSIS — H5213 Myopia, bilateral: Secondary | ICD-10-CM | POA: Diagnosis not present

## 2018-08-28 ENCOUNTER — Emergency Department (HOSPITAL_COMMUNITY)
Admission: EM | Admit: 2018-08-28 | Discharge: 2018-08-28 | Disposition: A | Discharge status: Home / Self Care | Payer: Medicaid Other | Attending: Emergency Medicine | Admitting: Emergency Medicine

## 2018-08-28 ENCOUNTER — Emergency Department (HOSPITAL_COMMUNITY): Payer: Medicaid Other

## 2018-08-28 ENCOUNTER — Encounter (HOSPITAL_COMMUNITY): Payer: Self-pay | Admitting: Emergency Medicine

## 2018-08-28 ENCOUNTER — Other Ambulatory Visit: Payer: Self-pay

## 2018-08-28 DIAGNOSIS — J111 Influenza due to unidentified influenza virus with other respiratory manifestations: Secondary | ICD-10-CM | POA: Diagnosis not present

## 2018-08-28 DIAGNOSIS — Z79899 Other long term (current) drug therapy: Secondary | ICD-10-CM | POA: Diagnosis not present

## 2018-08-28 DIAGNOSIS — R000 Tachycardia, unspecified: Secondary | ICD-10-CM | POA: Diagnosis not present

## 2018-08-28 DIAGNOSIS — R0602 Shortness of breath: Secondary | ICD-10-CM | POA: Diagnosis not present

## 2018-08-28 DIAGNOSIS — R51 Headache: Secondary | ICD-10-CM | POA: Diagnosis not present

## 2018-08-28 DIAGNOSIS — J209 Acute bronchitis, unspecified: Secondary | ICD-10-CM | POA: Diagnosis not present

## 2018-08-28 DIAGNOSIS — R05 Cough: Secondary | ICD-10-CM | POA: Diagnosis present

## 2018-08-28 LAB — BASIC METABOLIC PANEL
ANION GAP: 9 (ref 5–15)
BUN: 7 mg/dL (ref 6–20)
CO2: 23 mmol/L (ref 22–32)
Calcium: 8.8 mg/dL — ABNORMAL LOW (ref 8.9–10.3)
Chloride: 105 mmol/L (ref 98–111)
Creatinine, Ser: 0.6 mg/dL (ref 0.44–1.00)
Glucose, Bld: 112 mg/dL — ABNORMAL HIGH (ref 70–99)
Potassium: 3.6 mmol/L (ref 3.5–5.1)
Sodium: 137 mmol/L (ref 135–145)

## 2018-08-28 LAB — CBC WITH DIFFERENTIAL/PLATELET
Abs Immature Granulocytes: 0.03 10*3/uL (ref 0.00–0.07)
BASOS ABS: 0 10*3/uL (ref 0.0–0.1)
Basophils Relative: 0 %
Eosinophils Absolute: 0.1 10*3/uL (ref 0.0–0.5)
Eosinophils Relative: 1 %
HCT: 41.4 % (ref 36.0–46.0)
Hemoglobin: 13.5 g/dL (ref 12.0–15.0)
IMMATURE GRANULOCYTES: 0 %
LYMPHS PCT: 8 %
Lymphs Abs: 0.7 10*3/uL (ref 0.7–4.0)
MCH: 30.8 pg (ref 26.0–34.0)
MCHC: 32.6 g/dL (ref 30.0–36.0)
MCV: 94.3 fL (ref 80.0–100.0)
Monocytes Absolute: 0.9 10*3/uL (ref 0.1–1.0)
Monocytes Relative: 11 %
NEUTROS PCT: 80 %
Neutro Abs: 6.4 10*3/uL (ref 1.7–7.7)
PLATELETS: 289 10*3/uL (ref 150–400)
RBC: 4.39 MIL/uL (ref 3.87–5.11)
RDW: 13.7 % (ref 11.5–15.5)
WBC: 8.2 10*3/uL (ref 4.0–10.5)
nRBC: 0 % (ref 0.0–0.2)

## 2018-08-28 LAB — POC URINE PREG, ED: Preg Test, Ur: NEGATIVE

## 2018-08-28 LAB — I-STAT TROPONIN, ED: Troponin i, poc: 0 ng/mL (ref 0.00–0.08)

## 2018-08-28 MED ORDER — ALBUTEROL SULFATE HFA 108 (90 BASE) MCG/ACT IN AERS: 2.0000 | INHALATION_SPRAY | RESPIRATORY_TRACT | 2 refills | Status: DC | PRN

## 2018-08-28 MED ORDER — KETOROLAC TROMETHAMINE 30 MG/ML IJ SOLN
30.0000 mg | Freq: Once | INTRAMUSCULAR | Status: AC
  Administered 2018-08-28: 30 mg via INTRAVENOUS
  Filled 2018-08-28: qty 1

## 2018-08-28 MED ORDER — ALBUTEROL SULFATE (2.5 MG/3ML) 0.083% IN NEBU
2.5000 mg | INHALATION_SOLUTION | Freq: Once | RESPIRATORY_TRACT | Status: AC
  Administered 2018-08-28: 2.5 mg via RESPIRATORY_TRACT
  Filled 2018-08-28: qty 3

## 2018-08-28 MED ORDER — OSELTAMIVIR PHOSPHATE 75 MG PO CAPS: 75.0000 mg | ORAL_CAPSULE | Freq: Two times a day (BID) | ORAL | 0 refills | Status: DC

## 2018-08-28 MED ORDER — BENZONATATE 100 MG PO CAPS: 100.0000 mg | ORAL_CAPSULE | Freq: Three times a day (TID) | ORAL | 0 refills | Status: DC | PRN

## 2018-08-28 MED ORDER — SODIUM CHLORIDE 0.9 % IV BOLUS
1000.0000 mL | Freq: Once | INTRAVENOUS | Status: AC
  Administered 2018-08-28: 1000 mL via INTRAVENOUS

## 2018-08-28 MED ORDER — METOCLOPRAMIDE HCL 5 MG/ML IJ SOLN
10.0000 mg | Freq: Once | INTRAMUSCULAR | Status: AC
  Administered 2018-08-28: 10 mg via INTRAVENOUS
  Filled 2018-08-28: qty 2

## 2018-08-28 MED ORDER — PREDNISONE 20 MG PO TABS: 40.0000 mg | ORAL_TABLET | Freq: Every day | ORAL | 0 refills | Status: DC

## 2018-08-28 MED ORDER — DIPHENHYDRAMINE HCL 50 MG/ML IJ SOLN
25.0000 mg | Freq: Once | INTRAMUSCULAR | Status: AC
  Administered 2018-08-28: 25 mg via INTRAVENOUS
  Filled 2018-08-28: qty 1

## 2018-08-28 NOTE — ED Provider Notes (Signed)
[facility] EMERGENCY DEPARTMENT Provider Note   CSN: [id number] Arrival date & time: 08/28/18  8502    History   Chief Complaint Chief Complaint  Patient presents with  . flu like symptoms    HPI Kelsey Hughes is a 38 y.o. female.     Patient presents with complaints of flulike symptoms that began yesterday.  She has had moderate to severe and constant pounding headache, chest tightness, cough that is nonproductive, fever, chills, body aches.     History reviewed. No pertinent past medical history.  Patient Active Problem List   Diagnosis Date Noted  . Anemia 11/03/2013  . Morbid obesity ([place]) 09/18/2013  . Impaired glucose metabolism 09/18/2013  . ANTERIOR CRUCIATE LIGAMENT TEAR, LEFT KNEE 06/22/1995    Past Surgical History:  Procedure Laterality Date  . ANTERIOR CRUCIATE LIGAMENT REPAIR    . DILATION AND CURETTAGE, DIAGNOSTIC / THERAPEUTIC       OB History    Gravida  5   Para  2   Term  2   Preterm      AB  3   Living  2     SAB  2   TAB  1   Ectopic      Multiple      Live Births  2            Home Medications    Prior to Admission medications   Medication Sig Start Date End Date Taking? Authorizing Provider  Multiple Vitamin (MULTIVITAMIN) tablet Take 1 tablet by mouth daily.    [provider]  omeprazole (PRILOSEC) 20 MG capsule Take 1 capsule (20 mg total) by mouth daily. PATIENT NEEDS OFFICE VISIT FOR ADDITIONAL REFILLS 06/12/14   Barton Fanny, MD  vitamin B-12 (CYANOCOBALAMIN) 1000 MCG tablet Take 1,000 mcg by mouth daily.    [provider]    Family History Family History  Problem Relation Age of Onset  . Hypertension Father   . Heart disease Father   . Hypertension Mother   . Atrial fibrillation Mother     Social History Social History   Tobacco Use  . Smoking status: Never Smoker  . Smokeless tobacco: Never Used  Substance Use Topics  . Alcohol use: No  . Drug  use: No     Allergies   Sulfonamide derivatives   Review of Systems Review of Systems  Constitutional: Positive for chills and fever.  Respiratory: Positive for cough.   Gastrointestinal: Positive for nausea.  Musculoskeletal: Positive for myalgias.  Neurological: Positive for headaches.  All other systems reviewed and are negative.    Physical Exam Updated Vital Signs BP (!) 160/92 (BP Location: Right Arm)   Pulse (!) 103   Temp 98.6 F (37 C) (Oral)   Resp 16   Ht 5\' 7"  (1.702 m)   Wt 113.4 kg   LMP 08/14/2018   SpO2 98%   BMI 39.16 kg/m   Physical Exam Vitals signs and nursing note reviewed.  Constitutional:      General: She is not in acute distress.    Appearance: Normal appearance. She is well-developed.  HENT:     Head: Normocephalic and atraumatic.     Right Ear: Hearing normal.     Left Ear: Hearing normal.     Nose: Nose normal.  Eyes:     Conjunctiva/sclera: Conjunctivae normal.     Pupils: Pupils are equal, round, and reactive to light.  Neck:  Musculoskeletal: Normal range of motion and neck supple.  Cardiovascular:     Rate and Rhythm: Regular rhythm.     Heart sounds: S1 normal and S2 normal. No murmur. No friction rub. No gallop.   Pulmonary:     Effort: Pulmonary effort is normal. No respiratory distress.     Breath sounds: Normal breath sounds.  Chest:     Chest wall: No tenderness.  Abdominal:     General: Bowel sounds are normal.     Palpations: Abdomen is soft.     Tenderness: There is no abdominal tenderness. There is no guarding or rebound. Negative signs include Murphy's sign and McBurney's sign.     Hernia: No hernia is present.  Musculoskeletal: Normal range of motion.  Skin:    General: Skin is warm and dry.     Findings: No rash.  Neurological:     Mental Status: She is alert and oriented to person, place, and time.     GCS: GCS eye subscore is 4. GCS verbal subscore is 5. GCS motor subscore is 6.     Cranial Nerves:  No cranial nerve deficit.     Sensory: No sensory deficit.     Coordination: Coordination normal.  Psychiatric:        Speech: Speech normal.        Behavior: Behavior normal.        Thought Content: Thought content normal.      ED Treatments / Results  Labs (all labs ordered are listed, but only abnormal results are displayed) Labs Reviewed  CBC WITH DIFFERENTIAL/PLATELET  BASIC METABOLIC PANEL  I-STAT TROPONIN, ED    EKG EKG Interpretation  Date/Time:  Monday August 28 2018 05:27:47 EDT Ventricular Rate:  103 PR Interval:    QRS Duration: 92 QT Interval:  325 QTC Calculation: 426 R Axis:   27 Text Interpretation:  Sinus tachycardia Otherwise within normal limits Confirmed by Orpah Greek [id number]) on 08/28/2018 5:34:19 AM   Radiology Dg Chest 2 View  Result Date: 08/28/2018 CLINICAL DATA:  Shortness of breath EXAM: CHEST - 2 VIEW COMPARISON:  06/01/2010 FINDINGS: Small density at the left cardiac apex is likely fat pad, similar to prior. No consolidation based on both views. No edema, effusion, or pneumothorax. Normal heart size. Artifact from EKG leads. IMPRESSION: No evidence of acute disease. Electronically Signed   By: Monte Fantasia M.D.   On: 08/28/2018 06:02    Procedures Procedures (including critical care time)  Medications Ordered in ED Medications  albuterol (PROVENTIL) (2.5 MG/3ML) 0.083% nebulizer solution 2.5 mg (has no administration in time range)  sodium chloride 0.9 % bolus 1,000 mL (1,000 mLs Intravenous New Bag/Given 08/28/18 0610)  ketorolac (TORADOL) 30 MG/ML injection 30 mg (30 mg Intravenous Given 08/28/18 0618)  metoCLOPramide (REGLAN) injection 10 mg (10 mg Intravenous Given 08/28/18 0620)  diphenhydrAMINE (BENADRYL) injection 25 mg (25 mg Intravenous Given 08/28/18 0623)     Initial Impression / Assessment and Plan / ED Course  I have reviewed the triage vital signs and the nursing notes.  Pertinent labs & imaging results that were  available during my care of the patient were reviewed by me and considered in my medical decision making (see chart for details).        Patient presents to the emergency department for evaluation of multiple symptoms consistent with viral illness.  Patient complaining of headache, fever, chills, generalized body aches.  She has had a cough that is nonproductive and now  is experiencing tightness in her chest.  EKG reveals very slight tachycardia, otherwise normal.  Troponin negative.  Blood work unremarkable.  Chest x-ray does not show any evidence of pneumonia.  Patient treated with Toradol, Reglan, Benadryl and IV fluids for her headache.  We will continue empiric flu treatment.  Patient appears well, appropriate for outpatient treatment.  Final Clinical Impressions(s) / ED Diagnoses   Final diagnoses:  Influenza    ED Discharge Orders    None       Orpah Greek, MD 08/28/18 [phone]

## 2018-08-28 NOTE — ED Triage Notes (Signed)
Pt reports flu like symptoms including nonproductive cough, chest tightness, fever, generalized body aches and head pressure X2 days

## 2018-08-28 NOTE — ED Notes (Signed)
Patient transported to X-ray 

## 2018-11-29 ENCOUNTER — Other Ambulatory Visit: Payer: Self-pay

## 2018-11-29 ENCOUNTER — Encounter: Payer: Self-pay | Admitting: Family Medicine

## 2018-11-29 ENCOUNTER — Ambulatory Visit (INDEPENDENT_AMBULATORY_CARE_PROVIDER_SITE_OTHER): Payer: Medicaid Other | Admitting: Family Medicine

## 2018-11-29 VITALS — BP 138/89 | HR 68 | Ht 66.0 in | Wt 257.0 lb

## 2018-11-29 DIAGNOSIS — N979 Female infertility, unspecified: Secondary | ICD-10-CM | POA: Diagnosis not present

## 2018-11-29 DIAGNOSIS — R7309 Other abnormal glucose: Secondary | ICD-10-CM | POA: Diagnosis not present

## 2018-11-29 DIAGNOSIS — B372 Candidiasis of skin and nail: Secondary | ICD-10-CM

## 2018-11-29 MED ORDER — LETROZOLE 2.5 MG PO TABS: 2.5000 mg | ORAL_TABLET | Freq: Every day | ORAL | 2 refills | Status: DC

## 2018-11-29 MED ORDER — NYSTATIN 100000 UNIT/GM EX POWD: Freq: Four times a day (QID) | CUTANEOUS | 0 refills | Status: DC

## 2018-11-29 NOTE — Progress Notes (Signed)
   GYNECOLOGY PROBLEM  VISIT ENCOUNTER NOTE  Subjective:   Kelsey Hughes is a 38 y.o. [phone] female here for a a problem GYN visit.  Current complaints:   Rash  This is a new problem. The current episode started in the past 7 days. The problem has been gradually worsening since onset. The affected locations include the groin. The rash is characterized by itchiness. She was exposed to nothing. Pertinent negatives include no anorexia, congestion, cough, diarrhea, eye pain, facial edema, fatigue, fever, joint pain, nail changes, rhinorrhea, shortness of breath, sore throat or vomiting. Past treatments include nothing. There is no history of eczema.    Infertility: has ben having strategically timed sex for 5 months. Having regular cycles and is tracking her periods. They are every month and last for 5 days. We tested AMH in 2018 and it was low (0.5). She has lost about 20lbs  Wt Readings from Last 3 Encounters:  11/29/18 257 lb (116.6 kg)  08/28/18 250 lb (113.4 kg)  11/17/16 279 lb (126.6 kg)    Denies abnormal vaginal bleeding, discharge, pelvic pain, problems with intercourse or other gynecologic concerns.    Gynecologic History Patient's last menstrual period was 11/28/2018 (exact date). Contraception: none  Health Maintenance Due  Topic Date Due  . HIV Screening  06/29/1993  . TETANUS/TDAP  06/29/1997     The following portions of the patient's history were reviewed and updated as appropriate: allergies, current medications, past family history, past medical history, past social history, past surgical history and problem list.  Review of Systems Pertinent items are noted in HPI.   Objective:  BP 138/89   Pulse 68   Ht 5\' 6"  (1.676 m)   Wt 257 lb (116.6 kg)   LMP 11/28/2018 (Exact Date)   BMI 41.48 kg/m  Gen: well appearing, NAD HEENT: no scleral icterus CV: RR Lung: Normal WOB Ext: warm well perfused  Groin: Erythematous, excoriated rash.    Assessment and Plan:    1. Impaired glucose metabolism - TSH - Hemoglobin A1c  2. Infertility, female  Reviewed continued weight reduction  Recommended referral to [facility] but patient does not have access to this service based on insurance.   Recommended partner establish PCP care and have screening for DM and a sperm count.   Reviewed continued use of prenatal vitamin  We discussed that her age, weight, insulin resistance and borderline BP she will be a high risk pregnancy  - letrozole (FEMARA) 2.5 MG tablet; Take 1 tablet (2.5 mg total) by mouth daily. Take on days 3 to 7 following a spontaneous menses or progestin-induced bleed.  Dispense: 5 tablet; Refill: 2  3. Yeast dermatitis - nystatin (MYCOSTATIN/NYSTOP) powder; Apply topically 4 (four) times daily.  Dispense: 15 g; Refill: 0  Please refer to After Visit Summary for other counseling recommendations.   Return in about 3 months (around 03/01/2019) for fertility.  Caren Macadam, MD, MPH, ABFM Attending [place] for [facility]

## 2018-11-29 NOTE — Patient Instructions (Addendum)
[facility] [place]    Fertility Group:  [facility]  Dr Marlowe Alt  Diabetes Mellitus and Nutrition, Adult When you have diabetes (diabetes mellitus), it is very important to have healthy eating habits because your blood sugar (glucose) levels are greatly affected by what you eat and drink. Eating healthy foods in the appropriate amounts, at about the same times every day, can help you:  Control your blood glucose.  Lower your risk of heart disease.  Improve your blood pressure.  Reach or maintain a healthy weight. Every person with diabetes is different, and each person has different needs for a meal plan. Your health care provider may recommend that you work with a diet and nutrition specialist (dietitian) to make a meal plan that is best for you. Your meal plan may vary depending on factors such as:  The calories you need.  The medicines you take.  Your weight.  Your blood glucose, blood pressure, and cholesterol levels.  Your activity level.  Other health conditions you have, such as heart or kidney disease. How do carbohydrates affect me? Carbohydrates, also called carbs, affect your blood glucose level more than any other type of food. Eating carbs naturally raises the amount of glucose in your blood. Carb counting is a method for keeping track of how many carbs you eat. Counting carbs is important to keep your blood glucose at a healthy level, especially if you use insulin or take certain oral diabetes medicines. It is important to know how many carbs you can safely have in each meal. This is different for every person. Your dietitian can help you calculate how many carbs you should have at each meal and for each snack. Foods that contain carbs include:  Bread, cereal, rice, pasta, and crackers.  Potatoes and corn.  Peas, beans, and lentils.   Milk and yogurt.  Fruit and juice.  Desserts, such as cakes, cookies, ice cream, and candy. How does alcohol affect me? Alcohol can cause a sudden decrease in blood glucose (hypoglycemia), especially if you use insulin or take certain oral diabetes medicines. Hypoglycemia can be a life-threatening condition. Symptoms of hypoglycemia (sleepiness, dizziness, and confusion) are similar to symptoms of having too much alcohol. If your health care provider says that alcohol is safe for you, follow these guidelines:  Limit alcohol intake to no more than 1 drink per day for nonpregnant women and 2 drinks per day for men. One drink equals 12 oz of beer, 5 oz of wine, or 1 oz of hard liquor.  Do not drink on an empty stomach.  Keep yourself hydrated with water, diet soda, or unsweetened iced tea.  Keep in mind that regular soda, juice, and other mixers may contain a lot of sugar and must be counted as carbs. What are tips for following this plan?  Reading food labels  Start by checking the serving size on the "Nutrition Facts" label of packaged foods and drinks. The amount of calories, carbs, fats, and other nutrients listed on the label is based on one serving of the item. Many items contain more than one serving per package.  Check the total grams (g) of carbs in one serving. You can calculate the number of servings of carbs in one serving by dividing the total carbs by 15. For example, if a food has 30 g of total carbs, it would be equal to 2 servings of carbs.  Check the number of grams (g) of saturated and trans fats in one serving. Choose foods that have low or no amount of these fats.  Check the number of milligrams (mg) of salt (sodium) in one serving. Most people should limit total sodium intake to less than 2,300 mg per day.  Always check the nutrition information of foods labeled as "low-fat" or "nonfat". These foods may be higher in added sugar or refined carbs and should be avoided.   Talk to your dietitian to identify your daily goals for nutrients listed on the label. Shopping  Avoid buying canned, premade, or processed foods. These foods tend to be high in fat, sodium, and added sugar.  Shop around the outside edge of the grocery store. This includes fresh fruits and vegetables, bulk grains, fresh meats, and fresh dairy. Cooking  Use low-heat cooking methods, such as baking, instead of high-heat cooking methods like deep frying.  Cook using healthy oils, such as olive, canola, or sunflower oil.  Avoid cooking with butter, cream, or high-fat meats. Meal planning  Eat meals and snacks regularly, preferably at the same times every day. Avoid going long periods of time without eating.  Eat foods high in fiber, such as fresh fruits, vegetables, beans, and whole grains. Talk to your dietitian about how many servings of carbs you can eat at each meal.  Eat 4-6 ounces (oz) of lean protein each day, such as lean meat, chicken, fish, eggs, or tofu. One oz of lean protein is equal to: ? 1 oz of meat, chicken, or fish. ? 1 egg. ?  cup of tofu.  Eat some foods each day that contain healthy fats, such as avocado, nuts, seeds, and fish. Lifestyle  Check your blood glucose regularly.  Exercise regularly as told by your health care provider. This may include: ? 150 minutes of moderate-intensity or vigorous-intensity exercise each week. This could be brisk walking, biking, or water aerobics. ? Stretching and doing strength exercises, such as yoga or weightlifting, at least 2 times a week.  Take medicines as told by your health care provider.  Do not use any products that contain nicotine or tobacco, such as cigarettes and e-cigarettes. If you need help quitting, ask your health care provider.  Work with a Social worker or diabetes educator to identify strategies to manage stress and any emotional and social challenges. Questions to ask a health care provider  Do I need to  meet with a diabetes educator?  Do I need to meet with a dietitian?  What number can I call if I have questions?  When are the best times to check my blood glucose? Where to find more information:  American Diabetes Association: diabetes.org  Academy of Nutrition and Dietetics: www.eatright.[employer] of Diabetes and Digestive and Kidney Diseases (NIH): [employer] Summary  A healthy meal plan will help you control your blood glucose and maintain a healthy lifestyle.  Working with a diet and nutrition specialist (dietitian) can help you make a meal plan that is best for you.  Keep in mind that carbohydrates (carbs) and alcohol have immediate effects on your blood glucose levels. It is important to count carbs and to use alcohol carefully. This information is not intended to replace advice given to you by your health care provider. Make sure you discuss any questions you have with your health care provider. Document Released: 03/04/2005 Document Revised: 01/05/2017 Document Reviewed: 07/12/2016 Elsevier Interactive Patient Education  2019 [employer]. Diet for Polycystic Ovary Syndrome  Polycystic ovary syndrome (PCOS) is a disorder of the chemicals (hormones) that regulate a woman's reproductive system, including monthly periods (menstruation). The condition causes important hormones to be out of balance. PCOS can:  Stop your periods or make them irregular.  Cause cysts to develop on your ovaries.  Make it difficult to get pregnant.  Stop your body from responding to the effects of insulin (insulin resistance). Insulin resistance can lead to obesity and diabetes. Changing what you eat can help you manage PCOS and improve your health. Following a balanced diet can help you lose weight and improve the way that your body uses insulin. What are tips for following this plan?  Follow a balanced diet for meals and snacks. Eat breakfast, lunch, dinner, and one or two  snacks every day.  Include protein in each meal and snack.  Choose whole grains instead of products that are made with refined flour.  Eat a variety of foods.  Exercise regularly as told by your health care provider. Aim to do 30 or more minutes of exercise on most days of the week.  If you are overweight or obese: ? Pay attention to how many calories you eat. Cutting down on calories can help you lose weight. ? Work with your health care provider or a diet and nutrition specialist (dietitian) to figure out how many calories you need each day. What foods can I eat?  Fruits Include a variety of colors and types. All fruits are helpful for PCOS. Vegetables Include a variety of colors and types. All vegetables are helpful for PCOS. Grains Whole grains, such as whole wheat. Whole-grain breads, crackers, cereals, and pasta. Unsweetened oatmeal, bulgur, barley, quinoa, and brown rice. Tortillas made from corn or whole-wheat flour. Meats and other proteins Low-fat (lean) proteins, such as fish, chicken, beans, eggs, and tofu. Dairy Low-fat dairy products, such as skim milk, cheese sticks, and yogurt. Beverages Low-fat or fat-free drinks, such as water, low-fat milk, sugar-free drinks, and small amounts of 100% fruit juice. Seasonings and condiments Ketchup. Mustard. Barbecue sauce. Relish. Low-fat or fat-free mayonnaise. Fats and oils Olive oil or canola oil. Walnuts and almonds. The items listed above may not be a complete list of recommended foods and beverages. Contact a dietitian for more options. What foods are not recommended? Foods that are high in calories or fat. Fried foods. Sweets. Products that are made from refined white flour, including white bread, pastries, white rice, and pasta. The items listed above may not be a complete list of foods and beverages to avoid. Contact a dietitian for more information. Summary  PCOS is a hormonal imbalance that affects a woman's  reproductive system.  You can help to manage your PCOS by exercising regularly and eating a healthy, varied diet of vegetables, fruit, whole grains, low-fat (lean) protein, and low-fat dairy products.  Changing what you eat can improve the way that your body uses insulin, help your hormones reach normal levels, and help you lose weight. This information is not intended to replace advice given to you by your health care provider. Make sure you discuss any questions you have with your health care provider. Document Released: 09/29/2015 Document Revised: 04/11/2017 Document Reviewed: 04/11/2017 Elsevier Interactive Patient Education  2019 [place] for [employer] Calories are units of energy. Your body needs a certain amount of calories from food to keep you going throughout the day. When you eat more calories than your body needs, your body stores  the extra calories as fat. When you eat fewer calories than your body needs, your body burns fat to get the energy it needs. Calorie counting means keeping track of how many calories you eat and drink each day. Calorie counting can be helpful if you need to lose weight. If you make sure to eat fewer calories than your body needs, you should lose weight. Ask your health care provider what a healthy weight is for you. For calorie counting to work, you will need to eat the right number of calories in a day in order to lose a healthy amount of weight per week. A dietitian can help you determine how many calories you need in a day and will give you suggestions on how to reach your calorie goal.  A healthy amount of weight to lose per week is usually 1-2 lb (0.5-0.9 kg). This usually means that your daily calorie intake should be reduced by 500-750 calories.  Eating 1,200 - 1,500 calories per day can help most women lose weight.  Eating 1,500 - 1,800 calories per day can help most men lose weight. What is my plan? My goal is to  have __________ calories per day. If I have this many calories per day, I should lose around __________ pounds per week. What do I need to know about calorie counting? In order to meet your daily calorie goal, you will need to:  Find out how many calories are in each food you would like to eat. Try to do this before you eat.  Decide how much of the food you plan to eat.  Write down what you ate and how many calories it had. Doing this is called keeping a food log. To successfully lose weight, it is important to balance calorie counting with a healthy lifestyle that includes regular activity. Aim for 150 minutes of moderate exercise (such as walking) or 75 minutes of vigorous exercise (such as running) each week. Where do I find calorie information?  The number of calories in a food can be found on a Nutrition Facts label. If a food does not have a Nutrition Facts label, try to look up the calories online or ask your dietitian for help. Remember that calories are listed per serving. If you choose to have more than one serving of a food, you will have to multiply the calories per serving by the amount of servings you plan to eat. For example, the label on a package of bread might say that a serving size is 1 slice and that there are 90 calories in a serving. If you eat 1 slice, you will have eaten 90 calories. If you eat 2 slices, you will have eaten 180 calories. How do I keep a food log? Immediately after each meal, record the following information in your food log:  What you ate. Don't forget to include toppings, sauces, and other extras on the food.  How much you ate. This can be measured in cups, ounces, or number of items.  How many calories each food and drink had.  The total number of calories in the meal. Keep your food log near you, such as in a small notebook in your pocket, or use a mobile app or website. Some programs will calculate calories for you and show you how many calories  you have left for the day to meet your goal. What are some calorie counting tips?   Use your calories on foods and drinks that will fill you  up and not leave you hungry: ? Some examples of foods that fill you up are nuts and nut butters, vegetables, lean proteins, and high-fiber foods like whole grains. High-fiber foods are foods with more than 5 g fiber per serving. ? Drinks such as sodas, specialty coffee drinks, alcohol, and juices have a lot of calories, yet do not fill you up.  Eat nutritious foods and avoid empty calories. Empty calories are calories you get from foods or beverages that do not have many vitamins or protein, such as candy, sweets, and soda. It is better to have a nutritious high-calorie food (such as an avocado) than a food with few nutrients (such as a bag of chips).  Know how many calories are in the foods you eat most often. This will help you calculate calorie counts faster.  Pay attention to calories in drinks. Low-calorie drinks include water and unsweetened drinks.  Pay attention to nutrition labels for "low fat" or "fat free" foods. These foods sometimes have the same amount of calories or more calories than the full fat versions. They also often have added sugar, starch, or salt, to make up for flavor that was removed with the fat.  Find a way of tracking calories that works for you. Get creative. Try different apps or programs if writing down calories does not work for you. What are some portion control tips?  Know how many calories are in a serving. This will help you know how many servings of a certain food you can have.  Use a measuring cup to measure serving sizes. You could also try weighing out portions on a kitchen scale. With time, you will be able to estimate serving sizes for some foods.  Take some time to put servings of different foods on your favorite plates, bowls, and cups so you know what a serving looks like.  Try not to eat straight from a  bag or box. Doing this can lead to overeating. Put the amount you would like to eat in a cup or on a plate to make sure you are eating the right portion.  Use smaller plates, glasses, and bowls to prevent overeating.  Try not to multitask (for example, watch TV or use your computer) while eating. If it is time to eat, sit down at a table and enjoy your food. This will help you to know when you are full. It will also help you to be aware of what you are eating and how much you are eating. What are tips for following this plan? Reading food labels  Check the calorie count compared to the serving size. The serving size may be smaller than what you are used to eating.  Check the source of the calories. Make sure the food you are eating is high in vitamins and protein and low in saturated and trans fats. Shopping  Read nutrition labels while you shop. This will help you make healthy decisions before you decide to purchase your food.  Make a grocery list and stick to it. Cooking  Try to cook your favorite foods in a healthier way. For example, try baking instead of frying.  Use low-fat dairy products. Meal planning  Use more fruits and vegetables. Half of your plate should be fruits and vegetables.  Include lean proteins like poultry and fish. How do I count calories when eating out?  Ask for smaller portion sizes.  Consider sharing an entree and sides instead of getting your own entree.  If  you get your own entree, eat only half. Ask for a box at the beginning of your meal and put the rest of your entree in it so you are not tempted to eat it.  If calories are listed on the menu, choose the lower calorie options.  Choose dishes that include vegetables, fruits, whole grains, low-fat dairy products, and lean protein.  Choose items that are boiled, broiled, grilled, or steamed. Stay away from items that are buttered, battered, fried, or served with cream sauce. Items labeled "crispy"  are usually fried, unless stated otherwise.  Choose water, low-fat milk, unsweetened iced tea, or other drinks without added sugar. If you want an alcoholic beverage, choose a lower calorie option such as a glass of wine or light beer.  Ask for dressings, sauces, and syrups on the side. These are usually high in calories, so you should limit the amount you eat.  If you want a salad, choose a garden salad and ask for grilled meats. Avoid extra toppings like bacon, cheese, or fried items. Ask for the dressing on the side, or ask for olive oil and vinegar or lemon to use as dressing.  Estimate how many servings of a food you are given. For example, a serving of cooked rice is  cup or about the size of half a baseball. Knowing serving sizes will help you be aware of how much food you are eating at restaurants. The list below tells you how big or small some common portion sizes are based on everyday objects: ? 1 oz-4 stacked dice. ? 3 oz-1 deck of cards. ? 1 tsp-1 die. ? 1 Tbsp- a ping-pong ball. ? 2 Tbsp-1 ping-pong ball. ?  cup- baseball. ? 1 cup-1 baseball. Summary  Calorie counting means keeping track of how many calories you eat and drink each day. If you eat fewer calories than your body needs, you should lose weight.  A healthy amount of weight to lose per week is usually 1-2 lb (0.5-0.9 kg). This usually means reducing your daily calorie intake by 500-750 calories.  The number of calories in a food can be found on a Nutrition Facts label. If a food does not have a Nutrition Facts label, try to look up the calories online or ask your dietitian for help.  Use your calories on foods and drinks that will fill you up, and not on foods and drinks that will leave you hungry.  Use smaller plates, glasses, and bowls to prevent overeating. This information is not intended to replace advice given to you by your health care provider. Make sure you discuss any questions you have with your health  care provider. Document Released: 06/07/2005 Document Revised: 02/24/2018 Document Reviewed: 05/07/2016 Elsevier Interactive Patient Education  2019 [employer].

## 2018-11-29 NOTE — Progress Notes (Signed)
Rash on inner thighs, and would also like to discuss fertility meds

## 2018-11-30 LAB — TSH: TSH: 1.47 u[IU]/mL (ref 0.450–4.500)

## 2018-11-30 LAB — HEMOGLOBIN A1C
Est. average glucose Bld gHb Est-mCnc: 117 mg/dL
Hgb A1c MFr Bld: 5.7 % — ABNORMAL HIGH (ref 4.8–5.6)

## 2018-12-13 ENCOUNTER — Other Ambulatory Visit: Payer: Self-pay

## 2018-12-13 MED ORDER — FLUCONAZOLE 150 MG PO TABS: 150.0000 mg | ORAL_TABLET | Freq: Once | ORAL | 0 refills | Status: AC

## 2018-12-13 NOTE — Telephone Encounter (Signed)
Patient rash has not gotten any better and patient reports Dr.Newton stated to call her back if it did not clear up. Advised patient to clean and keep area dry as best as possible. Diflucan called in to [facility] [place].

## 2019-01-11 ENCOUNTER — Emergency Department
Admission: EM | Admit: 2019-01-11 | Discharge: 2019-01-11 | Disposition: A | Discharge status: Home / Self Care | Payer: Medicaid Other | Attending: Emergency Medicine | Admitting: Emergency Medicine

## 2019-01-11 ENCOUNTER — Encounter: Payer: Self-pay | Admitting: Emergency Medicine

## 2019-01-11 ENCOUNTER — Emergency Department: Payer: Medicaid Other

## 2019-01-11 ENCOUNTER — Other Ambulatory Visit: Payer: Self-pay

## 2019-01-11 DIAGNOSIS — M79662 Pain in left lower leg: Secondary | ICD-10-CM | POA: Diagnosis not present

## 2019-01-11 DIAGNOSIS — Z79899 Other long term (current) drug therapy: Secondary | ICD-10-CM | POA: Insufficient documentation

## 2019-01-11 DIAGNOSIS — M7662 Achilles tendinitis, left leg: Secondary | ICD-10-CM | POA: Insufficient documentation

## 2019-01-11 NOTE — Discharge Instructions (Addendum)
Follow discharge care instructions and use over-the-counter anti-inflammatory medication for 3 to 5 days.  If no improvement follow-up PCP.  Your test was negative for DVTs today.

## 2019-01-11 NOTE — ED Notes (Signed)
See triage note .  Presents with pain to left ankle/calf  Denies any injury

## 2019-01-11 NOTE — ED Triage Notes (Signed)
Pt presents to ED via wheelchair from [facility] with for r/o DVT at this time. Pt is alert and oriented. Pt c/o L ankle pain that radiates up to L calf.

## 2019-01-11 NOTE — ED Provider Notes (Signed)
[facility] Emergency Department Provider Note   ____________________________________________   First MD Initiated Contact with Patient 01/11/19 1440     (approximate)  I have reviewed the triage vital signs and the nursing notes.   HISTORY  Chief Complaint Leg Pain    HPI Kelsey Hughes is a 38 y.o. female patient complaint left ankle calf pain for several days.  Patient the pain wax and wane.  Patient denies chest pain or dyspnea.  Patient is concerned for DVT secondary to strong family history.  Patient rates the pain is 8/10.  Patient described pain is "aching".  Patient the pain increased with dorsal and plantar flexion.  Patient was seen at another clinic today for this complaint To the emergency room to rule out DVT.      Past Medical History:  Diagnosis Date  . Infertility, female     Patient Active Problem List   Diagnosis Date Noted  . Anemia 11/03/2013  . Morbid obesity ([place]) 09/18/2013  . Impaired glucose metabolism 09/18/2013  . ANTERIOR CRUCIATE LIGAMENT TEAR, LEFT KNEE 06/22/1995    Past Surgical History:  Procedure Laterality Date  . ANTERIOR CRUCIATE LIGAMENT REPAIR    . DILATION AND CURETTAGE, DIAGNOSTIC / THERAPEUTIC      Prior to Admission medications   Medication Sig Start Date End Date Taking? Authorizing Provider  folic acid (FOLVITE) 1 MG tablet Take 1 mg by mouth daily.    [provider]  letrozole (FEMARA) 2.5 MG tablet Take 1 tablet (2.5 mg total) by mouth daily. Take on days 3 to 7 following a spontaneous menses or progestin-induced bleed. 11/29/18   Caren Macadam, MD  Multiple Vitamin (MULTIVITAMIN) tablet Take 1 tablet by mouth daily.    [provider]  nystatin (MYCOSTATIN/NYSTOP) powder Apply topically 4 (four) times daily. 11/29/18   Caren Macadam, MD  omeprazole (PRILOSEC) 20 MG capsule Take 1 capsule (20 mg total) by mouth daily. PATIENT NEEDS OFFICE VISIT FOR ADDITIONAL  REFILLS 06/12/14   Barton Fanny, MD  vitamin B-12 (CYANOCOBALAMIN) 1000 MCG tablet Take 1,000 mcg by mouth daily.    [provider]    Allergies Sulfonamide derivatives  Family History  Problem Relation Age of Onset  . Hypertension Father   . Heart disease Father   . Hypertension Mother   . Atrial fibrillation Mother     Social History Social History   Tobacco Use  . Smoking status: Never Smoker  . Smokeless tobacco: Never Used  Substance Use Topics  . Alcohol use: No  . Drug use: No    Review of Systems Constitutional: No fever/chills Eyes: No visual changes. ENT: No sore throat. Cardiovascular: Denies chest pain. Respiratory: Denies shortness of breath. Gastrointestinal: No abdominal pain.  No nausea, no vomiting.  No diarrhea.  No constipation. Genitourinary: Negative for dysuria. Musculoskeletal: Left posterior ankle and calf pain. Skin: Negative for rash. Neurological: Negative for headaches, focal weakness or numbness. Allergic/Immunilogical: Sulfacetamide ____________________________________________   PHYSICAL EXAM:  VITAL SIGNS: ED Triage Vitals  Enc Vitals Group     BP 01/11/19 1156 (!) 143/88     Pulse Rate 01/11/19 1156 (!) 109     Resp 01/11/19 1156 18     Temp 01/11/19 1156 98.8 F (37.1 C)     Temp Source 01/11/19 1156 Oral     SpO2 01/11/19 1156 97 %     Weight 01/11/19 1142 238 lb (108 kg)     Height 01/11/19 1142  5\' 6"  (1.676 m)     Head Circumference --      Peak Flow --      Pain Score 01/11/19 1142 8     Pain Loc --      Pain Edu? --      Excl. in [place]? --     Constitutional: Alert and oriented. Well appearing and in no acute distress.  Obesity. Cardiovascular: Normal rate, regular rhythm. Grossly normal heart sounds.  Good peripheral circulation. Respiratory: Normal respiratory effort.  No retractions. Lungs CTAB. Musculoskeletal: No obvious deformity to the left lower extremity.  Patient has moderate guarding  palpation of the origin and insertion points of the Achilles tendon left lower extremity.   Neurologic:  Normal speech and language. No gross focal neurologic deficits are appreciated. No gait instability. Skin:  Skin is warm, dry and intact. No rash noted. Psychiatric: Mood and affect are normal. Speech and behavior are normal.  ____________________________________________   LABS (all labs ordered are listed, but only abnormal results are displayed)  Labs Reviewed - No data to display ____________________________________________  EKG   ____________________________________________  RADIOLOGY  ED MD interpretation:   Official radiology report(s): US Venous Img Lower Unilateral Left  Result Date: 01/11/2019 CLINICAL DATA:  38 year old female with left lower extremity calf and ankle pain for the past 3 days EXAM: LEFT LOWER EXTREMITY VENOUS DOPPLER ULTRASOUND TECHNIQUE: Gray-scale sonography with graded compression, as well as color Doppler and duplex ultrasound were performed to evaluate the lower extremity deep venous systems from the level of the common femoral vein and including the common femoral, femoral, profunda femoral, popliteal and calf veins including the posterior tibial, peroneal and gastrocnemius veins when visible. The superficial great saphenous vein was also interrogated. Spectral Doppler was utilized to evaluate flow at rest and with distal augmentation maneuvers in the common femoral, femoral and popliteal veins. COMPARISON:  None. FINDINGS: Contralateral Common Femoral Vein: Respiratory phasicity is normal and symmetric with the symptomatic side. No evidence of thrombus. Normal compressibility. Common Femoral Vein: No evidence of thrombus. Normal compressibility, respiratory phasicity and response to augmentation. Saphenofemoral Junction: No evidence of thrombus. Normal compressibility and flow on color Doppler imaging. Profunda Femoral Vein: No evidence of thrombus. Normal  compressibility and flow on color Doppler imaging. Femoral Vein: No evidence of thrombus. Normal compressibility, respiratory phasicity and response to augmentation. Popliteal Vein: No evidence of thrombus. Normal compressibility, respiratory phasicity and response to augmentation. Calf Veins: No evidence of thrombus. Normal compressibility and flow on color Doppler imaging. Superficial Great Saphenous Vein: No evidence of thrombus. Normal compressibility. Venous Reflux:  None. Other Findings:  None. IMPRESSION: No evidence of deep venous thrombosis. Electronically Signed   By: Jacqulynn Cadet M.D.   On: 01/11/2019 14:18    ____________________________________________   PROCEDURES  Procedure(s) performed (including Critical Care):  Procedures   ____________________________________________   INITIAL IMPRESSION / ASSESSMENT AND PLAN / ED COURSE  As part of my medical decision making, I reviewed the following data within the [place] was evaluated in Emergency Department on 01/11/2019 for the symptoms described in the history of present illness. She was evaluated in the context of the global COVID-19 pandemic, which necessitated consideration that the patient might be at risk for infection with the SARS-CoV-2 virus that causes COVID-19. Institutional protocols and algorithms that pertain to the evaluation of patients at risk for COVID-19 are in a state of rapid change based  on information released by regulatory bodies including the CDC and federal and state organizations. These policies and algorithms were followed during the patient's care in the ED.   Patient presents with posterior heel and calf pain of the left lower extremity.  Patient was sent over [facility] clinic with concern for DVT secondary to strong family history.  Discussed negative DVT findings per ultrasound with patient.  Patient physical exam is consistent for Achilles tendinitis.   Patient given discharge care instruction and advised over-the-counter anti-inflammatory medication for 3 to 5 days.  If no improvement follow-up with PCP.      ____________________________________________   FINAL CLINICAL IMPRESSION(S) / ED DIAGNOSES  Final diagnoses:  Achilles tendinitis, left leg     ED Discharge Orders    None       Note:  This document was prepared using Dragon voice recognition software and may include unintentional dictation errors.    Sable Feil, PA-C 01/11/19 1506    Carrie Mew, MD 01/20/19 [phone]

## 2019-02-09 ENCOUNTER — Encounter: Payer: Self-pay | Admitting: Family Medicine

## 2019-02-09 ENCOUNTER — Ambulatory Visit (INDEPENDENT_AMBULATORY_CARE_PROVIDER_SITE_OTHER): Payer: Medicaid Other | Admitting: Family Medicine

## 2019-02-09 ENCOUNTER — Other Ambulatory Visit: Payer: Self-pay

## 2019-02-09 DIAGNOSIS — E559 Vitamin D deficiency, unspecified: Secondary | ICD-10-CM | POA: Diagnosis not present

## 2019-02-09 DIAGNOSIS — Z23 Encounter for immunization: Secondary | ICD-10-CM | POA: Diagnosis not present

## 2019-02-09 DIAGNOSIS — Z114 Encounter for screening for human immunodeficiency virus [HIV]: Secondary | ICD-10-CM

## 2019-02-09 DIAGNOSIS — N979 Female infertility, unspecified: Secondary | ICD-10-CM

## 2019-02-09 DIAGNOSIS — E538 Deficiency of other specified B group vitamins: Secondary | ICD-10-CM

## 2019-02-09 DIAGNOSIS — R7309 Other abnormal glucose: Secondary | ICD-10-CM | POA: Diagnosis not present

## 2019-02-09 MED ORDER — OMEPRAZOLE 20 MG PO CPDR: 20.0000 mg | DELAYED_RELEASE_CAPSULE | Freq: Every day | ORAL | 5 refills | Status: DC

## 2019-02-09 NOTE — Assessment & Plan Note (Signed)
Followed by Gyn Encouraged continuing prenatal vitamin with folate Encouraged weight loss

## 2019-02-09 NOTE — Assessment & Plan Note (Signed)
Patient reported Will check level today If normal or elevated, may not need to take B12 supplement

## 2019-02-09 NOTE — Progress Notes (Signed)
Patient: Kelsey Hughes, Female    DOB: 03/07/1981, 38 y.o.   MRN: [id number] Visit Date: 02/09/2019  Today's Provider: Lavon Paganini, MD   Chief Complaint  Patient presents with  . New Patient (Initial Visit)   Subjective:     Annual physical exam Kelsey Hughes is a 38 y.o. female who presents today for new patient visit.   No previous PCP for many years.  Multiple skin tags Get caught on collar and necklaces All around neck and on bra line Present for many years  Allergic rhinitis: wakes up with itchy eyes, coughing, post-nasal drip Better when taking OTC antihistamine Wants to know if she should continue taking it  Sees GYN (Dr Ernestina Patches) for infertility. States that it is contributed to by her weight and low egg count due to her age  4 episodes of food sticking in esophagus in last week since stopping Omeprazole.  She was previously well controlled. -----------------------------------------------------------------   Review of Systems  Constitutional: Negative.   HENT: Positive for congestion.   Eyes: Positive for itching.  Respiratory: Positive for cough.   Cardiovascular: Negative.   Gastrointestinal: Negative.   Endocrine: Negative.   Genitourinary: Negative.   Musculoskeletal: Negative.   Skin: Negative.   Allergic/Immunologic: Negative.   Neurological: Positive for headaches.  Hematological: Negative.   Psychiatric/Behavioral: Negative.     Social History      She  reports that she has never smoked. She has never used smokeless tobacco. She reports that she does not drink alcohol or use drugs.       Social History   Socioeconomic History  . Marital status: Single    Spouse name: Not on file  . Number of children: Not on file  . Years of education: Not on file  . Highest education level: Not on file  Occupational History  . Not on file  Social Needs  . Financial resource strain: Not on file  . Food insecurity    Worry: Not on file   Inability: Not on file  . Transportation needs    Medical: Not on file    Non-medical: Not on file  Tobacco Use  . Smoking status: Never Smoker  . Smokeless tobacco: Never Used  Substance and Sexual Activity  . Alcohol use: No  . Drug use: No  . Sexual activity: Yes    Birth control/protection: None  Lifestyle  . Physical activity    Days per week: Not on file    Minutes per session: Not on file  . Stress: Not on file  Relationships  . Social Herbalist on phone: Not on file    Gets together: Not on file    Attends religious service: Not on file    Active member of club or organization: Not on file    Attends meetings of clubs or organizations: Not on file    Relationship status: Not on file  Other Topics Concern  . Not on file  Social History Narrative  . Not on file    Past Medical History:  Diagnosis Date  . Allergy   . Anemia   . Infertility, female      Patient Active Problem List   Diagnosis Date Noted  . Anemia 11/03/2013  . Morbid obesity ([place]) 09/18/2013  . Impaired glucose metabolism 09/18/2013  . ANTERIOR CRUCIATE LIGAMENT TEAR, LEFT KNEE 06/22/1995    Past Surgical History:  Procedure Laterality Date  . ANTERIOR CRUCIATE  LIGAMENT REPAIR    . DILATION AND CURETTAGE, DIAGNOSTIC / THERAPEUTIC      Family History        Family Status  Relation Name Status  . Father  Deceased  . Mother  Alive        Her family history includes Atrial fibrillation in her mother; Heart disease in her father; Hypertension in her father and mother; Sleep apnea in her mother; Stroke in her father.      Allergies  Allergen Reactions  . Sulfonamide Derivatives     REACTION: ??? reaction during childhood     Current Outpatient Medications:  .  cholecalciferol (VITAMIN D3) 25 MCG (1000 UT) tablet, Take 1,000 Units by mouth daily., Disp: , Rfl:  .  folic acid (FOLVITE) 1 MG tablet, Take 1 mg by mouth daily., Disp: , Rfl:  .  Multiple Vitamin  (MULTIVITAMIN) tablet, Take 1 tablet by mouth daily., Disp: , Rfl:  .  Omega-3 Fatty Acids (FISH OIL PO), Take by mouth., Disp: , Rfl:  .  omeprazole (PRILOSEC) 20 MG capsule, Take 1 capsule (20 mg total) by mouth daily. PATIENT NEEDS OFFICE VISIT FOR ADDITIONAL REFILLS, Disp: 30 capsule, Rfl: 0 .  vitamin B-12 (CYANOCOBALAMIN) 1000 MCG tablet, Take 1,000 mcg by mouth daily., Disp: , Rfl:    Patient Care Team: Virginia Crews, MD as PCP - General (Family Medicine)    Objective:    Vitals: BP 121/83 (BP Location: Left Arm, Patient Position: Sitting, Cuff Size: Large)   Pulse 73   Temp (!) 97.3 F (36.3 C) (Temporal)   Resp 16   Ht 5\' 5"  (1.651 m)   Wt 261 lb 6.4 oz (118.6 kg)   LMP 01/17/2019 (Exact Date)   BMI 43.50 kg/m    Vitals:   02/09/19 1009  BP: 121/83  Pulse: 73  Resp: 16  Temp: (!) 97.3 F (36.3 C)  TempSrc: Temporal  Weight: 261 lb 6.4 oz (118.6 kg)  Height: 5\' 5"  (1.651 m)     Physical Exam Vitals signs reviewed.  Constitutional:      General: She is not in acute distress.    Appearance: Normal appearance. She is well-developed. She is not diaphoretic.  HENT:     Head: Normocephalic and atraumatic.     Right Ear: Tympanic membrane, ear canal and external ear normal.     Left Ear: Tympanic membrane, ear canal and external ear normal.  Eyes:     General: No scleral icterus.    Extraocular Movements: Extraocular movements intact.     Conjunctiva/sclera: Conjunctivae normal.     Pupils: Pupils are equal, round, and reactive to light.  Neck:     Musculoskeletal: Neck supple.     Thyroid: No thyromegaly.  Cardiovascular:     Rate and Rhythm: Normal rate and regular rhythm.     Pulses: Normal pulses.     Heart sounds: Normal heart sounds. No murmur.  Pulmonary:     Effort: Pulmonary effort is normal. No respiratory distress.     Breath sounds: Normal breath sounds. No wheezing or rales.  Abdominal:     General: Bowel sounds are normal. There is no  distension.     Palpations: Abdomen is soft.     Tenderness: There is no abdominal tenderness. There is no guarding or rebound.  Musculoskeletal:        General: No deformity.     Right lower leg: No edema.     Left lower leg: No  edema.  Lymphadenopathy:     Cervical: No cervical adenopathy.  Skin:    General: Skin is warm and dry.     Capillary Refill: Capillary refill takes less than 2 seconds.     Findings: No rash.     Comments: Multiple skin tags around the neckline  Neurological:     Mental Status: She is alert and oriented to person, place, and time. Mental status is at baseline.  Psychiatric:        Mood and Affect: Mood normal.        Behavior: Behavior normal.        Thought Content: Thought content normal.      Depression Screen PHQ 2/9 Scores 02/09/2019 09/18/2013  PHQ - 2 Score 2 0  PHQ- 9 Score 5 -       Assessment & Plan:     Establish care  Exercise Activities and Dietary recommendations Goals   None      There is no immunization history on file for this patient.  Health Maintenance  Topic Date Due  . HIV Screening  06/29/1993  . TETANUS/TDAP  06/29/1997  . INFLUENZA VACCINE  01/20/2019  . PAP SMEAR-Modifier  11/18/2019     Discussed health benefits of physical activity, and encouraged her to engage in regular exercise appropriate for her age and condition.    Plan to update Tdap at next visit as she did not want to get it with her flu shot today --------------------------------------------------------------------  Problem List Items Addressed This Visit      Genitourinary   Infertility, female    Followed by Gyn Encouraged continuing prenatal vitamin with folate Encouraged weight loss        Other   Morbid obesity ([place]) - Primary    Discussed importance of healthy weight management Discussed diet and exercise       Relevant Orders   Lipid panel   Comprehensive metabolic panel   Impaired glucose metabolism    Reviewed last  A1c from last month of 5.7 with patient Discussed the difference between prediabetes and diabetes Discussed importance of preventing progression to diabetes Discussed importance of low-carb diet and exercise      Relevant Orders   Comprehensive metabolic panel   123456 deficiency    Patient reported Will check level today If normal or elevated, may not need to take B12 supplement      Relevant Orders   B12   Avitaminosis D    Patient reported We will check level today May not need vitamin D supplement pending level      Relevant Orders   VITAMIN D 25 Hydroxy (Vit-D Deficiency, Fractures)    Other Visit Diagnoses    Need for influenza vaccination       Relevant Orders   Flu Vaccine QUAD 36+ mos IM (Completed)   Screening for HIV (human immunodeficiency virus)       Relevant Orders   HIV antibody (with reflex)       Return in about 6 months (around 08/12/2019) for CPE. As well as visit for skin tag removal at her convenience   The entirety of the information documented in the History of Present Illness, Review of Systems and Physical Exam were personally obtained by me. Portions of this information were initially documented by Lynford Humphrey, CMA and reviewed by me for thoroughness and accuracy.    Korry Dalgleish, Dionne Bucy, MD MPH [facility]

## 2019-02-09 NOTE — Assessment & Plan Note (Signed)
Reviewed last A1c from last month of 5.7 with patient Discussed the difference between prediabetes and diabetes Discussed importance of preventing progression to diabetes Discussed importance of low-carb diet and exercise

## 2019-02-09 NOTE — Assessment & Plan Note (Signed)
Discussed importance of healthy weight management Discussed diet and exercise  

## 2019-02-09 NOTE — Patient Instructions (Signed)
Skin Tag, Adult  A skin tag (acrochordon) is a soft, extra growth of skin. Most skin tags are flesh-colored and rarely bigger than a pencil eraser. They commonly form near areas where there are folds in the skin, such as the armpit or groin. Skin tags are not dangerous, and they do not spread from person to person (are not contagious). You may have one skin tag or several. Skin tags do not require treatment. However, your health care provider may recommend removal of a skin tag if it:  Gets irritated from clothing.  Bleeds.  Is visible and unsightly. Your health care provider can remove skin tags with a simple surgical procedure or a procedure that involves freezing the skin tag. Follow these instructions at home:  Watch for any changes in your skin tag. A normal skin tag does not require any other special care at home.  Take over-the-counter and prescription medicines only as told by your health care provider.  Keep all follow-up visits as told by your health care provider. This is important. Contact a health care provider if:  You have a skin tag that: ? Becomes painful. ? Changes color. ? Bleeds. ? Swells.  You develop more skin tags. This information is not intended to replace advice given to you by your health care provider. Make sure you discuss any questions you have with your health care provider. Document Released: 06/22/2015 Document Revised: 05/20/2017 Document Reviewed: 06/22/2015 Elsevier Patient Education  2020 Elsevier Inc.  

## 2019-02-09 NOTE — Assessment & Plan Note (Signed)
Patient reported We will check level today May not need vitamin D supplement pending level

## 2019-02-10 LAB — HIV ANTIBODY (ROUTINE TESTING W REFLEX): HIV Screen 4th Generation wRfx: NONREACTIVE

## 2019-02-10 LAB — COMPREHENSIVE METABOLIC PANEL
ALT: 15 IU/L (ref 0–32)
AST: 12 IU/L (ref 0–40)
Albumin/Globulin Ratio: 1.6 (ref 1.2–2.2)
Albumin: 4.1 g/dL (ref 3.8–4.8)
Alkaline Phosphatase: 65 IU/L (ref 39–117)
BUN/Creatinine Ratio: 13 (ref 9–23)
BUN: 10 mg/dL (ref 6–24)
Bilirubin Total: 0.3 mg/dL (ref 0.0–1.2)
CO2: 25 mmol/L (ref 20–29)
Calcium: 8.9 mg/dL (ref 8.7–10.2)
Chloride: 104 mmol/L (ref 96–106)
Creatinine, Ser: 0.79 mg/dL (ref 0.57–1.00)
GFR calc Af Amer: 108 mL/min/{1.73_m2} (ref >59)
GFR calc non Af Amer: 94 mL/min/{1.73_m2} (ref >59)
Globulin, Total: 2.5 g/dL (ref 1.5–4.5)
Glucose: 84 mg/dL (ref 65–99)
Potassium: 4.2 mmol/L (ref 3.5–5.2)
Sodium: 140 mmol/L (ref 134–144)
Total Protein: 6.6 g/dL (ref 6.0–8.5)

## 2019-02-10 LAB — LIPID PANEL
Chol/HDL Ratio: 5.3 ratio — ABNORMAL HIGH (ref 0.0–4.4)
Cholesterol, Total: 202 mg/dL — ABNORMAL HIGH (ref 100–199)
HDL: 38 mg/dL — ABNORMAL LOW (ref >39)
LDL Calculated: 144 mg/dL — ABNORMAL HIGH (ref 0–99)
Triglycerides: 102 mg/dL (ref 0–149)
VLDL Cholesterol Cal: 20 mg/dL (ref 5–40)

## 2019-02-10 LAB — VITAMIN B12: Vitamin B-12: 614 pg/mL (ref 232–1245)

## 2019-02-10 LAB — VITAMIN D 25 HYDROXY (VIT D DEFICIENCY, FRACTURES): Vit D, 25-Hydroxy: 37.1 ng/mL (ref 30.0–100.0)

## 2019-03-12 ENCOUNTER — Encounter: Payer: Self-pay | Admitting: Family Medicine

## 2019-03-12 ENCOUNTER — Ambulatory Visit (INDEPENDENT_AMBULATORY_CARE_PROVIDER_SITE_OTHER): Payer: Medicaid Other | Admitting: Family Medicine

## 2019-03-12 ENCOUNTER — Other Ambulatory Visit: Payer: Self-pay

## 2019-03-12 VITALS — BP 133/86 | HR 85 | Temp 97.3°F | Wt 264.8 lb

## 2019-03-12 DIAGNOSIS — D489 Neoplasm of uncertain behavior, unspecified: Secondary | ICD-10-CM | POA: Diagnosis not present

## 2019-03-12 NOTE — Progress Notes (Signed)
Patient: Kelsey Hughes Female    DOB: December 07, 1980   38 y.o.   MRN: [id number] Visit Date: 03/12/2019  Today's Provider: Lavon Paganini, MD   Chief Complaint  Patient presents with  . Skin Problem   Subjective:    I, Porsha McClurkin CMA, am acting as a scribe for Lavon Paganini, MD.    HPI  Skin Problem Patient presents today for skin tags for some years. Patient states that they are located on her neck and left/right upper side. Patient states that they are painful.  They catch on her shirt collar and tear off occasionally.   Allergies  Allergen Reactions  . Sulfonamide Derivatives     REACTION: ??? reaction during childhood     Current Outpatient Medications:  .  cholecalciferol (VITAMIN D3) 25 MCG (1000 UT) tablet, Take 1,000 Units by mouth daily., Disp: , Rfl:  .  folic acid (FOLVITE) 1 MG tablet, Take 1 mg by mouth daily., Disp: , Rfl:  .  Multiple Vitamin (MULTIVITAMIN) tablet, Take 1 tablet by mouth daily., Disp: , Rfl:  .  Omega-3 Fatty Acids (FISH OIL PO), Take by mouth., Disp: , Rfl:  .  omeprazole (PRILOSEC) 20 MG capsule, Take 1 capsule (20 mg total) by mouth daily. PATIENT NEEDS OFFICE VISIT FOR ADDITIONAL REFILLS, Disp: 30 capsule, Rfl: 5 .  vitamin B-12 (CYANOCOBALAMIN) 1000 MCG tablet, Take 1,000 mcg by mouth daily., Disp: , Rfl:   Review of Systems  Constitutional: Negative.   Respiratory: Negative.   Genitourinary: Negative.   Hematological: Negative.     Social History   Tobacco Use  . Smoking status: Never Smoker  . Smokeless tobacco: Never Used  Substance Use Topics  . Alcohol use: Yes    Comment: occasional, not every week      Objective:   BP 133/86 (BP Location: Left Arm, Patient Position: Sitting, Cuff Size: Normal)   Pulse 85   Temp (!) 97.3 F (36.3 C) (Temporal)   Wt 264 lb 12.8 oz (120.1 kg)   LMP 03/07/2019   SpO2 97%   BMI 44.07 kg/m  Vitals:   03/12/19 1556  BP: 133/86  Pulse: 85  Temp: (!) 97.3 F (36.3 C)   TempSrc: Temporal  SpO2: 97%  Weight: 264 lb 12.8 oz (120.1 kg)  Body mass index is 44.07 kg/m.   Physical Exam Vitals signs reviewed.  Constitutional:      General: She is not in acute distress.    Appearance: Normal appearance.  HENT:     Head: Normocephalic and atraumatic.  Eyes:     General: No scleral icterus.    Conjunctiva/sclera: Conjunctivae normal.  Cardiovascular:     Rate and Rhythm: Normal rate and regular rhythm.  Pulmonary:     Effort: Pulmonary effort is normal. No respiratory distress.  Skin:    General: Skin is warm and dry.     Capillary Refill: Capillary refill takes less than 2 seconds.     Findings: No rash.     Comments: Multiple skin tgs on bilateral sides of neck  Neurological:     Mental Status: She is alert and oriented to person, place, and time. Mental status is at baseline.  Psychiatric:        Mood and Affect: Mood normal.        Behavior: Behavior normal.      No results found for any visits on 03/12/19.     Assessment & Plan   1. Neoplasm of  uncertain behavior - painful and irritated skin tags - no pathology to be sent - Procedure: Patient was given informed consent,. Appropriate time out was taken. Area prepped and draped in usual sterile fashion. 1 cc of  1% lidocaine without epinephrine was injected into the base of several skin tags on R side of neck.  Ethyl chloride spray was used for topical anesthesia on other smaller skin tags.  Tags were grasped with pickups and clipped at base with scissors.   The patient became diaphoretic after removal of 10 tags.  She was laid down and given water and improved.  She will return for further removal. There were no complications. Post procedure instructions were given.    No follow-ups on file.   The entirety of the information documented in the History of Present Illness, Review of Systems and Physical Exam were personally obtained by me. Portions of this information were initially  documented by [facility], CMA and reviewed by me for thoroughness and accuracy.    , Dionne Bucy, MD MPH [facility]

## 2019-03-12 NOTE — Patient Instructions (Signed)
Skin Tag, Adult  A skin tag (acrochordon) is a soft, extra growth of skin. Most skin tags are flesh-colored and rarely bigger than a pencil eraser. They commonly form near areas where there are folds in the skin, such as the armpit or groin. Skin tags are not dangerous, and they do not spread from person to person (are not contagious). You may have one skin tag or several. Skin tags do not require treatment. However, your health care provider may recommend removal of a skin tag if it:  Gets irritated from clothing.  Bleeds.  Is visible and unsightly. Your health care provider can remove skin tags with a simple surgical procedure or a procedure that involves freezing the skin tag. Follow these instructions at home:  Watch for any changes in your skin tag. A normal skin tag does not require any other special care at home.  Take over-the-counter and prescription medicines only as told by your health care provider.  Keep all follow-up visits as told by your health care provider. This is important. Contact a health care provider if:  You have a skin tag that: ? Becomes painful. ? Changes color. ? Bleeds. ? Swells.  You develop more skin tags. This information is not intended to replace advice given to you by your health care provider. Make sure you discuss any questions you have with your health care provider. Document Released: 06/22/2015 Document Revised: 05/20/2017 Document Reviewed: 06/22/2015 Elsevier Patient Education  2020 Elsevier Inc.  

## 2019-03-13 ENCOUNTER — Encounter: Payer: Self-pay | Admitting: Family Medicine

## 2019-03-26 ENCOUNTER — Encounter: Payer: Self-pay | Admitting: Family Medicine

## 2019-03-26 ENCOUNTER — Other Ambulatory Visit: Payer: Self-pay

## 2019-03-26 ENCOUNTER — Ambulatory Visit: Payer: Medicaid Other | Admitting: Family Medicine

## 2019-03-26 VITALS — BP 141/88 | HR 68 | Temp 97.3°F | Wt 265.8 lb

## 2019-03-26 DIAGNOSIS — L2084 Intrinsic (allergic) eczema: Secondary | ICD-10-CM | POA: Diagnosis not present

## 2019-03-26 DIAGNOSIS — D489 Neoplasm of uncertain behavior, unspecified: Secondary | ICD-10-CM | POA: Diagnosis not present

## 2019-03-26 MED ORDER — TRIAMCINOLONE ACETONIDE 0.5 % EX OINT: 1.0000 "application " | TOPICAL_OINTMENT | Freq: Two times a day (BID) | CUTANEOUS | 3 refills | Status: DC

## 2019-03-26 NOTE — Progress Notes (Signed)
Patient: Kelsey Hughes Female    DOB: Jan 26, 1981   38 y.o.   MRN: [id number] Visit Date: 03/26/2019  Today's Provider: Lavon Paganini, MD   Chief Complaint  Patient presents with  . Skin tag removal   Subjective:    I, Tiburcio Pea, CMA, am acting as a scribe for Lavon Paganini, MD.   HPI Skin tags:  Patient presents today for skin tag removal. She continues to have issure with skin tags hurting, growing and catching on jewelry and clothes.  Patient also has patchy, itchy rash on bilateral legs.  Seems to worsen when the weather gets colder each year.  Mom has similar rash. She has used her steroid ointment in the past and it helped.  Allergies  Allergen Reactions  . Sulfonamide Derivatives     REACTION: ??? reaction during childhood     Current Outpatient Medications:  .  cholecalciferol (VITAMIN D3) 25 MCG (1000 UT) tablet, Take 1,000 Units by mouth daily., Disp: , Rfl:  .  folic acid (FOLVITE) 1 MG tablet, Take 1 mg by mouth daily., Disp: , Rfl:  .  Multiple Vitamin (MULTIVITAMIN) tablet, Take 1 tablet by mouth daily., Disp: , Rfl:  .  Omega-3 Fatty Acids (FISH OIL PO), Take by mouth., Disp: , Rfl:  .  omeprazole (PRILOSEC) 20 MG capsule, Take 1 capsule (20 mg total) by mouth daily. PATIENT NEEDS OFFICE VISIT FOR ADDITIONAL REFILLS, Disp: 30 capsule, Rfl: 5 .  vitamin B-12 (CYANOCOBALAMIN) 1000 MCG tablet, Take 1,000 mcg by mouth daily., Disp: , Rfl:   Review of Systems  Constitutional: Negative.   Respiratory: Negative.   Cardiovascular: Negative.   Musculoskeletal: Negative.     Social History   Tobacco Use  . Smoking status: Never Smoker  . Smokeless tobacco: Never Used  Substance Use Topics  . Alcohol use: Yes    Comment: occasional, not every week      Objective:   BP (!) 141/88 (BP Location: Right Arm, Patient Position: Sitting, Cuff Size: Large)   Pulse 68   Temp (!) 97.3 F (36.3 C) (Temporal)   Wt 265 lb 12.8 oz (120.6 kg)   LMP  03/07/2019   SpO2 98%   BMI 44.23 kg/m  Vitals:   03/26/19 1617  BP: (!) 141/88  Pulse: 68  Temp: (!) 97.3 F (36.3 C)  TempSrc: Temporal  SpO2: 98%  Weight: 265 lb 12.8 oz (120.6 kg)  Body mass index is 44.23 kg/m.   Physical Exam Vitals signs reviewed.  Constitutional:      General: She is not in acute distress.    Appearance: Normal appearance.  HENT:     Head: Normocephalic and atraumatic.  Cardiovascular:     Rate and Rhythm: Normal rate and regular rhythm.  Pulmonary:     Effort: Pulmonary effort is normal. No respiratory distress.  Skin:    General: Skin is warm and dry.     Capillary Refill: Capillary refill takes less than 2 seconds.     Findings: Rash (eczema on back on both thighs) present.     Comments: Multiple skin tags of varying size across neck and bra line  Neurological:     Mental Status: She is alert and oriented to person, place, and time. Mental status is at baseline.  Psychiatric:        Mood and Affect: Mood normal.        Behavior: Behavior normal.      No results  found for any visits on 03/26/19.     Assessment & Plan   1. Neoplasm of uncertain behavior - painful and irritated skin tags - no pathology sent  - Procedure: Patient was given informed consent,. Appropriate time out was taken. Area prepped and draped in usual sterile fashion.  Ethyl chloride spray was used for topical anesthesia.  Tags were grasped with pickups and clipped at base with scissors.   Patient laid down for procedure.  37 skin tags removed from neck.  She will return for further removal. There were no complications. Post procedure instructions were given.   2. Intrinsic eczema - treat with steroid cream BID prn - no signs of infection   Meds ordered this encounter  Medications  . triamcinolone ointment (KENALOG) 0.5 %    Sig: Apply 1 application topically 2 (two) times daily.    Dispense:  30 g    Refill:  3     Return in about 2 weeks (around  04/09/2019) for skin tag removal.   The entirety of the information documented in the History of Present Illness, Review of Systems and Physical Exam were personally obtained by me. Portions of this information were initially documented by Tiburcio Pea, CMA and reviewed by me for thoroughness and accuracy.    Romona Murdy, Dionne Bucy, MD MPH [facility]  Removed 37 skin tags Eczema

## 2019-03-26 NOTE — Patient Instructions (Signed)

## 2019-04-09 ENCOUNTER — Other Ambulatory Visit: Payer: Self-pay

## 2019-04-09 ENCOUNTER — Ambulatory Visit: Payer: Medicaid Other | Admitting: Family Medicine

## 2019-04-09 VITALS — BP 138/84 | HR 85 | Temp 97.7°F | Wt 263.0 lb

## 2019-04-09 DIAGNOSIS — D489 Neoplasm of uncertain behavior, unspecified: Secondary | ICD-10-CM

## 2019-04-09 NOTE — Patient Instructions (Signed)
Skin Tag, Adult  A skin tag (acrochordon) is a soft, extra growth of skin. Most skin tags are flesh-colored and rarely bigger than a pencil eraser. They commonly form near areas where there are folds in the skin, such as the armpit or groin. Skin tags are not dangerous, and they do not spread from person to person (are not contagious). You may have one skin tag or several. Skin tags do not require treatment. However, your health care provider may recommend removal of a skin tag if it:  Gets irritated from clothing.  Bleeds.  Is visible and unsightly. Your health care provider can remove skin tags with a simple surgical procedure or a procedure that involves freezing the skin tag. Follow these instructions at home:  Watch for any changes in your skin tag. A normal skin tag does not require any other special care at home.  Take over-the-counter and prescription medicines only as told by your health care provider.  Keep all follow-up visits as told by your health care provider. This is important. Contact a health care provider if:  You have a skin tag that: ? Becomes painful. ? Changes color. ? Bleeds. ? Swells.  You develop more skin tags. This information is not intended to replace advice given to you by your health care provider. Make sure you discuss any questions you have with your health care provider. Document Released: 06/22/2015 Document Revised: 05/20/2017 Document Reviewed: 06/22/2015 Elsevier Patient Education  2020 Elsevier Inc.  

## 2019-04-09 NOTE — Progress Notes (Signed)
Patient: Kelsey Hughes Female    DOB: 01-Nov-1980   38 y.o.   MRN: [id number] Visit Date: 04/09/2019  Today's Provider: Lavon Paganini, MD   No chief complaint on file.  Subjective:     HPI   Pt coming in today to remove skin tags.  She is already has several removed around her neck.  Today, she is concerned about them around her breasts and axilla.  She denies any changes in lesions, discoloration, drainage, fever.   Allergies  Allergen Reactions  . Sulfonamide Derivatives     REACTION: ??? reaction during childhood     Current Outpatient Medications:  .  folic acid (FOLVITE) 1 MG tablet, Take 1 mg by mouth daily., Disp: , Rfl:  .  Multiple Vitamin (MULTIVITAMIN) tablet, Take 1 tablet by mouth daily., Disp: , Rfl:  .  Omega-3 Fatty Acids (FISH OIL PO), Take by mouth., Disp: , Rfl:  .  omeprazole (PRILOSEC) 20 MG capsule, Take 1 capsule (20 mg total) by mouth daily. PATIENT NEEDS OFFICE VISIT FOR ADDITIONAL REFILLS, Disp: 30 capsule, Rfl: 5 .  cholecalciferol (VITAMIN D3) 25 MCG (1000 UT) tablet, Take 1,000 Units by mouth daily., Disp: , Rfl:  .  triamcinolone ointment (KENALOG) 0.5 %, Apply 1 application topically 2 (two) times daily. (Patient not taking: Reported on 04/09/2019), Disp: 30 g, Rfl: 3 .  vitamin B-12 (CYANOCOBALAMIN) 1000 MCG tablet, Take 1,000 mcg by mouth daily., Disp: , Rfl:   Review of Systems  Constitutional: Negative.   Respiratory: Negative.   Cardiovascular: Negative.   Skin: Negative.   Neurological: Negative.     Social History   Tobacco Use  . Smoking status: Never Smoker  . Smokeless tobacco: Never Used  Substance Use Topics  . Alcohol use: Yes    Comment: occasional, not every week      Objective:   BP 138/84 (BP Location: Left Arm, Patient Position: Sitting, Cuff Size: Large)   Pulse 85   Temp 97.7 F (36.5 C) (Temporal)   Wt 263 lb (119.3 kg)   BMI 43.77 kg/m  Vitals:   04/09/19 1617  BP: 138/84  Pulse: 85  Temp:  97.7 F (36.5 C)  TempSrc: Temporal  Weight: 263 lb (119.3 kg)  Body mass index is 43.77 kg/m.   Physical Exam Vitals signs reviewed.  Constitutional:      General: She is not in acute distress.    Appearance: Normal appearance. She is not diaphoretic.  HENT:     Head: Normocephalic and atraumatic.  Eyes:     Conjunctiva/sclera: Conjunctivae normal.  Cardiovascular:     Rate and Rhythm: Normal rate and regular rhythm.  Pulmonary:     Effort: Pulmonary effort is normal. No respiratory distress.  Skin:    General: Skin is warm and dry.     Capillary Refill: Capillary refill takes less than 2 seconds.     Findings: No rash.     Comments: Multiple skin tags bilateral axilla and beneath bilateral breasts  Neurological:     Mental Status: She is alert and oriented to person, place, and time. Mental status is at baseline.  Psychiatric:        Mood and Affect: Mood normal.        Behavior: Behavior normal.      No results found for any visits on 04/09/19.     Assessment & Plan   1. Neoplasm of uncertain behavior -Painful and near septated skin tags -  No pathology sent  - Procedure: Patient was given informed consent,. Appropriate time out was taken. Area prepped and draped in usual sterile fashion.Ethyl chloride spray was used for topical anesthesia. Tags were grasped with pickups and clipped at base with scissors. Patient laid down for procedure.  15 skin tags removed from bilateral axilla, including 2 from beneath left breast. There were no complications. Post procedure instructions were given.    Return if symptoms worsen or fail to improve.   The entirety of the information documented in the History of Present Illness, Review of Systems and Physical Exam were personally obtained by me. Portions of this information were initially documented by Ashley Royalty, CMA and reviewed by me for thoroughness and accuracy.    Sakia Schrimpf, Dionne Bucy, MD MPH [facility]

## 2019-05-02 ENCOUNTER — Other Ambulatory Visit: Payer: Self-pay | Admitting: Family Medicine

## 2019-05-02 DIAGNOSIS — B372 Candidiasis of skin and nail: Secondary | ICD-10-CM

## 2019-06-27 ENCOUNTER — Other Ambulatory Visit: Payer: Self-pay

## 2019-06-27 ENCOUNTER — Ambulatory Visit (INDEPENDENT_AMBULATORY_CARE_PROVIDER_SITE_OTHER): Payer: Medicaid Other | Admitting: Family Medicine

## 2019-06-27 ENCOUNTER — Encounter: Payer: Self-pay | Admitting: Family Medicine

## 2019-06-27 VITALS — BP 143/84 | HR 88 | Temp 97.6°F | Resp 16 | Wt 275.0 lb

## 2019-06-27 DIAGNOSIS — L03213 Periorbital cellulitis: Secondary | ICD-10-CM

## 2019-06-27 DIAGNOSIS — K1120 Sialoadenitis, unspecified: Secondary | ICD-10-CM

## 2019-06-27 MED ORDER — AMOXICILLIN-POT CLAVULANATE 875-125 MG PO TABS: 1.0000 | ORAL_TABLET | Freq: Two times a day (BID) | ORAL | 0 refills | Status: AC

## 2019-06-27 NOTE — Patient Instructions (Signed)
Salivary Gland Infection  A salivary gland infection is an infection in one or more of the glands that produce saliva. You have six major salivary glands. Each gland has a duct that carries saliva into your mouth. Saliva keeps your mouth moist and breaks down the food that you eat. It also helps prevent tooth decay. Two salivary glands are located just in front of your ears (parotid). The ducts for these glands open up inside your cheeks, near your back teeth. You also have two glands under your tongue (sublingual) and two glands under your jaw (submandibular). The ducts for these glands open under your tongue. Any salivary gland can become infected. Most infections occur in the parotid glands or submandibular glands. What are the causes? This condition may be caused by bacteria or viruses.  The bacteria that cause salivary gland infections are usually the same bacteria that normally live in your mouth. ? A stone can form in a salivary gland and block the flow of saliva. As a result, saliva backs up into the salivary gland. Bacteria may then start to grow behind the blockage and cause infection. ? Bacterial infections usually cause pain and swelling on one side of the face. Submandibular gland swelling occurs under the jaw. Parotid swelling occurs in front of the ear. ? Bacterial infections are more common in adults.  The mumps virus is the most common cause of viral salivary gland infections. However, mumps is now rare because of vaccination. ? This infection causes swelling in both parotid glands. ? Viral infections are more common in children. What increases the risk? The following factors may make you more likely to develop a bacterial infection:  Not taking good care of your mouth and teeth (poor oral hygiene).  Smoking.  Not drinking enough water.  Having a disease that causes dry mouth and dry eyes (Mikulicz syndrome or Sjogren syndrome). A viral infection is more likely to occur in  children who do not get the MMR (measles, mumps, rubella) vaccine. What are the signs or symptoms? The main sign of a salivary gland infection is a swollen salivary gland. This type of inflammation is often called sialadenitis. You may have swelling in front of your ear, under your jaw, or under your tongue. Swelling may get worse when you eat and decrease after you eat. Other signs and symptoms include:  Pain.  Tenderness.  Redness.  Dry mouth.  Bad taste in your mouth.  Difficulty chewing and swallowing.  Fever. How is this diagnosed? This condition may be diagnosed based on:  Your signs and symptoms.  A physical exam. During the exam, your health care provider will look and feel inside your mouth to see whether a stone is blocking a salivary gland duct.  Tests, such as: ? An X-ray to check for a stone. ? An ultrasound, CT scan, or MRI to look for an abscess and to rule out other causes of swelling. ? A culture and sensitivity test. In this test, a sample of pus is taken from the salivary gland with a swab or by using a needle (aspiration). The sample is tested in a lab to determine the type of bacteria that is growing and which antibiotic medicines will work against it. You may need to see an ear, nose, and throat specialist (ENT or otolaryngologist) for diagnosis and treatment. How is this treated? Viral salivary gland infections usually clear up without treatment. Bacterial infections are usually treated with antibiotic medicine. Severe infections that cause difficulty with swallowing   may be treated with an IV antibiotic in the hospital. Other treatments may include:  Probing and widening the salivary duct to allow a stone to pass. In some cases, a thin, flexible scope (endoscope) may be inserted into the duct to find a stone and remove it.  Breaking up a stone using sound waves.  Draining an infected gland (abscess) with a needle.  Surgery to: ? Remove a stone. ? Drain  pus from an abscess. ? Remove a badly infected gland. Follow these instructions at home:  Medicines  Take over-the-counter and prescription medicines only as told by your health care provider.  If you were prescribed an antibiotic medicine, take it as told by your health care provider. Do not stop taking the antibiotic even if you start to feel better. To relieve discomfort  Follow these instructions every few hours: ? Suck on a lemon candy to stimulate the flow of saliva. ? Put a warm compress over the gland. ? Gently massage the gland.  Rinse your mouth with a salt-water mixture 3-4 times a day or as needed. To make a salt-water mixture, completely dissolve -1 tsp of salt in 1 cup of warm water. General instructions  Practice good oral hygiene by brushing and flossing your teeth after meals and before you go to bed.  Drink enough fluid to keep your urine pale yellow.  Do not use any products that contain nicotine or tobacco, such as cigarettes and e-cigarettes. If you need help quitting, ask your health care provider.  Keep all follow-up visits as told by your health care provider. This is important. Contact a health care provider if:  You have pain and swelling in your face, jaw, or mouth after eating.  You have persistent swelling in any of these places: ? In front of your ear. ? Under your jaw. ? Inside your mouth. Get help right away if:  You have pain and swelling in your face, jaw, or mouth, and this is getting worse.  Your pain and swelling make it hard to swallow or breathe. Summary  A salivary gland infection is an infection in one or more of the glands that produce saliva. You have six major salivary glands. Each gland has a duct that carries saliva into your mouth.  Any salivary gland can become infected. Most infections occur in the glands just in front of your ears (parotid glands) or the glands under your jaw (submandibular glands).  This condition may be  caused by bacteria or viruses.  Salivary gland infections caused by a virus usually clear up without treatment. Bacterial infections are usually treated with antibiotic medicine. This information is not intended to replace advice given to you by your health care provider. Make sure you discuss any questions you have with your health care provider. Document Revised: 07/06/2017 Document Reviewed: 07/06/2017 Elsevier Patient Education  2020 [employer] Inc[employer]  

## 2019-06-27 NOTE — Progress Notes (Signed)
Patient: Kelsey Hughes Female    DOB: 11-Nov-1980   39 y.o.   MRN: [id number] Visit Date: 06/27/2019  Today's Provider: Lavon Paganini, MD   Chief Complaint  Patient presents with  . Facial Pain   Subjective:    I [place] S. Dimas, CMA, am acting as scribe for Lavon Paganini, MD.   HPI Patient here today C/O right facial swelling around eye, check and jaw. Patient reports lymph node swelling under her jawline on the right side. Patient reports symptoms have been present for 2-3 days. Patient denies any fever, cough, sore throat, or ear pain. Patient denies any visual disturbance. Patient denies taking any medications for pain or swelling.  She has never had anything like this previously.  There is some redness on her upper right eyelid.  She states she does work in the [employer] and there is a lot of dust in the air.  No pain with eye movement.  No eye redness.  No swallowing difficulties.   Allergies  Allergen Reactions  . Sulfonamide Derivatives     REACTION: ??? reaction during childhood     Current Outpatient Medications:  .  cholecalciferol (VITAMIN D3) 25 MCG (1000 UT) tablet, Take 1,000 Units by mouth daily., Disp: , Rfl:  .  folic acid (FOLVITE) 1 MG tablet, Take 1 mg by mouth daily., Disp: , Rfl:  .  Multiple Vitamin (MULTIVITAMIN) tablet, Take 1 tablet by mouth daily., Disp: , Rfl:  .  Omega-3 Fatty Acids (FISH OIL PO), Take by mouth., Disp: , Rfl:  .  omeprazole (PRILOSEC) 20 MG capsule, Take 1 capsule (20 mg total) by mouth daily. PATIENT NEEDS OFFICE VISIT FOR ADDITIONAL REFILLS, Disp: 30 capsule, Rfl: 5  Review of Systems  Constitutional: Negative for chills, fatigue and fever.  HENT: Positive for facial swelling. Negative for congestion, dental problem, drooling, ear discharge, ear pain, mouth sores, nosebleeds, postnasal drip, rhinorrhea, sinus pressure, sinus pain, sore throat and trouble swallowing.   Eyes: Positive for itching. Negative for  redness and visual disturbance.       Eyelid swelling and redness  Respiratory: Negative for cough, chest tightness, shortness of breath, wheezing and stridor.   Cardiovascular: Negative.   Skin: Positive for color change.  Neurological: Negative.     Social History   Tobacco Use  . Smoking status: Never Smoker  . Smokeless tobacco: Never Used  Substance Use Topics  . Alcohol use: Yes    Comment: occasional, not every week      Objective:   There were no vitals taken for this visit. There were no vitals filed for this visit.There is no height or weight on file to calculate BMI.   Physical Exam Constitutional:      General: She is not in acute distress.    Appearance: She is not diaphoretic.  HENT:     Head:     Salivary Glands: Right salivary gland is diffusely enlarged and tender. Left salivary gland is not diffusely enlarged or tender.     Comments: Swelling and tenderness of the right parotid gland as well as submandibular lymphadenopathy    Right Ear: External ear normal.     Left Ear: External ear normal.     Nose: Nose normal.     Mouth/Throat:     Mouth: Mucous membranes are moist.     Pharynx: No oropharyngeal exudate or posterior oropharyngeal erythema.  Eyes:     General: No scleral icterus.  Right eye: No discharge.        Left eye: No discharge.     Extraocular Movements: Extraocular movements intact.     Conjunctiva/sclera: Conjunctivae normal.     Pupils: Pupils are equal, round, and reactive to light.     Comments: Small laceration of the right upper eyelid with surrounding redness, tenderness, swelling  Cardiovascular:     Rate and Rhythm: Normal rate and regular rhythm.     Pulses: Normal pulses.     Heart sounds: Normal heart sounds. No murmur.  Pulmonary:     Effort: Pulmonary effort is normal. No respiratory distress.     Breath sounds: Normal breath sounds. No wheezing.  Musculoskeletal:     Cervical back: Neck supple.     Right lower  leg: No edema.     Left lower leg: No edema.  Lymphadenopathy:     Cervical: Cervical adenopathy present.  Skin:    General: Skin is warm and dry.     Capillary Refill: Capillary refill takes less than 2 seconds.     Findings: Erythema present. No rash.  Neurological:     Mental Status: She is alert and oriented to person, place, and time. Mental status is at baseline.  Psychiatric:        Mood and Affect: Mood normal.        Behavior: Behavior normal.      No results found for any visits on 06/27/19.     Assessment & Plan    1. Sialadenitis -New problem -Patient seems to have parotitis with concern for infection given local lymphadenopathy -Encouraged sialagogues, milking the gland, warm compresses -Treat with 7-day course of Augmentin -Discussed strict return precautions -If not improving with conservative measures, consider CT of the area and ENT referral  2. Periorbital cellulitis of right eye -New problem -Seems to be a small abrasion on the eyelid which is the point of entry for infection -Advised to avoid touching and scratching the area -The Augmentin that we are using to treat her parotitis will help with this problem as well -No red flag symptoms concerning for orbital cellulitis -Discussed return precautions   Meds ordered this encounter  Medications  . amoxicillin-clavulanate (AUGMENTIN) 875-125 MG tablet    Sig: Take 1 tablet by mouth 2 (two) times daily for 7 days.    Dispense:  14 tablet    Refill:  0     Return if symptoms worsen or fail to improve.   The entirety of the information documented in the History of Present Illness, Review of Systems and Physical Exam were personally obtained by me. Portions of this information were initially documented by Lynford Humphrey, CMA and reviewed by me for thoroughness and accuracy.    Columbia Pandey, Dionne Bucy, MD MPH [facility]

## 2019-07-11 ENCOUNTER — Other Ambulatory Visit: Payer: Self-pay | Admitting: Family Medicine

## 2019-07-11 DIAGNOSIS — N979 Female infertility, unspecified: Secondary | ICD-10-CM

## 2019-07-27 ENCOUNTER — Other Ambulatory Visit: Payer: Self-pay

## 2019-07-27 ENCOUNTER — Encounter: Payer: Self-pay | Admitting: Family Medicine

## 2019-07-27 ENCOUNTER — Ambulatory Visit (INDEPENDENT_AMBULATORY_CARE_PROVIDER_SITE_OTHER): Payer: Medicaid Other | Admitting: Family Medicine

## 2019-07-27 VITALS — BP 129/87 | HR 96 | Temp 97.1°F | Wt 275.2 lb

## 2019-07-27 DIAGNOSIS — E782 Mixed hyperlipidemia: Secondary | ICD-10-CM

## 2019-07-27 DIAGNOSIS — N979 Female infertility, unspecified: Secondary | ICD-10-CM

## 2019-07-27 DIAGNOSIS — Z0000 Encounter for general adult medical examination without abnormal findings: Secondary | ICD-10-CM

## 2019-07-27 DIAGNOSIS — R5383 Other fatigue: Secondary | ICD-10-CM | POA: Insufficient documentation

## 2019-07-27 DIAGNOSIS — R7303 Prediabetes: Secondary | ICD-10-CM | POA: Diagnosis not present

## 2019-07-27 DIAGNOSIS — Z1231 Encounter for screening mammogram for malignant neoplasm of breast: Secondary | ICD-10-CM

## 2019-07-27 MED ORDER — LETROZOLE 2.5 MG PO TABS: 2.5000 mg | ORAL_TABLET | Freq: Every day | ORAL | 2 refills | Status: DC

## 2019-07-27 NOTE — Assessment & Plan Note (Signed)
Ongoing for about 2 months She does not have much in the way of OSA symptoms She is a light sleeper and does have poor sleep due to her partner getting up before her Encouraged getting a split top mattress and trying a sound machine She can resume her B12 supplement as she feels like this helped her energy overall We did review her recent B12, vitamin D, TSH, and other labs that were unremarkable Follow-up as needed

## 2019-07-27 NOTE — Assessment & Plan Note (Signed)
Previously followed by GYN Encouraged continuing prenatal vitamin with folate Encouraged weight loss We will refill her letrozole to try for 3 months as was previously prescribed by GYN Encouraged low-carb diet to manage her impaired fasting glucose Encouraged her to track her ovulation Could consider referral to [facility] in the future, but unclear if Medicaid would cover this service

## 2019-07-27 NOTE — Assessment & Plan Note (Signed)
Previously elevated Not currently on a statin Discussed lifestyle interventions Recheck FLP and CMP

## 2019-07-27 NOTE — Patient Instructions (Signed)

## 2019-07-27 NOTE — Assessment & Plan Note (Signed)
Encourage low-carb diet Recheck hemoglobin A1c

## 2019-07-27 NOTE — Assessment & Plan Note (Signed)
Discussed importance of healthy weight management Discussed diet and exercise Discussed need to cut back on her sugary drink intake and drink more water Discussed intermittent fasting principles Could consider referral to healthy weight management clinic in the future

## 2019-07-27 NOTE — Progress Notes (Signed)
Patient: Kelsey Hughes, Female    DOB: 03/02/1981, 39 y.o.   MRN: [id number] Visit Date: 07/27/2019  Today's Provider: Lavon Paganini, MD   Chief Complaint  Patient presents with  . Annual Exam   Subjective:     Annual physical exam Kelsey Hughes is a 39 y.o. female who presents today for health maintenance and complete physical. She feels fairly well. She reports exercising as walking. She reports she is sleeping fairly well.  Feels very fatigued. Sleepy all the time ongoing for ~1-2 months. Non-restorative sleep.  Doesn't think that she snores.  Gets headaches often.  Will doze off on the highway a a passenger occasionally.  Fiance wakes early in the morning and it often wakes her up.  Has been off of B12 since labs were normal and that is when  She was previously seeing GYN for infertility.  She is not currently on any contraceptive.  She would like to conceive.  She knows that her chance of conceiving is very low given her age.  Her periods are mostly regular, with an occasional episode of breakthrough bleeding in the middle of the month.  She has been tracking her periods and likely fertile window with an app.  She has not been checking ovulation testing.  She did not take the letrozole as was prescribed by GYN.  She has not been evaluated by REI previously.  Her partner has not been evaluated by urology for sperm counts. -----------------------------------------------------------------   Review of Systems  Constitutional: Positive for activity change and fatigue.       Irritabilty  HENT: Negative.   Eyes: Negative.   Respiratory: Negative.   Cardiovascular: Negative.   Gastrointestinal: Negative.   Endocrine: Negative.   Genitourinary: Negative.   Musculoskeletal: Negative.   Skin: Negative.   Allergic/Immunologic: Negative.   Neurological: Positive for headaches.  Hematological: Negative.     Social History      She  reports that she has never smoked. She has  never used smokeless tobacco. She reports current alcohol use. She reports that she does not use drugs.       Social History   Socioeconomic History  . Marital status: Single    Spouse name: Not on file  . Number of children: Not on file  . Years of education: Not on file  . Highest education level: Not on file  Occupational History  . Occupation: Unemployed  Tobacco Use  . Smoking status: Never Smoker  . Smokeless tobacco: Never Used  Substance and Sexual Activity  . Alcohol use: Yes    Comment: occasional, not every week  . Drug use: No  . Sexual activity: Yes    Partners: Male    Birth control/protection: None  Other Topics Concern  . Not on file  Social History Narrative  . Not on file   Social Determinants of Health   Financial Resource Strain:   . Difficulty of Paying Living Expenses: Not on file  Food Insecurity:   . Worried About Charity fundraiser in the Last Year: Not on file  . Ran Out of Food in the Last Year: Not on file  Transportation Needs:   . Lack of Transportation (Medical): Not on file  . Lack of Transportation (Non-Medical): Not on file  Physical Activity:   . Days of Exercise per Week: Not on file  . Minutes of Exercise per Session: Not on file  Stress:   . Feeling of  Stress : Not on file  Social Connections:   . Frequency of Communication with Friends and Family: Not on file  . Frequency of Social Gatherings with Friends and Family: Not on file  . Attends Religious Services: Not on file  . Active Member of Clubs or Organizations: Not on file  . Attends Archivist Meetings: Not on file  . Marital Status: Not on file    Past Medical History:  Diagnosis Date  . Allergy   . Anemia   . Cutaneous skin tags   . Infertility, female      Patient Active Problem List   Diagnosis Date Noted  . B12 deficiency 02/09/2019  . Avitaminosis D 02/09/2019  . Infertility, female 02/09/2019  . Morbid obesity ([place]) 09/18/2013  . Prediabetes  09/18/2013    Past Surgical History:  Procedure Laterality Date  . ANTERIOR CRUCIATE LIGAMENT REPAIR    . DILATION AND CURETTAGE, DIAGNOSTIC / THERAPEUTIC      Family History        Family Status  Relation Name Status  . Father  Deceased  . Mother  Alive  . Mat Aunt  (Not Specified)  . Mat Uncle  (Not Specified)  . Neg Hx  (Not Specified)        Her family history includes Atrial fibrillation in her mother; Diabetes in her maternal aunt and maternal uncle; Heart disease in her father; Hypertension in her father and mother; Sleep apnea in her mother; Stroke in her father. There is no history of Colon cancer or Breast cancer.      Allergies  Allergen Reactions  . Sulfonamide Derivatives     REACTION: ??? reaction during childhood     Current Outpatient Medications:  .  cholecalciferol (VITAMIN D3) 25 MCG (1000 UT) tablet, Take 1,000 Units by mouth daily., Disp: , Rfl:  .  folic acid (FOLVITE) 1 MG tablet, Take 1 mg by mouth daily., Disp: , Rfl:  .  Multiple Vitamin (MULTIVITAMIN) tablet, Take 1 tablet by mouth daily., Disp: , Rfl:  .  Omega-3 Fatty Acids (FISH OIL PO), Take by mouth., Disp: , Rfl:  .  omeprazole (PRILOSEC) 20 MG capsule, Take 1 capsule (20 mg total) by mouth daily. PATIENT NEEDS OFFICE VISIT FOR ADDITIONAL REFILLS, Disp: 30 capsule, Rfl: 5 .  letrozole (FEMARA) 2.5 MG tablet, Take 1 tablet (2.5 mg total) by mouth daily. Take on days 3-7 after spontaneous menses, Disp: 5 tablet, Rfl: 2   Patient Care Team: Virginia Crews, MD as PCP - General (Family Medicine)    Objective:    Vitals: BP 129/87 (BP Location: Right Arm, Patient Position: Sitting, Cuff Size: Large)   Pulse 96   Temp (!) 97.1 F (36.2 C) (Temporal)   Wt 275 lb 3.2 oz (124.8 kg)   LMP 07/10/2019 (Exact Date)   BMI 45.80 kg/m    Vitals:   07/27/19 1521  BP: 129/87  Pulse: 96  Temp: (!) 97.1 F (36.2 C)  TempSrc: Temporal  Weight: 275 lb 3.2 oz (124.8 kg)     Physical  Exam Vitals reviewed.  Constitutional:      General: She is not in acute distress.    Appearance: Normal appearance. She is well-developed. She is not diaphoretic.  HENT:     Head: Normocephalic and atraumatic.     Right Ear: Tympanic membrane, ear canal and external ear normal.     Left Ear: Tympanic membrane, ear canal and external ear normal.  Nose: Nose normal.     Mouth/Throat:     Mouth: Mucous membranes are moist.     Pharynx: Oropharynx is clear. No oropharyngeal exudate.  Eyes:     General: No scleral icterus.    Conjunctiva/sclera: Conjunctivae normal.     Pupils: Pupils are equal, round, and reactive to light.  Neck:     Thyroid: No thyromegaly.  Cardiovascular:     Rate and Rhythm: Normal rate and regular rhythm.     Heart sounds: Normal heart sounds. No murmur.  Pulmonary:     Effort: Pulmonary effort is normal. No respiratory distress.     Breath sounds: Normal breath sounds. No wheezing or rales.  Abdominal:     General: There is no distension.     Palpations: Abdomen is soft.     Tenderness: There is no abdominal tenderness. There is no guarding or rebound.  Musculoskeletal:        General: No deformity.     Cervical back: Neck supple.     Right lower leg: No edema.     Left lower leg: No edema.  Lymphadenopathy:     Cervical: No cervical adenopathy.  Skin:    General: Skin is warm and dry.     Capillary Refill: Capillary refill takes less than 2 seconds.     Findings: No rash.  Neurological:     Mental Status: She is alert and oriented to person, place, and time. Mental status is at baseline.  Psychiatric:        Mood and Affect: Mood normal.        Behavior: Behavior normal.        Thought Content: Thought content normal.      Depression Screen PHQ 2/9 Scores 07/27/2019 02/09/2019 09/18/2013  PHQ - 2 Score 1 2 0  PHQ- 9 Score 3 5 -       Assessment & Plan:     Routine Health Maintenance and Physical Exam  Exercise Activities and Dietary  recommendations Goals   None     Immunization History  Administered Date(s) Administered  . Influenza,inj,Quad PF,6+ Mos 02/09/2019    Health Maintenance  Topic Date Due  . TETANUS/TDAP  06/29/1997  . PAP SMEAR-Modifier  11/17/2021  . INFLUENZA VACCINE  Completed  . HIV Screening  Completed     Discussed health benefits of physical activity, and encouraged her to engage in regular exercise appropriate for her age and condition.    --------------------------------------------------------------------  Problem List Items Addressed This Visit      Genitourinary   Infertility, female    Previously followed by GYN Encouraged continuing prenatal vitamin with folate Encouraged weight loss We will refill her letrozole to try for 3 months as was previously prescribed by GYN Encouraged low-carb diet to manage her impaired fasting glucose Encouraged her to track her ovulation Could consider referral to REI in the future, but unclear if Medicaid would cover this service        Other   Morbid obesity ([place])    Discussed importance of healthy weight management Discussed diet and exercise Discussed need to cut back on her sugary drink intake and drink more water Discussed intermittent fasting principles Could consider referral to healthy weight management clinic in the future      Relevant Orders   Hemoglobin A1c   Comprehensive metabolic panel   Lipid panel   Prediabetes    Encourage low-carb diet Recheck hemoglobin A1c      Relevant Orders  Hemoglobin A1c   Other fatigue    Ongoing for about 2 months She does not have much in the way of OSA symptoms She is a light sleeper and does have poor sleep due to her partner getting up before her Encouraged getting a split top mattress and trying a sound machine She can resume her B12 supplement as she feels like this helped her energy overall We did review her recent B12, vitamin D, TSH, and other labs that were  unremarkable Follow-up as needed      Mixed hyperlipidemia    Previously elevated Not currently on a statin Discussed lifestyle interventions Recheck FLP and CMP      Relevant Orders   Comprehensive metabolic panel   Lipid panel    Other Visit Diagnoses    Encounter for annual physical exam    -  Primary   Relevant Orders   Hemoglobin A1c   Comprehensive metabolic panel   Lipid panel   Encounter for screening mammogram for malignant neoplasm of breast       Relevant Orders   MM 3D SCREEN BREAST BILATERAL       Return in about 1 year (around 07/26/2020) for CPE.   The entirety of the information documented in the History of Present Illness, Review of Systems and Physical Exam were personally obtained by me. Portions of this information were initially documented by Ashley Royalty, CMA and reviewed by me for thoroughness and accuracy.    Walfred Bettendorf, Dionne Bucy, MD MPH [facility]

## 2019-07-31 DIAGNOSIS — E782 Mixed hyperlipidemia: Secondary | ICD-10-CM | POA: Diagnosis not present

## 2019-07-31 DIAGNOSIS — R7303 Prediabetes: Secondary | ICD-10-CM | POA: Diagnosis not present

## 2019-07-31 DIAGNOSIS — Z0000 Encounter for general adult medical examination without abnormal findings: Secondary | ICD-10-CM | POA: Diagnosis not present

## 2019-08-01 ENCOUNTER — Telehealth: Payer: Self-pay

## 2019-08-01 LAB — LIPID PANEL
Chol/HDL Ratio: 5.3 ratio — ABNORMAL HIGH (ref 0.0–4.4)
Cholesterol, Total: 232 mg/dL — ABNORMAL HIGH (ref 100–199)
HDL: 44 mg/dL (ref >39)
LDL Chol Calc (NIH): 165 mg/dL — ABNORMAL HIGH (ref 0–99)
Triglycerides: 128 mg/dL (ref 0–149)
VLDL Cholesterol Cal: 23 mg/dL (ref 5–40)

## 2019-08-01 LAB — COMPREHENSIVE METABOLIC PANEL
ALT: 21 IU/L (ref 0–32)
AST: 15 IU/L (ref 0–40)
Albumin/Globulin Ratio: 1.4 (ref 1.2–2.2)
Albumin: 4.1 g/dL (ref 3.8–4.8)
Alkaline Phosphatase: 78 IU/L (ref 39–117)
BUN/Creatinine Ratio: 11 (ref 9–23)
BUN: 8 mg/dL (ref 6–24)
Bilirubin Total: 0.3 mg/dL (ref 0.0–1.2)
CO2: 20 mmol/L (ref 20–29)
Calcium: 9.3 mg/dL (ref 8.7–10.2)
Chloride: 104 mmol/L (ref 96–106)
Creatinine, Ser: 0.72 mg/dL (ref 0.57–1.00)
GFR calc Af Amer: 120 mL/min/{1.73_m2} (ref >59)
GFR calc non Af Amer: 104 mL/min/{1.73_m2} (ref >59)
Globulin, Total: 3 g/dL (ref 1.5–4.5)
Glucose: 90 mg/dL (ref 65–99)
Potassium: 4.3 mmol/L (ref 3.5–5.2)
Sodium: 141 mmol/L (ref 134–144)
Total Protein: 7.1 g/dL (ref 6.0–8.5)

## 2019-08-01 LAB — HEMOGLOBIN A1C
Est. average glucose Bld gHb Est-mCnc: 117 mg/dL
Hgb A1c MFr Bld: 5.7 % — ABNORMAL HIGH (ref 4.8–5.6)

## 2019-08-01 NOTE — Telephone Encounter (Signed)
LMTCB.  PEC please advise pt of labs below.    Thanks,   -Mickel Baas

## 2019-08-01 NOTE — Telephone Encounter (Signed)
-----   Message from Virginia Crews, MD sent at 08/01/2019 12:19 PM EST ----- Stable A1c in prediabetic range. Normal kidney function, liver function, electrolytes.  Cholesterol is high, but would not start statin if considering pregnancy.  I recommend diet low in saturated fat and regular exercise - 30 min at least 5 times per week

## 2019-08-02 NOTE — Telephone Encounter (Signed)
Patient advised as below.  

## 2019-09-17 ENCOUNTER — Ambulatory Visit: Payer: Self-pay | Admitting: *Deleted

## 2019-09-17 NOTE — Telephone Encounter (Signed)
Patient calls with dizziness. Began yesterday evening she had 2 episodes, one while walking the other with sitting. Vertigo occurred once, the other was lightheadedness. Experienced the lightheadedness without vertigo this morning. No N/V. Denies CP/SOB/fever/weakness.No ear pain/sore throat. Headache yesterday none today. Has itchy eyes with occasional sneezing with allergies at this time.  Denies vision changes. Does not drink enough water. Encouraged at least 6 glasses water daily. Started her period approximatley 2 days ago seems to be much lighter bleeding than normal. B/P 158/100 HR 93 bpm today. Care advice including elevating her feet and legs for 15 minutes then rise slowly. Reviewed urgent symptoms requiring immediate evaluation if occurred: severe headache, blurred vision, CP/SOB, one-sided weakness or numbness or she begins to feel worse-seek treatment at the ED. Appointment made for 8:00am tomorrow with pcp.   Reason for Disposition . [1] MODERATE dizziness (e.g., vertigo; feels very unsteady, interferes with normal activities) AND [2] has NOT been evaluated by physician for this  Answer Assessment - Initial Assessment Questions 1. DESCRIPTION: "Describe your dizziness."     Room spiining 2. VERTIGO: "Do you feel like either you or the room is spinning or tilting?"      Yes,  3. LIGHTHEADED: "Do you feel lightheaded?" (e.g., somewhat faint, woozy, weak upon standing)     Occurred with walking and while sitting 4. SEVERITY: "How bad is it?"  "Can you walk?"   - MILD - Feels unsteady but walking normally.   - MODERATE - Feels very unsteady when walking, but not falling; interferes with normal activities (e.g., school, work) .   - SEVERE - Unable to walk without falling (requires assistance).     moderate 5. ONSET:  "When did the dizziness begin?"    Long time ago 6. AGGRAVATING FACTORS: "Does anything make it worse?" (e.g., standing, change in head position)    no 7. CAUSE: "What do  you think is causing the dizziness?"     unsure 8. RECURRENT SYMPTOM: "Have you had dizziness before?" If so, ask: "When was the last time?" "What happened that time?"     A long time ago 9. OTHER SYMPTOMS: "Do you have any other symptoms?" (e.g., headache, weakness, numbness, vomiting, earache)    Congestion runny nose, itchy eyes 10. PREGNANCY: "Is there any chance you are pregnant?" "When was your last menstrual period?"       3-4 days ago. 2 days ago before the dizziness began  Protocols used: DIZZINESS - VERTIGO-A-AH

## 2019-09-18 ENCOUNTER — Encounter: Payer: Self-pay | Admitting: Family Medicine

## 2019-09-18 ENCOUNTER — Ambulatory Visit (INDEPENDENT_AMBULATORY_CARE_PROVIDER_SITE_OTHER): Payer: Medicaid Other | Admitting: Family Medicine

## 2019-09-18 ENCOUNTER — Other Ambulatory Visit: Payer: Self-pay

## 2019-09-18 VITALS — BP 126/82 | HR 87 | Temp 97.7°F | Resp 16 | Ht 66.0 in | Wt 282.8 lb

## 2019-09-18 DIAGNOSIS — H6982 Other specified disorders of Eustachian tube, left ear: Secondary | ICD-10-CM

## 2019-09-18 DIAGNOSIS — H8110 Benign paroxysmal vertigo, unspecified ear: Secondary | ICD-10-CM

## 2019-09-18 MED ORDER — FLUTICASONE PROPIONATE 50 MCG/ACT NA SUSP: 2.0000 | Freq: Every day | NASAL | 6 refills | Status: DC

## 2019-09-18 MED ORDER — MECLIZINE HCL 25 MG PO TABS: 25.0000 mg | ORAL_TABLET | Freq: Three times a day (TID) | ORAL | 0 refills | Status: DC | PRN

## 2019-09-18 NOTE — Patient Instructions (Signed)

## 2019-09-18 NOTE — Progress Notes (Signed)
Patient: Kelsey Hughes Female    DOB: [birth year]   39 y.o.   MRN: [id number] Visit Date: 09/18/2019  Today's Provider: Lavon Paganini, MD   Chief Complaint  Patient presents with  . Dizziness   Subjective:     HPI Patient here today C/O two episodes dizziness, one while walking and the other with sitting.  Started 2 days ago. Patient denies nausea, vomiting, chest pain, shortness of breath, fever, or weakness. Patient reports headache two days ago, denies any visual changes. Patient reports dizziness is while sitting, and being active.  First episode occurred with turning her head quickly to one side.  Next episode was after moving quickly.   Allergies  Allergen Reactions  . Sulfonamide Derivatives     REACTION: ??? reaction during childhood     Current Outpatient Medications:  .  cholecalciferol (VITAMIN D3) 25 MCG (1000 UT) tablet, Take 1,000 Units by mouth daily., Disp: , Rfl:  .  folic acid (FOLVITE) 1 MG tablet, Take 1 mg by mouth daily., Disp: , Rfl:  .  letrozole (FEMARA) 2.5 MG tablet, Take 1 tablet (2.5 mg total) by mouth daily. Take on days 3-7 after spontaneous menses, Disp: 5 tablet, Rfl: 2 .  Multiple Vitamin (MULTIVITAMIN) tablet, Take 1 tablet by mouth daily., Disp: , Rfl:  .  Omega-3 Fatty Acids (FISH OIL PO), Take by mouth., Disp: , Rfl:  .  omeprazole (PRILOSEC) 20 MG capsule, Take 1 capsule (20 mg total) by mouth daily. PATIENT NEEDS OFFICE VISIT FOR ADDITIONAL REFILLS (Patient taking differently: Take 20 mg by mouth daily. ), Disp: 30 capsule, Rfl: 5  Review of Systems  Social History   Tobacco Use  . Smoking status: Never Smoker  . Smokeless tobacco: Never Used  Substance Use Topics  . Alcohol use: Yes    Comment: occasional, not every week      Objective:   BP 126/82 (BP Location: Left Arm, Patient Position: Sitting, Cuff Size: Large)   Pulse 87   Temp 97.7 F (36.5 C) (Temporal)   Resp 16   Ht 5\' 6"  (1.676 m)   Wt 282 lb 12.8 oz  (128.3 kg)   LMP 09/16/2019 (Exact Date)   BMI 45.65 kg/m  Vitals:   09/18/19 0812  BP: 126/82  Pulse: 87  Resp: 16  Temp: 97.7 F (36.5 C)  TempSrc: Temporal  Weight: 282 lb 12.8 oz (128.3 kg)  Height: 5\' 6"  (1.676 m)  Body mass index is 45.65 kg/m.   Physical Exam Vitals reviewed.  Constitutional:      General: She is not in acute distress.    Appearance: Normal appearance. She is well-developed. She is not diaphoretic.  HENT:     Head: Normocephalic and atraumatic.     Right Ear: Tympanic membrane, ear canal and external ear normal.     Left Ear: Tympanic membrane, ear canal and external ear normal.  Eyes:     General: No scleral icterus.    Extraocular Movements: Extraocular movements intact.     Conjunctiva/sclera: Conjunctivae normal.     Pupils: Pupils are equal, round, and reactive to light.     Comments: Some Nystagmus with Marye Round to the L side  Neck:     Thyroid: No thyromegaly.  Cardiovascular:     Rate and Rhythm: Normal rate and regular rhythm.     Heart sounds: Normal heart sounds. No murmur.  Pulmonary:     Effort: Pulmonary effort is normal.  No respiratory distress.     Breath sounds: Normal breath sounds. No wheezing, rhonchi or rales.  Musculoskeletal:     Cervical back: Neck supple.     Right lower leg: No edema.     Left lower leg: No edema.  Lymphadenopathy:     Cervical: No cervical adenopathy.  Skin:    General: Skin is warm and dry.     Findings: No rash.  Neurological:     Mental Status: She is alert and oriented to person, place, and time. Mental status is at baseline.  Psychiatric:        Mood and Affect: Mood normal.        Behavior: Behavior normal.      No results found for any visits on 09/18/19.     Assessment & Plan    1. Benign paroxysmal positional vertigo, unspecified laterality - new problem - history and exam c/w BPPV - Neuro intact, no cardiac symptoms - likely related to allergic rhinitis and eustachian  tube dysfunction -Discussed Epley maneuver -Flonase as below -Meclizine as needed -Slow and cautious movements -Return precautions discussed  2. Dysfunction of left eustachian tube -New problem -Likely related to underlying allergic rhinitis and congestion -Start Flonase daily for at least the next 2 weeks   Meds ordered this encounter  Medications  . meclizine (ANTIVERT) 25 MG tablet    Sig: Take 1 tablet (25 mg total) by mouth 3 (three) times daily as needed for dizziness.    Dispense:  30 tablet    Refill:  0  . fluticasone (FLONASE) 50 MCG/ACT nasal spray    Sig: Place 2 sprays into both nostrils daily.    Dispense:  16 g    Refill:  6     Return if symptoms worsen or fail to improve.   The entirety of the information documented in the History of Present Illness, Review of Systems and Physical Exam were personally obtained by me. Portions of this information were initially documented by Lynford Humphrey, CMA and reviewed by me for thoroughness and accuracy.    Jeevan Kalla, Dionne Bucy, MD MPH [facility]

## 2019-09-19 ENCOUNTER — Other Ambulatory Visit: Payer: Self-pay | Admitting: Family Medicine

## 2019-10-25 ENCOUNTER — Ambulatory Visit: Payer: Medicaid Other | Attending: Internal Medicine

## 2019-10-25 DIAGNOSIS — Z23 Encounter for immunization: Secondary | ICD-10-CM

## 2019-10-25 NOTE — Progress Notes (Signed)
   Covid-19 Vaccination Clinic  Name:  MAIARA GRANER    MRN: [id number] DOB: April 30, 1981  10/25/2019  Ms. Guymon was observed post Covid-19 immunization for 15 minutes without incident. She was provided with Vaccine Information Sheet and instruction to access the V-Safe system.   Ms. Wiggers was instructed to call 911 with any severe reactions post vaccine: Marland Kitchen Difficulty breathing  . Swelling of face and throat  . A fast heartbeat  . A bad rash all over body  . Dizziness and weakness   Immunizations Administered    Name Date Dose VIS Date Route   Pfizer COVID-19 Vaccine 10/25/2019 11:17 AM 0.3 mL 08/15/2018 Intramuscular   Manufacturer: [place]   Lot: [id number]   [place]: [id number]

## 2019-11-20 ENCOUNTER — Ambulatory Visit: Payer: Medicaid Other | Attending: Internal Medicine

## 2019-11-20 DIAGNOSIS — Z23 Encounter for immunization: Secondary | ICD-10-CM

## 2019-11-20 NOTE — Progress Notes (Signed)
° °  Covid-19 Vaccination Clinic  Name:  Kelsey Hughes    MRN: [id number] DOB: 06-Feb-1981  11/20/2019  [name] was observed post Covid-19 immunization for 15 minutes without incident. She was provided with Vaccine Information Sheet and instruction to access the V-Safe system.   [name] was instructed to call 911 with any severe reactions post vaccine:  Difficulty breathing   Swelling of face and throat   A fast heartbeat   A bad rash all over body   Dizziness and weakness   Immunizations Administered    Name Date Dose VIS Date Route   Pfizer COVID-19 Vaccine 11/20/2019 11:13 AM 0.3 mL 08/15/2018 Intramuscular   Manufacturer: [place]   Lot: [id number]   [place]: [id number]

## 2019-12-12 ENCOUNTER — Other Ambulatory Visit: Payer: Self-pay

## 2019-12-12 ENCOUNTER — Encounter: Payer: Self-pay | Admitting: Family Medicine

## 2019-12-12 ENCOUNTER — Ambulatory Visit (INDEPENDENT_AMBULATORY_CARE_PROVIDER_SITE_OTHER): Payer: Medicaid Other | Admitting: Family Medicine

## 2019-12-12 VITALS — BP 130/90 | HR 90 | Temp 97.1°F | Resp 16 | Ht 65.0 in | Wt 283.4 lb

## 2019-12-12 DIAGNOSIS — N926 Irregular menstruation, unspecified: Secondary | ICD-10-CM

## 2019-12-12 DIAGNOSIS — B372 Candidiasis of skin and nail: Secondary | ICD-10-CM | POA: Diagnosis not present

## 2019-12-12 MED ORDER — CLOTRIMAZOLE-BETAMETHASONE 1-0.05 % EX CREA: 1.0000 "application " | TOPICAL_CREAM | Freq: Two times a day (BID) | CUTANEOUS | 1 refills | Status: DC

## 2019-12-12 NOTE — Assessment & Plan Note (Signed)
Discussed importance of healthy weight management Discussed diet and exercise  

## 2019-12-12 NOTE — Patient Instructions (Signed)

## 2019-12-12 NOTE — Progress Notes (Signed)
Established patient visit   Patient: Kelsey Hughes   DOB: January 22, 1981   39 y.o. Female  MRN: [id number] Visit Date: 12/12/2019  I,Sulibeya S Dimas,acting as a scribe for Lavon Paganini, MD.,have documented all relevant documentation on the behalf of Lavon Paganini, MD,as directed by  Lavon Paganini, MD while in the presence of Lavon Paganini, MD.  Today's healthcare provider: Lavon Paganini, MD   Chief Complaint  Patient presents with  . Rash   Subjective    HPI Patient here today C/O persistent rash on legs.  Rash in her groin has worsened since the warm weather.  She reports is worse when sweating and hot.  It is very itchy and sometimes tender.  She has not tried any creams on this.  She previously tried powders which did not seem to help.  No fevers, lymphadenopathy, abdominal pain, vaginal discharge.  She also reports irregular bleeding.  She has tried letrozole for ovulation induction as she would like to have another child.  She is not currently on any contraceptive.  She is sexually active with 1 female partner.  Patient Active Problem List   Diagnosis Date Noted  . Other fatigue 07/27/2019  . Mixed hyperlipidemia 07/27/2019  . B12 deficiency 02/09/2019  . Avitaminosis D 02/09/2019  . Infertility, female 02/09/2019  . Morbid obesity ([place]) 09/18/2013  . Prediabetes 09/18/2013   Social History   Tobacco Use  . Smoking status: Never Smoker  . Smokeless tobacco: Never Used  Vaping Use  . Vaping Use: Never used  Substance Use Topics  . Alcohol use: Yes    Comment: occasional, not every week  . Drug use: No       Medications: Outpatient Medications Prior to Visit  Medication Sig  . cholecalciferol (VITAMIN D3) 25 MCG (1000 UT) tablet Take 1,000 Units by mouth daily.  . fluticasone (FLONASE) 50 MCG/ACT nasal spray Place 2 sprays into both nostrils daily.  . folic acid (FOLVITE) 1 MG tablet Take 1 mg by mouth daily.  Marland Kitchen letrozole (FEMARA) 2.5 MG  tablet Take 1 tablet (2.5 mg total) by mouth daily. Take on days 3-7 after spontaneous menses  . meclizine (ANTIVERT) 25 MG tablet Take 1 tablet (25 mg total) by mouth 3 (three) times daily as needed for dizziness.  . Multiple Vitamin (MULTIVITAMIN) tablet Take 1 tablet by mouth daily.  . Omega-3 Fatty Acids (FISH OIL PO) Take by mouth.  Marland Kitchen omeprazole (PRILOSEC) 20 MG capsule Take 1 capsule (20 mg total) by mouth daily.   No facility-administered medications prior to visit.    Review of Systems    Objective    BP 130/90 (BP Location: Left Arm, Patient Position: Sitting, Cuff Size: Large)   Pulse 90   Temp (!) 97.1 F (36.2 C) (Temporal)   Resp 16   Ht 5\' 5"  (1.651 m)   Wt 283 lb 6.4 oz (128.5 kg)   BMI 47.16 kg/m  BP Readings from Last 3 Encounters:  12/12/19 130/90  09/18/19 126/82  07/27/19 129/87   Wt Readings from Last 3 Encounters:  12/12/19 283 lb 6.4 oz (128.5 kg)  09/18/19 282 lb 12.8 oz (128.3 kg)  07/27/19 275 lb 3.2 oz (124.8 kg)      Physical Exam Vitals reviewed.  Constitutional:      General: She is not in acute distress.    Appearance: She is well-developed.  HENT:     Head: Normocephalic and atraumatic.  Eyes:     General: No  scleral icterus.    Conjunctiva/sclera: Conjunctivae normal.  Cardiovascular:     Rate and Rhythm: Normal rate and regular rhythm.  Pulmonary:     Effort: Pulmonary effort is normal. No respiratory distress.  Skin:    General: Skin is warm and dry.     Findings: Rash present.  Neurological:     Mental Status: She is alert and oriented to person, place, and time.  Psychiatric:        Behavior: Behavior normal.      No results found for any visits on 12/12/19.  Assessment & Plan     1. Candidal intertrigo -Recurrent problem, but new to me -Rash in her groin is consistent with intertrigo -Rash on her legs may be satellite lesions -Start Lotrisone ointment twice daily -Advised on wicking clothing and keeping the area  clean and dry -Return precautions discussed  2. Irregular menses -Recurrent problem -No longer seeing GYN -Discussed that typically contraceptives are used to regulate periods, but this is difficult given her desire for conception -Advised that PCOS may be a problem and she could consider seeing GYN again -We could also get PCOS labs at some point -She will consider what her goals are regarding pregnancy and we will follow-up at that point  3. Morbid obesity ([place]) Discussed importance of healthy weight management Discussed diet and exercise   Return if symptoms worsen or fail to improve.      I, Lavon Paganini, MD, have reviewed all documentation for this visit. The documentation on 12/13/19 for the exam, diagnosis, procedures, and orders are all accurate and complete.   Kaydin Labo, Dionne Bucy, MD, MPH [facility]

## 2019-12-13 ENCOUNTER — Encounter: Payer: Self-pay | Admitting: Family Medicine

## 2020-02-29 ENCOUNTER — Ambulatory Visit (INDEPENDENT_AMBULATORY_CARE_PROVIDER_SITE_OTHER): Payer: Medicaid Other | Admitting: Family Medicine

## 2020-02-29 ENCOUNTER — Other Ambulatory Visit: Payer: Self-pay

## 2020-02-29 ENCOUNTER — Encounter: Payer: Self-pay | Admitting: Family Medicine

## 2020-02-29 VITALS — BP 135/81 | HR 87 | Temp 98.5°F | Resp 16 | Ht 66.0 in | Wt 289.0 lb

## 2020-02-29 DIAGNOSIS — R590 Localized enlarged lymph nodes: Secondary | ICD-10-CM | POA: Diagnosis not present

## 2020-02-29 DIAGNOSIS — H66002 Acute suppurative otitis media without spontaneous rupture of ear drum, left ear: Secondary | ICD-10-CM | POA: Diagnosis not present

## 2020-02-29 MED ORDER — AMOXICILLIN-POT CLAVULANATE 875-125 MG PO TABS: 1.0000 | ORAL_TABLET | Freq: Two times a day (BID) | ORAL | 0 refills | Status: AC

## 2020-02-29 NOTE — Progress Notes (Signed)
Established patient visit  I,Sulibeya S Dimas,acting as a scribe for Lavon Paganini, MD.,have documented all relevant documentation on the behalf of Lavon Paganini, MD,as directed by  Lavon Paganini, MD while in the presence of Lavon Paganini, MD.   Patient: Kelsey Hughes   DOB: [birth year]   39 y.o. Female  MRN: [id number] Visit Date: 02/29/2020  Today's healthcare provider: Lavon Paganini, MD   Chief Complaint  Patient presents with  . Otalgia   Subjective    Otalgia  There is pain in the left ear. This is a new problem. Episode onset: 4 days. The problem occurs constantly. The problem has been unchanged. There has been no fever. The pain is moderate. Pertinent negatives include no coughing (dry), ear discharge, hearing loss, rhinorrhea or sore throat. She has tried acetaminophen for the symptoms. The treatment provided moderate relief. There is no history of a chronic ear infection.      Patient Active Problem List   Diagnosis Date Noted  . Other fatigue 07/27/2019  . Mixed hyperlipidemia 07/27/2019  . B12 deficiency 02/09/2019  . Avitaminosis D 02/09/2019  . Infertility, female 02/09/2019  . Morbid obesity ([place]) 09/18/2013  . Prediabetes 09/18/2013   Social History   Tobacco Use  . Smoking status: Never Smoker  . Smokeless tobacco: Never Used  Vaping Use  . Vaping Use: Never used  Substance Use Topics  . Alcohol use: Yes    Comment: occasional, not every week  . Drug use: No   Allergies  Allergen Reactions  . Sulfonamide Derivatives     REACTION: ??? reaction during childhood       Medications: Outpatient Medications Prior to Visit  Medication Sig  . cholecalciferol (VITAMIN D3) 25 MCG (1000 UT) tablet Take 1,000 Units by mouth daily.  . clotrimazole-betamethasone (LOTRISONE) cream Apply 1 application topically 2 (two) times daily.  . fluticasone (FLONASE) 50 MCG/ACT nasal spray Place 2 sprays into both nostrils daily.  . folic acid  (FOLVITE) 1 MG tablet Take 1 mg by mouth daily.  . Multiple Vitamin (MULTIVITAMIN) tablet Take 1 tablet by mouth daily.  . Omega-3 Fatty Acids (FISH OIL PO) Take by mouth.  Marland Kitchen omeprazole (PRILOSEC) 20 MG capsule Take 1 capsule (20 mg total) by mouth daily.  Marland Kitchen letrozole (FEMARA) 2.5 MG tablet Take 1 tablet (2.5 mg total) by mouth daily. Take on days 3-7 after spontaneous menses (Patient not taking: Reported on 02/29/2020)  . meclizine (ANTIVERT) 25 MG tablet Take 1 tablet (25 mg total) by mouth 3 (three) times daily as needed for dizziness. (Patient not taking: Reported on 02/29/2020)   No facility-administered medications prior to visit.    Review of Systems  Constitutional: Negative for chills and fever.  HENT: Positive for ear pain. Negative for ear discharge, hearing loss, rhinorrhea and sore throat.   Respiratory: Negative for cough (dry), shortness of breath and wheezing.   Cardiovascular: Negative.   Neurological: Negative.     Last CBC Lab Results  Component Value Date   WBC 8.2 08/28/2018   HGB 13.5 08/28/2018   HCT 41.4 08/28/2018   MCV 94.3 08/28/2018   MCH 30.8 08/28/2018   RDW 13.7 08/28/2018   PLT 289 08/28/2018      Objective    BP 135/81 (BP Location: Left Arm, Patient Position: Sitting, Cuff Size: Large)   Pulse 87   Temp 98.5 F (36.9 C) (Oral)   Resp 16   Ht 5\' 6"  (1.676 m)   Wt 289  lb (131.1 kg)   BMI 46.65 kg/m  BP Readings from Last 3 Encounters:  02/29/20 135/81  12/12/19 130/90  09/18/19 126/82   Wt Readings from Last 3 Encounters:  02/29/20 289 lb (131.1 kg)  12/12/19 283 lb 6.4 oz (128.5 kg)  09/18/19 282 lb 12.8 oz (128.3 kg)      Physical Exam Vitals reviewed.  Constitutional:      General: She is not in acute distress.    Appearance: Normal appearance. She is not diaphoretic.  HENT:     Head: Normocephalic and atraumatic.     Right Ear: Tympanic membrane, ear canal and external ear normal. There is no impacted cerumen.     Left  Ear: Ear canal and external ear normal. There is no impacted cerumen. Tympanic membrane is erythematous and bulging. Tympanic membrane is not perforated.  Lymphadenopathy:     Head:     Left side of head: Submandibular, preauricular and posterior auricular adenopathy present.  Neurological:     Mental Status: She is alert.      No results found for any visits on 02/29/20.  Assessment & Plan     1. Non-recurrent acute suppurative otitis media of left ear without spontaneous rupture of tympanic membrane 2. Periauricular lymphadenopathy - new problem - symptoms and exam c/w L AOM without rupture - no evidence of sinusitis, CAP, strep pharyngitis, or other infection - no other symptoms suspicious for COVID19 infection - will treat with Augmentin x7d - discussed symptomatic management (flonase, tylenol, etc), natural course, and return precautions       Return if symptoms worsen or fail to improve.      I, Lavon Paganini, MD, have reviewed all documentation for this visit. The documentation on 02/29/20 for the exam, diagnosis, procedures, and orders are all accurate and complete.   Nemiah Bubar, Dionne Bucy, MD, MPH [facility]

## 2020-02-29 NOTE — Patient Instructions (Signed)

## 2020-02-29 NOTE — Progress Notes (Deleted)
    I,Treyvin Glidden,acting as a scribe for Lavon Paganini, MD.,have documented all relevant documentation on the behalf of Lavon Paganini, MD,as directed by  Lavon Paganini, MD while in the presence of Lavon Paganini, MD.  Virtual telephone visit    Virtual Visit via Telephone Note   This visit type was conducted due to national recommendations for restrictions regarding the COVID-19 Pandemic (e.g. social distancing) in an effort to limit this patient's exposure and mitigate transmission in our community. Due to her co-morbid illnesses, this patient is at least at moderate risk for complications without adequate follow up. This format is felt to be most appropriate for this patient at this time. The patient did not have access to video technology or had technical difficulties with video requiring transitioning to audio format only (telephone). Physical exam was limited to content and character of the telephone converstion.    Patient location: *** Provider location: ***   Visit Date: 02/29/2020  Today's healthcare provider: Lavon Paganini, MD   No chief complaint on file.  Subjective    HPI  ***    {Show patient history (optional):23778::" "}  Medications: Outpatient Medications Prior to Visit  Medication Sig  . cholecalciferol (VITAMIN D3) 25 MCG (1000 UT) tablet Take 1,000 Units by mouth daily.  . clotrimazole-betamethasone (LOTRISONE) cream Apply 1 application topically 2 (two) times daily.  . fluticasone (FLONASE) 50 MCG/ACT nasal spray Place 2 sprays into both nostrils daily.  . folic acid (FOLVITE) 1 MG tablet Take 1 mg by mouth daily.  . Multiple Vitamin (MULTIVITAMIN) tablet Take 1 tablet by mouth daily.  . Omega-3 Fatty Acids (FISH OIL PO) Take by mouth.  Marland Kitchen omeprazole (PRILOSEC) 20 MG capsule Take 1 capsule (20 mg total) by mouth daily.  Marland Kitchen letrozole (FEMARA) 2.5 MG tablet Take 1 tablet (2.5 mg total) by mouth daily. Take on days 3-7 after spontaneous  menses (Patient not taking: Reported on 02/29/2020)  . meclizine (ANTIVERT) 25 MG tablet Take 1 tablet (25 mg total) by mouth 3 (three) times daily as needed for dizziness. (Patient not taking: Reported on 02/29/2020)   No facility-administered medications prior to visit.    Review of Systems  {Heme  Chem  Endocrine  Serology  Results Review (optional):23779::" "}  Objective    There were no vitals taken for this visit. {Show previous vital signs (optional):23777::" "}    Assessment & Plan     ***  No follow-ups on file.    I discussed the assessment and treatment plan with the patient. The patient was provided an opportunity to ask questions and all were answered. The patient agreed with the plan and demonstrated an understanding of the instructions.   The patient was advised to call back or seek an in-person evaluation if the symptoms worsen or if the condition fails to improve as anticipated.  I provided *** minutes of non-face-to-face time during this encounter.  {provider attestation***:1}  Lavon Paganini, MD [facility] [phone] (phone) [phone] (fax)  [place]

## 2020-03-10 ENCOUNTER — Other Ambulatory Visit: Payer: Self-pay

## 2020-03-10 ENCOUNTER — Ambulatory Visit (INDEPENDENT_AMBULATORY_CARE_PROVIDER_SITE_OTHER): Payer: Medicaid Other | Admitting: Family Medicine

## 2020-03-10 DIAGNOSIS — Z23 Encounter for immunization: Secondary | ICD-10-CM | POA: Diagnosis not present

## 2020-03-11 DIAGNOSIS — H5213 Myopia, bilateral: Secondary | ICD-10-CM | POA: Diagnosis not present

## 2020-06-26 DIAGNOSIS — Z20822 Contact with and (suspected) exposure to covid-19: Secondary | ICD-10-CM | POA: Diagnosis not present

## 2020-07-14 ENCOUNTER — Telehealth: Payer: Self-pay

## 2020-07-14 NOTE — Telephone Encounter (Signed)
Copied from [place] [id number]. Topic: General - Other >> Jul 11, 2020  2:52 PM Keene Breath wrote: Reason for CRM: Patient called to ask if the nurse or doctor could send in a script for a sinus infection.  Informed patients that appts. Are virtual today and there were none available today.  Please call patient to discuss at [phone]

## 2020-07-15 NOTE — Telephone Encounter (Signed)
LMTCB, PEC please schedule virtual visit.

## 2020-07-22 ENCOUNTER — Telehealth (INDEPENDENT_AMBULATORY_CARE_PROVIDER_SITE_OTHER): Payer: Medicaid Other | Admitting: Physician Assistant

## 2020-07-22 DIAGNOSIS — J069 Acute upper respiratory infection, unspecified: Secondary | ICD-10-CM | POA: Diagnosis not present

## 2020-07-22 MED ORDER — PROMETHAZINE-DM 6.25-15 MG/5ML PO SYRP: 5.0000 mL | ORAL_SOLUTION | Freq: Every evening | ORAL | 0 refills | Status: DC | PRN

## 2020-07-22 NOTE — Progress Notes (Signed)
MyChart Video Visit    Virtual Visit via Video Note   This visit type was conducted due to national recommendations for restrictions regarding the COVID-19 Pandemic (e.g. social distancing) in an effort to limit this patient's exposure and mitigate transmission in our community. This patient is at least at moderate risk for complications without adequate follow up. This format is felt to be most appropriate for this patient at this time. Physical exam was limited by quality of the video and audio technology used for the visit.   Patient location: Home Provider location: Office   I discussed the limitations of evaluation and management by telemedicine and the availability of in person appointments. The patient expressed understanding and agreed to proceed.  Patient: Kelsey Hughes   DOB: Apr 12, 1981   40 y.o. Female  MRN: [id number] Visit Date: 07/22/2020  Today's healthcare provider: Trinna Post, PA-C   Chief Complaint  Patient presents with  . URI  I,Porsha C McClurkin,acting as a scribe for [employer], PA-C.,have documented all relevant documentation on the behalf of Trinna Post, PA-C,as directed by  Trinna Post, PA-C while in the presence of Trinna Post, PA-C.  Subjective    URI  This is a new problem. The current episode started in the past 7 days. The problem has been gradually worsening. There has been no fever. Associated symptoms include congestion, coughing, ear pain, headaches, rhinorrhea, sinus pain and sneezing. Pertinent negatives include no diarrhea, nausea, sore throat, vomiting or wheezing. She has tried decongestant, antihistamine and increased fluids for the symptoms. The treatment provided no relief.    Reports she has URI symptoms X 4 days. She has some chest tightness after coughing. Worsening over the weekend. She has not been tested for COVID since start of symptoms.      Medications: Outpatient Medications Prior to Visit  Medication  Sig  . cholecalciferol (VITAMIN D3) 25 MCG (1000 UT) tablet Take 1,000 Units by mouth daily.  . clotrimazole-betamethasone (LOTRISONE) cream Apply 1 application topically 2 (two) times daily.  . fluticasone (FLONASE) 50 MCG/ACT nasal spray Place 2 sprays into both nostrils daily.  . folic acid (FOLVITE) 1 MG tablet Take 1 mg by mouth daily.  . Multiple Vitamin (MULTIVITAMIN) tablet Take 1 tablet by mouth daily.  . Omega-3 Fatty Acids (FISH OIL PO) Take by mouth.  Marland Kitchen omeprazole (PRILOSEC) 20 MG capsule Take 1 capsule (20 mg total) by mouth daily.  Marland Kitchen letrozole (FEMARA) 2.5 MG tablet Take 1 tablet (2.5 mg total) by mouth daily. Take on days 3-7 after spontaneous menses (Patient not taking: No sig reported)  . meclizine (ANTIVERT) 25 MG tablet Take 1 tablet (25 mg total) by mouth 3 (three) times daily as needed for dizziness. (Patient not taking: No sig reported)   No facility-administered medications prior to visit.    Review of Systems  Constitutional: Negative for appetite change, chills, fatigue and fever.  HENT: Positive for congestion, ear pain, postnasal drip, rhinorrhea, sinus pressure, sinus pain and sneezing. Negative for sore throat.   Respiratory: Positive for cough. Negative for chest tightness, shortness of breath and wheezing.   Gastrointestinal: Negative for diarrhea, nausea and vomiting.  Neurological: Positive for headaches. Negative for weakness.      Objective    There were no vitals taken for this visit.   Physical Exam Constitutional:      Appearance: Normal appearance.  Pulmonary:     Effort: Pulmonary effort is normal. No respiratory distress.  Neurological:     Mental Status: She is alert and oriented to person, place, and time. Mental status is at baseline.  Psychiatric:        Mood and Affect: Mood normal.        Behavior: Behavior normal.        Assessment & Plan    1. Upper respiratory tract infection, unspecified type  - symptoms c/w viral URI   - no evidence of strep pharyngitis, CAP, AOM, bacterial sinusitis, or other bacterial infection - concern for possible COVID19 infection - will send for outpatient testing - discussed need to quarantine 10 days from start of symptoms (or possibly 5 days with new CDC recommendations) and until fever-free for at least 24 hours - discussed symptomatic management, natural course, and return precautions    - COVID-19, Flu A+B and RSV - promethazine-dextromethorphan (PROMETHAZINE-DM) 6.25-15 MG/5ML syrup; Take 5 mLs by mouth at bedtime as needed.  Dispense: 118 mL; Refill: 0   No follow-ups on file.     I discussed the assessment and treatment plan with the patient. The patient was provided an opportunity to ask questions and all were answered. The patient agreed with the plan and demonstrated an understanding of the instructions.   The patient was advised to call back or seek an in-person evaluation if the symptoms worsen or if the condition fails to improve as anticipated.   ITrinna Post, PA-C, have reviewed all documentation for this visit. The documentation on 07/22/20 for the exam, diagnosis, procedures, and orders are all accurate and complete.  The entirety of the information documented in the History of Present Illness, Review of Systems and Physical Exam were personally obtained by me. Portions of this information were initially documented by [facility] and reviewed by me for thoroughness and accuracy.    Paulene Floor [facility] [phone] (phone) [phone] (fax)  [place]

## 2020-07-23 DIAGNOSIS — J069 Acute upper respiratory infection, unspecified: Secondary | ICD-10-CM | POA: Diagnosis not present

## 2020-07-23 NOTE — Patient Instructions (Signed)
COVID-19 Quarantine vs. Isolation QUARANTINE keeps someone who was in close contact with someone who has COVID-19 away from others. Quarantine if you have been in close contact with someone who has COVID-19, unless you have been fully vaccinated. If you are fully vaccinated  You do NOT need to quarantine unless they have symptoms  Get tested 3-5 days after your exposure, even if you don't have symptoms  Wear a mask indoors in public for 14 days following exposure or until your test result is negative If you are not fully vaccinated  Stay home for 14 days after your last contact with a person who has COVID-19  Watch for fever (100.4F), cough, shortness of breath, or other symptoms of COVID-19  If possible, stay away from people you live with, especially people who are at higher risk for getting very sick from COVID-19  Contact your local public health department for options in your area to possibly shorten your quarantine ISOLATION keeps someone who is sick or tested positive for COVID-19 without symptoms away from others, even in their own home. People who are in isolation should stay home and stay in a specific "sick room" or area and use a separate bathroom (if available). If you are sick and think or know you have COVID-19 Stay home until after  At least 10 days since symptoms first appeared and  At least 24 hours with no fever without the use of fever-reducing medications and  Symptoms have improved If you tested positive for COVID-19 but do not have symptoms  Stay home until after 10 days have passed since your positive viral test  If you develop symptoms after testing positive, follow the steps above for those who are sick cdc.gov/coronavirus 03/17/2020 This information is not intended to replace advice given to you by your health care provider. Make sure you discuss any questions you have with your health care provider. Document Revised: 04/21/2020 Document Reviewed:  04/21/2020 Elsevier Patient Education  2021 Elsevier Inc.  

## 2020-07-24 LAB — COVID-19, FLU A+B AND RSV
Influenza A, NAA: NOT DETECTED
Influenza B, NAA: NOT DETECTED
RSV, NAA: NOT DETECTED
SARS-CoV-2, NAA: NOT DETECTED

## 2020-07-24 LAB — SPECIMEN STATUS REPORT

## 2020-07-30 ENCOUNTER — Ambulatory Visit: Payer: Self-pay | Admitting: *Deleted

## 2020-07-30 NOTE — Telephone Encounter (Signed)
C/o increased wheezing from 07/22/20 virtual visit with [place], [place]. Patient reports she is negative for covid and continues to have pressure in her nose, runny nose, ear pain, cough chest pain when walking and wheezing starts after walking. Headaches come and go. Denies fever, dizziness, difficulty breathing. Coughing now is productive and difficulty to cough up sputum. Color of sputum is green. Earliest My Chart visit with PCP is on 08/04/20. Patient would like to know if PCP can recommend or give additional medication to treat symptoms due to cough syrup given is not relieving cough. Patient would like to know if she will need another appt. Care advise given. Patient verbalized understanding of care advise and to call back or go to ED if symptoms worsen.  Reason for Disposition . [1] MILD difficulty breathing (e.g., minimal/no SOB at rest, SOB with walking, pulse <100) AND [2] NEW-onset or WORSE than normal  Answer Assessment - Initial Assessment Questions 1. RESPIRATORY STATUS: "Describe your breathing?" (e.g., wheezing, shortness of breath, unable to speak, severe coughing)      wheezing 2. ONSET: "When did this breathing problem begin?"      Last week 3. PATTERN "Does the difficult breathing come and go, or has it been constant since it started?"      Yes  4. SEVERITY: "How bad is your breathing?" (e.g., mild, moderate, severe)    - MILD: No SOB at rest, mild SOB with walking, speaks normally in sentences, can lay down, no retractions, pulse < 100.    - MODERATE: SOB at rest, SOB with minimal exertion and prefers to sit, cannot lie down flat, speaks in phrases, mild retractions, audible wheezing, pulse 100-120.    - SEVERE: Very SOB at rest, speaks in single words, struggling to breathe, sitting hunched forward, retractions, pulse > 120      Moderate  5. RECURRENT SYMPTOM: "Have you had difficulty breathing before?" If Yes, ask: "When was the last time?" and "What happened that time?"      Years  ago with sinus infection 6. CARDIAC HISTORY: "Do you have any history of heart disease?" (e.g., heart attack, angina, bypass surgery, angioplasty)      No  7. LUNG HISTORY: "Do you have any history of lung disease?"  (e.g., pulmonary embolus, asthma, emphysema)     no 8. CAUSE: "What do you think is causing the breathing problem?"      na 9. OTHER SYMPTOMS: "Do you have any other symptoms? (e.g., dizziness, runny nose, cough, chest pain, fever)     Runny nose , cough,  10. PREGNANCY: "Is there any chance you are pregnant?" "When was your last menstrual period?"       na 11. TRAVEL: "Have you traveled out of the country in the last month?" (e.g., travel history, exposures)       na  Protocols used: BREATHING DIFFICULTY-A-AH

## 2020-07-30 NOTE — Telephone Encounter (Signed)
Sounds like symptoms have been worsening, suggest re-evaluation with virtual viist.

## 2020-07-31 ENCOUNTER — Encounter: Payer: Medicaid Other | Admitting: Family Medicine

## 2020-07-31 NOTE — Telephone Encounter (Signed)
Patient was advised and scheduled virtual visit tomorrow, 08/01/2020 @ 1:00 PM.

## 2020-08-01 ENCOUNTER — Telehealth (INDEPENDENT_AMBULATORY_CARE_PROVIDER_SITE_OTHER): Payer: Medicaid Other | Admitting: Physician Assistant

## 2020-08-01 DIAGNOSIS — J40 Bronchitis, not specified as acute or chronic: Secondary | ICD-10-CM

## 2020-08-01 MED ORDER — ALBUTEROL SULFATE HFA 108 (90 BASE) MCG/ACT IN AERS: 2.0000 | INHALATION_SPRAY | Freq: Four times a day (QID) | RESPIRATORY_TRACT | 2 refills | Status: DC | PRN

## 2020-08-01 MED ORDER — PREDNISONE 10 MG PO TABS: ORAL_TABLET | ORAL | 0 refills | Status: DC

## 2020-08-01 NOTE — Patient Instructions (Signed)

## 2020-08-01 NOTE — Progress Notes (Signed)
MyChart Video Visit    Virtual Visit via Video Note   This visit type was conducted due to national recommendations for restrictions regarding the COVID-19 Pandemic (e.g. social distancing) in an effort to limit this patient's exposure and mitigate transmission in our community. This patient is at least at moderate risk for complications without adequate follow up. This format is felt to be most appropriate for this patient at this time. Physical exam was limited by quality of the video and audio technology used for the visit.   Patient location: Home Provider location: Office   I discussed the limitations of evaluation and management by telemedicine and the availability of in person appointments. The patient expressed understanding and agreed to proceed.  Patient: Kelsey Hughes   DOB: [birth year]   40 y.o. Female  MRN: [id number] Visit Date: 08/01/2020  Today's healthcare provider: Trinna Post, PA-C   Chief Complaint  Patient presents with  . URI   Subjective    URI  This is a recurrent problem. The current episode started 1 to 4 weeks ago. The problem has been unchanged. There has been no fever. Associated symptoms include chest pain, congestion, coughing, ear pain, headaches, rhinorrhea and wheezing. Pertinent negatives include no nausea, sinus pain, sneezing or sore throat.         Medications: Outpatient Medications Prior to Visit  Medication Sig  . cholecalciferol (VITAMIN D3) 25 MCG (1000 UT) tablet Take 1,000 Units by mouth daily.  . clotrimazole-betamethasone (LOTRISONE) cream Apply 1 application topically 2 (two) times daily.  . fluticasone (FLONASE) 50 MCG/ACT nasal spray Place 2 sprays into both nostrils daily.  . folic acid (FOLVITE) 1 MG tablet Take 1 mg by mouth daily.  Marland Kitchen letrozole (FEMARA) 2.5 MG tablet Take 1 tablet (2.5 mg total) by mouth daily. Take on days 3-7 after spontaneous menses (Patient not taking: No sig reported)  . meclizine (ANTIVERT) 25 MG  tablet Take 1 tablet (25 mg total) by mouth 3 (three) times daily as needed for dizziness. (Patient not taking: No sig reported)  . Multiple Vitamin (MULTIVITAMIN) tablet Take 1 tablet by mouth daily.  . Omega-3 Fatty Acids (FISH OIL PO) Take by mouth.  Marland Kitchen omeprazole (PRILOSEC) 20 MG capsule Take 1 capsule (20 mg total) by mouth daily.  . promethazine-dextromethorphan (PROMETHAZINE-DM) 6.25-15 MG/5ML syrup Take 5 mLs by mouth at bedtime as needed.   No facility-administered medications prior to visit.    Review of Systems  HENT: Positive for congestion, ear pain and rhinorrhea. Negative for sinus pain, sneezing and sore throat.   Respiratory: Positive for cough and wheezing.   Cardiovascular: Positive for chest pain.  Gastrointestinal: Negative for nausea.  Neurological: Positive for headaches.      Objective    There were no vitals taken for this visit.   Physical Exam Constitutional:      Appearance: Normal appearance.  Pulmonary:     Effort: Pulmonary effort is normal. No respiratory distress.  Neurological:     Mental Status: She is alert.  Psychiatric:        Mood and Affect: Mood normal.        Behavior: Behavior normal.        Assessment & Plan    1. Bronchitis  - predniSONE (DELTASONE) 10 MG tablet; Take 6 pills on day 1, 5 pills on day 2 and so on until complete.  Dispense: 21 tablet; Refill: 0 - albuterol (VENTOLIN HFA) 108 (90 Base) MCG/ACT inhaler; Inhale 2  puffs into the lungs every 6 (six) hours as needed for wheezing or shortness of breath.  Dispense: 1 each; Refill: 2   No follow-ups on file.     I discussed the assessment and treatment plan with the patient. The patient was provided an opportunity to ask questions and all were answered. The patient agreed with the plan and demonstrated an understanding of the instructions.   The patient was advised to call back or seek an in-person evaluation if the symptoms worsen or if the condition fails to improve  as anticipated.   ITrinna Post, PA-C, have reviewed all documentation for this visit. The documentation on 08/01/20 for the exam, diagnosis, procedures, and orders are all accurate and complete.  The entirety of the information documented in the History of Present Illness, Review of Systems and Physical Exam were personally obtained by me. Portions of this information were initially documented by [facility] and reviewed by me for thoroughness and accuracy.    Paulene Floor [facility] [phone] (phone) [phone] (fax)  [place]

## 2020-08-12 ENCOUNTER — Ambulatory Visit (INDEPENDENT_AMBULATORY_CARE_PROVIDER_SITE_OTHER): Payer: Medicaid Other | Admitting: Family Medicine

## 2020-08-12 ENCOUNTER — Other Ambulatory Visit: Payer: Self-pay

## 2020-08-12 ENCOUNTER — Encounter: Payer: Self-pay | Admitting: Family Medicine

## 2020-08-12 VITALS — BP 142/102 | HR 93 | Temp 98.3°F | Resp 16 | Ht 66.0 in | Wt 298.3 lb

## 2020-08-12 DIAGNOSIS — Z1159 Encounter for screening for other viral diseases: Secondary | ICD-10-CM | POA: Diagnosis not present

## 2020-08-12 DIAGNOSIS — H60392 Other infective otitis externa, left ear: Secondary | ICD-10-CM

## 2020-08-12 DIAGNOSIS — E782 Mixed hyperlipidemia: Secondary | ICD-10-CM

## 2020-08-12 DIAGNOSIS — E559 Vitamin D deficiency, unspecified: Secondary | ICD-10-CM | POA: Diagnosis not present

## 2020-08-12 DIAGNOSIS — R7303 Prediabetes: Secondary | ICD-10-CM

## 2020-08-12 DIAGNOSIS — Z6841 Body Mass Index (BMI) 40.0 and over, adult: Secondary | ICD-10-CM | POA: Diagnosis not present

## 2020-08-12 DIAGNOSIS — Z0000 Encounter for general adult medical examination without abnormal findings: Secondary | ICD-10-CM

## 2020-08-12 MED ORDER — CORTISPORIN-TC 3.3-3-10-0.5 MG/ML OT SUSP: 4.0000 [drp] | Freq: Four times a day (QID) | OTIC | 0 refills | Status: DC

## 2020-08-12 MED ORDER — FLUTICASONE PROPIONATE 50 MCG/ACT NA SUSP: 2.0000 | Freq: Every day | NASAL | 6 refills | Status: DC

## 2020-08-12 NOTE — Assessment & Plan Note (Signed)
Reviewed last lipid panel Not currently on a statin Recheck FLP and CMP Discussed diet and exercise  

## 2020-08-12 NOTE — Progress Notes (Signed)
Complete physical exam   Patient: Kelsey Hughes   DOB: Feb 02, 1981   40 y.o. Female  MRN: [id number] Visit Date: 08/12/2020  Today's healthcare provider: Lavon Paganini, MD   Chief Complaint  Patient presents with  . Annual Exam   Subjective    Kelsey Hughes is a 40 y.o. female who presents today for a complete physical exam.  She reports consuming a general diet. The patient does not participate in regular exercise at present. She generally feels fairly well. She reports sleeping fairly well. She does have additional problems to discuss today.  HPI  11/17/2016 Pap-negative 11/17/2016 HPV-negative   Past Medical History:  Diagnosis Date  . Allergy   . Anemia   . Cutaneous skin tags   . Infertility, female    Past Surgical History:  Procedure Laterality Date  . ANTERIOR CRUCIATE LIGAMENT REPAIR    . DILATION AND CURETTAGE, DIAGNOSTIC / THERAPEUTIC     Social History   Socioeconomic History  . Marital status: Single    Spouse name: Not on file  . Number of children: Not on file  . Years of education: Not on file  . Highest education level: Not on file  Occupational History  . Occupation: Unemployed  Tobacco Use  . Smoking status: Never Smoker  . Smokeless tobacco: Never Used  Vaping Use  . Vaping Use: Never used  Substance and Sexual Activity  . Alcohol use: Yes    Comment: occasional, not every week  . Drug use: No  . Sexual activity: Yes    Partners: Male    Birth control/protection: None  Other Topics Concern  . Not on file  Social History Narrative  . Not on file   Social Determinants of Health   Financial Resource Strain: Not on file  Food Insecurity: Not on file  Transportation Needs: Not on file  Physical Activity: Not on file  Stress: Not on file  Social Connections: Not on file  Intimate Partner Violence: Not on file   Family Status  Relation Name Status  . Father  Deceased  . Mother  Alive  . Mat Aunt  (Not Specified)  . Mat  Uncle  (Not Specified)  . Neg Hx  (Not Specified)   Family History  Problem Relation Age of Onset  . Hypertension Father   . Heart disease Father   . Stroke Father   . Hypertension Mother   . Atrial fibrillation Mother   . Sleep apnea Mother   . Diabetes Maternal Aunt   . Diabetes Maternal Uncle   . Colon cancer Neg Hx   . Breast cancer Neg Hx    Allergies  Allergen Reactions  . Sulfonamide Derivatives     REACTION: ??? reaction during childhood    Patient Care Team: Virginia Crews, MD as PCP - General (Family Medicine)   Medications: Outpatient Medications Prior to Visit  Medication Sig  . albuterol (VENTOLIN HFA) 108 (90 Base) MCG/ACT inhaler Inhale 2 puffs into the lungs every 6 (six) hours as needed for wheezing or shortness of breath.  . cholecalciferol (VITAMIN D3) 25 MCG (1000 UT) tablet Take 1,000 Units by mouth daily.  . clotrimazole-betamethasone (LOTRISONE) cream Apply 1 application topically 2 (two) times daily.  . vitamin B-12 (CYANOCOBALAMIN) 1000 MCG tablet Take 1,000 mcg by mouth daily.  . [DISCONTINUED] fluticasone (FLONASE) 50 MCG/ACT nasal spray Place 2 sprays into both nostrils daily.  Marland Kitchen letrozole (FEMARA) 2.5 MG tablet Take 1 tablet (2.5  mg total) by mouth daily. Take on days 3-7 after spontaneous menses (Patient not taking: No sig reported)  . Multiple Vitamin (MULTIVITAMIN) tablet Take 1 tablet by mouth daily.  . Omega-3 Fatty Acids (FISH OIL PO) Take by mouth.  Marland Kitchen omeprazole (PRILOSEC) 20 MG capsule Take 1 capsule (20 mg total) by mouth daily.  . [DISCONTINUED] folic acid (FOLVITE) 1 MG tablet Take 1 mg by mouth daily.  . [DISCONTINUED] meclizine (ANTIVERT) 25 MG tablet Take 1 tablet (25 mg total) by mouth 3 (three) times daily as needed for dizziness. (Patient not taking: No sig reported)  . [DISCONTINUED] predniSONE (DELTASONE) 10 MG tablet Take 6 pills on day 1, 5 pills on day 2 and so on until complete.  . [DISCONTINUED]  promethazine-dextromethorphan (PROMETHAZINE-DM) 6.25-15 MG/5ML syrup Take 5 mLs by mouth at bedtime as needed.   No facility-administered medications prior to visit.    Review of Systems  Constitutional: Negative.   HENT: Positive for ear pain.   Eyes: Negative.   Respiratory: Negative.   Cardiovascular: Negative.   Gastrointestinal: Negative.   Endocrine: Negative.   Genitourinary: Negative.   Musculoskeletal: Negative.   Skin: Positive for rash.  Neurological: Positive for light-headedness and headaches.  Hematological: Negative.   Psychiatric/Behavioral: Negative.     Last CBC Lab Results  Component Value Date   WBC 8.2 08/28/2018   HGB 13.5 08/28/2018   HCT 41.4 08/28/2018   MCV 94.3 08/28/2018   MCH 30.8 08/28/2018   RDW 13.7 08/28/2018   PLT 289 81/19/1478   Last metabolic panel Lab Results  Component Value Date   GLUCOSE 90 07/31/2019   NA 141 07/31/2019   K 4.3 07/31/2019   CL 104 07/31/2019   CO2 20 07/31/2019   BUN 8 07/31/2019   CREATININE 0.72 07/31/2019   GFRNONAA 104 07/31/2019   GFRAA 120 07/31/2019   CALCIUM 9.3 07/31/2019   PROT 7.1 07/31/2019   ALBUMIN 4.1 07/31/2019   LABGLOB 3.0 07/31/2019   AGRATIO 1.4 07/31/2019   BILITOT 0.3 07/31/2019   ALKPHOS 78 07/31/2019   AST 15 07/31/2019   ALT 21 07/31/2019   ANIONGAP 9 08/28/2018   Last lipids Lab Results  Component Value Date   CHOL 232 (H) 07/31/2019   HDL 44 07/31/2019   LDLCALC 165 (H) 07/31/2019   LDLDIRECT 130 (H) 09/18/2013   TRIG 128 07/31/2019   CHOLHDL 5.3 (H) 07/31/2019   Last hemoglobin A1c Lab Results  Component Value Date   HGBA1C 5.7 (H) 07/31/2019   Last thyroid functions Lab Results  Component Value Date   TSH 1.470 11/29/2018      Objective    BP (!) 142/102   Pulse 93   Temp 98.3 F (36.8 C) (Oral)   Resp 16   Ht 5\' 6"  (1.676 m)   Wt 298 lb 4.8 oz (135.3 kg)   BMI 48.15 kg/m  BP Readings from Last 3 Encounters:  08/12/20 (!) 142/102  02/29/20  135/81  12/12/19 130/90   Wt Readings from Last 3 Encounters:  08/12/20 298 lb 4.8 oz (135.3 kg)  02/29/20 289 lb (131.1 kg)  12/12/19 283 lb 6.4 oz (128.5 kg)      Physical Exam    Last depression screening scores PHQ 2/9 Scores 08/12/2020 07/27/2019 02/09/2019  PHQ - 2 Score 0 1 2  PHQ- 9 Score 0 3 5   Last fall risk screening Fall Risk  08/12/2020  Falls in the past year? 0  Number falls in past  yr: 0  Injury with Fall? 0  Risk for fall due to : No Fall Risks  Follow up Falls evaluation completed   Last Audit-C alcohol use screening Alcohol Use Disorder Test (AUDIT) 08/12/2020  1. How often do you have a drink containing alcohol? 1  2. How many drinks containing alcohol do you have on a typical day when you are drinking? 0  3. How often do you have six or more drinks on one occasion? 0  AUDIT-C Score 1  Alcohol Brief Interventions/Follow-up AUDIT Score <7 follow-up not indicated   A score of 3 or more in women, and 4 or more in men indicates increased risk for alcohol abuse, EXCEPT if all of the points are from question 1   No results found for any visits on 08/12/20.  Assessment & Plan    Routine Health Maintenance and Physical Exam  Exercise Activities and Dietary recommendations Goals   None     Immunization History  Administered Date(s) Administered  . Influenza,inj,Quad PF,6+ Mos 02/09/2019, 03/10/2020  . PFIZER(Purple Top)SARS-COV-2 Vaccination 10/25/2019, 11/20/2019    Health Maintenance  Topic Date Due  . Hepatitis C Screening  Never done  . TETANUS/TDAP  Never done  . COVID-19 Vaccine (3 - Booster for Pfizer series) 05/21/2020  . PAP SMEAR-Modifier  11/17/2021  . INFLUENZA VACCINE  Completed  . HIV Screening  Completed    Discussed health benefits of physical activity, and encouraged her to engage in regular exercise appropriate for her age and condition.  Due for TDap and COVID booster - will plan to get these in the next few weeks (just  finished prednisone)  Problem List Items Addressed This Visit      Other   Morbid obesity ([place])    Discussed importance of healthy weight management Discussed diet and exercise Patient will start with eating more fruits and veggies and cutting back on soda intake Screening labs      Relevant Orders   Lipid panel   Comprehensive metabolic panel   Prediabetes    Recommend low carb diet Recheck A1c       Relevant Orders   Hemoglobin A1c   Avitaminosis D    Recheck level      Mixed hyperlipidemia    Reviewed last lipid panel Not currently on a statin Recheck FLP and CMP Discussed diet and exercise       Relevant Orders   Lipid panel   Comprehensive metabolic panel    Other Visit Diagnoses    Encounter for annual physical exam    -  Primary   Relevant Orders   Hemoglobin A1c   Lipid panel   Comprehensive metabolic panel   Hepatitis C Antibody   BMI 45.0-49.9, adult ([place])       Need for hepatitis C screening test       Relevant Orders   Hepatitis C Antibody   Other infective acute otitis externa of left ear         - new finding - ok to d/c prednisone - treat with cortisporin drops - return precautions discussed  Return in about 6 weeks (around 09/23/2020) for BP f/u, chronic disease f/u.     I, Lavon Paganini, MD, have reviewed all documentation for this visit. The documentation on 08/12/20 for the exam, diagnosis, procedures, and orders are all accurate and complete.   Bacigalupo, Dionne Bucy, MD, MPH [facility]

## 2020-08-12 NOTE — Assessment & Plan Note (Signed)
Recheck level 

## 2020-08-12 NOTE — Patient Instructions (Addendum)
Preventive Care 84-42 Years Old, Female Preventive care refers to lifestyle choices and visits with your health care provider that can promote health and wellness. This includes:  A yearly physical exam. This is also called an annual wellness visit.  Regular dental and eye exams.  Immunizations.  Screening for certain conditions.  Healthy lifestyle choices, such as: ? Eating a healthy diet. ? Getting regular exercise. ? Not using drugs or products that contain nicotine and tobacco. ? Limiting alcohol use. What can I expect for my preventive care visit? Physical exam Your health care provider will check your:  Height and weight. These may be used to calculate your BMI (body mass index). BMI is a measurement that tells if you are at a healthy weight.  Heart rate and blood pressure.  Body temperature.  Skin for abnormal spots. Counseling Your health care provider may ask you questions about your:  Past medical problems.  Family's medical history.  Alcohol, tobacco, and drug use.  Emotional well-being.  Home life and relationship well-being.  Sexual activity.  Diet, exercise, and sleep habits.  Work and work Statistician.  Access to firearms.  Method of birth control.  Menstrual cycle.  Pregnancy history. What immunizations do I need? Vaccines are usually given at various ages, according to a schedule. Your health care provider will recommend vaccines for you based on your age, medical history, and lifestyle or other factors, such as travel or where you work.   What tests do I need? Blood tests  Lipid and cholesterol levels. These may be checked every 5 years, or more often if you are over 3 years old.  Hepatitis C test.  Hepatitis B test. Screening  Lung cancer screening. You may have this screening every year starting at age 73 if you have a 30-pack-year history of smoking and currently smoke or have quit within the past 15 years.  Colorectal cancer  screening. ? All adults should have this screening starting at age 52 and continuing until age 17. ? Your health care provider may recommend screening at age 49 if you are at increased risk. ? You will have tests every 1-10 years, depending on your results and the type of screening test.  Diabetes screening. ? This is done by checking your blood sugar (glucose) after you have not eaten for a while (fasting). ? You may have this done every 1-3 years.  Mammogram. ? This may be done every 1-2 years. ? Talk with your health care provider about when you should start having regular mammograms. This may depend on whether you have a family history of breast cancer.  BRCA-related cancer screening. This may be done if you have a family history of breast, ovarian, tubal, or peritoneal cancers.  Pelvic exam and Pap test. ? This may be done every 3 years starting at age 10. ? Starting at age 11, this may be done every 5 years if you have a Pap test in combination with an HPV test. Other tests  STD (sexually transmitted disease) testing, if you are at risk.  Bone density scan. This is done to screen for osteoporosis. You may have this scan if you are at high risk for osteoporosis. Talk with your health care provider about your test results, treatment options, and if necessary, the need for more tests. Follow these instructions at home: Eating and drinking  Eat a diet that includes fresh fruits and vegetables, whole grains, lean protein, and low-fat dairy products.  Take vitamin and mineral supplements  as recommended by your health care provider.  Do not drink alcohol if: ? Your health care provider tells you not to drink. ? You are pregnant, may be pregnant, or are planning to become pregnant.  If you drink alcohol: ? Limit how much you have to 0-1 drink a day. ? Be aware of how much alcohol is in your drink. In the [place] one drink equals one 12 oz bottle of beer (355 mL), one 5 oz glass of  wine (148 mL), or one 1 oz glass of hard liquor (44 mL).   Lifestyle  Take daily care of your teeth and gums. Brush your teeth every morning and night with fluoride toothpaste. Floss one time each day.  Stay active. Exercise for at least 30 minutes 5 or more days each week.  Do not use any products that contain nicotine or tobacco, such as cigarettes, e-cigarettes, and chewing tobacco. If you need help quitting, ask your health care provider.  Do not use drugs.  If you are sexually active, practice safe sex. Use a condom or other form of protection to prevent STIs (sexually transmitted infections).  If you do not wish to become pregnant, use a form of birth control. If you plan to become pregnant, see your health care provider for a prepregnancy visit.  If told by your health care provider, take low-dose aspirin daily starting at age 13.  Find healthy ways to cope with stress, such as: ? Meditation, yoga, or listening to music. ? Journaling. ? Talking to a trusted person. ? Spending time with friends and family. Safety  Always wear your seat belt while driving or riding in a vehicle.  Do not drive: ? If you have been drinking alcohol. Do not ride with someone who has been drinking. ? When you are tired or distracted. ? While texting.  Wear a helmet and other protective equipment during sports activities.  If you have firearms in your house, make sure you follow all gun safety procedures. What's next?  Visit your health care provider once a year for an annual wellness visit.  Ask your health care provider how often you should have your eyes and teeth checked.  Stay up to date on all vaccines. This information is not intended to replace advice given to you by your health care provider. Make sure you discuss any questions you have with your health care provider. Document Revised: 03/11/2020 Document Reviewed: 02/16/2018 Elsevier Patient Education  2021 [employer].

## 2020-08-12 NOTE — Assessment & Plan Note (Signed)
Discussed importance of healthy weight management Discussed diet and exercise Patient will start with eating more fruits and veggies and cutting back on soda intake Screening labs

## 2020-08-12 NOTE — Assessment & Plan Note (Signed)
Recommend low carb diet °Recheck A1c  °

## 2020-08-13 LAB — COMPREHENSIVE METABOLIC PANEL
ALT: 24 IU/L (ref 0–32)
AST: 20 IU/L (ref 0–40)
Albumin/Globulin Ratio: 1.3 (ref 1.2–2.2)
Albumin: 4 g/dL (ref 3.8–4.8)
Alkaline Phosphatase: 76 IU/L (ref 44–121)
BUN/Creatinine Ratio: 17 (ref 9–23)
BUN: 13 mg/dL (ref 6–24)
Bilirubin Total: <0.2 mg/dL (ref 0.0–1.2)
CO2: 22 mmol/L (ref 20–29)
Calcium: 8.9 mg/dL (ref 8.7–10.2)
Chloride: 103 mmol/L (ref 96–106)
Creatinine, Ser: 0.78 mg/dL (ref 0.57–1.00)
GFR calc Af Amer: 108 mL/min/{1.73_m2} (ref >59)
GFR calc non Af Amer: 94 mL/min/{1.73_m2} (ref >59)
Globulin, Total: 3 g/dL (ref 1.5–4.5)
Glucose: 118 mg/dL — ABNORMAL HIGH (ref 65–99)
Potassium: 4.4 mmol/L (ref 3.5–5.2)
Sodium: 142 mmol/L (ref 134–144)
Total Protein: 7 g/dL (ref 6.0–8.5)

## 2020-08-13 LAB — HEMOGLOBIN A1C
Est. average glucose Bld gHb Est-mCnc: 128 mg/dL
Hgb A1c MFr Bld: 6.1 % — ABNORMAL HIGH (ref 4.8–5.6)

## 2020-08-13 LAB — LIPID PANEL
Chol/HDL Ratio: 5.5 ratio — ABNORMAL HIGH (ref 0.0–4.4)
Cholesterol, Total: 251 mg/dL — ABNORMAL HIGH (ref 100–199)
HDL: 46 mg/dL (ref >39)
LDL Chol Calc (NIH): 162 mg/dL — ABNORMAL HIGH (ref 0–99)
Triglycerides: 232 mg/dL — ABNORMAL HIGH (ref 0–149)
VLDL Cholesterol Cal: 43 mg/dL — ABNORMAL HIGH (ref 5–40)

## 2020-08-13 LAB — HEPATITIS C ANTIBODY: Hep C Virus Ab: <0.1 s/co ratio (ref 0.0–0.9)

## 2020-08-14 ENCOUNTER — Telehealth: Payer: Self-pay | Admitting: Family Medicine

## 2020-08-14 ENCOUNTER — Ambulatory Visit: Payer: Self-pay | Admitting: *Deleted

## 2020-08-14 NOTE — Telephone Encounter (Addendum)
Addendum:  Called patient back and left message to call back in am if no contact noted this pm concerning her request for a different ear drop was ordered. Encouraged patient to call clinic in am for follow up .   Patient c/o moderate to severe ear pain. Was seen on 08/12/20 by PCP for ear pain. C/o throbbing and thumping pain in left ear. Ear drops cortisporin TC prescribed and pharmacy has reported medication will take up to 10 days to receive and approval for insurance. Patient reports she can not wait that long due to pain. PCP requested if patient required a different ear drop to be prescribed to call back. On call provider called at [phone] to request a different Rx for patient. Care advise given. Patient verbalized understanding of care advise and to call back or go to [facility] or ED if symptoms worsen.    Reason for Disposition . Diagnosis is uncertain . Earache  (Exceptions: brief ear pain of < 60 minutes duration, earache occurring during air travel  Answer Assessment - Initial Assessment Questions 1. LOCATION: "Which ear is involved?"      *No Answer* 2. SYMPTOMS: "What are the main symptoms?" (e.g., pain, redness, itching, discharge)     *No Answer* 3. MOVEMENT: "Does the pain increase when the ear is moved up and down?" Does pushing on the tab of tissue in the front of the ear increase the pain?"      *No Answer* 4. PAIN: "How bad is the pain?"  (Scale 1-10; mild, moderate or severe)   - MILD (1-3): doesn't interfere with normal activities    - MODERATE (4-7): interferes with normal activities or awakens from sleep    - SEVERE (8-10): excruciating pain, unable to do any normal activities      *No Answer* 5. ONSET: "When did the ear symptoms start?"      *No Answer* 6. DISCHARGE: "Is there any discharge? What color is it?"      *No Answer* 7. SWIMMING: "Have you been swimming recently?" If Yes, ask: "How often do you swim? Is it in a pool, lake or ocean?"      *No Answer* 8.  COTTON EAR SWABS: "Do you use cotton ear swabs (Q-tips)? How often?" (e.g., never, daily, weekly)     *No Answer* 9. PREGNANCY: "Is there any chance you are pregnant?" "When was your last menstrual period?"     *No Answer*  Answer Assessment - Initial Assessment Questions 1. LOCATION: "Which ear is involved?"       Left ear  2. SENSATION: "Describe how the ear feels." (e.g. stuffy, full, plugged)."      Throbbing and thumping sounds 3. ONSET:  "When did the ear symptoms start?"       Seen Tuesday by PCP 4. PAIN: "Do you also have an earache?" If Yes, ask: "How bad is it?" (Scale 1-10; or mild, moderate, severe)     severe 5. CAUSE: "What do you think is causing the ear congestion?"     Was sick and dx with "like swimmer's ear" 6. URI: "Do you have a runny nose or cough?"      na 7. NASAL ALLERGIES: "Are there symptoms of hay fever, such as sneezing or a clear nasal discharge?"     no 8. PREGNANCY: "Is there any chance you are pregnant?" "When was your last menstrual period?"     na  Answer Assessment - Initial Assessment Questions 1. LOCATION: "Which ear is involved?"  Left ear  2. ONSET: "When did the ear start hurting"      Was seen by PCP Tuesday 08/12/20 3. SEVERITY: "How bad is the pain?"  (Scale 1-10; mild, moderate or severe)   - MILD (1-3): doesn't interfere with normal activities    - MODERATE (4-7): interferes with normal activities or awakens from sleep    - SEVERE (8-10): excruciating pain, unable to do any normal activities      Moderate to severe 4. URI SYMPTOMS: "Do you have a runny nose or cough?"     na 5. FEVER: "Do you have a fever?" If Yes, ask: "What is your temperature, how was it measured, and when did it start?"     no 6. CAUSE: "Have you been swimming recently?", "How often do you use Q-TIPS?", "Have you had any recent air travel or scuba diving?"     na 7. OTHER SYMPTOMS: "Do you have any other symptoms?" (e.g., headache, stiff neck, dizziness,  vomiting, runny nose, decreased hearing)     No , throbbing and thumping pain 8. PREGNANCY: "Is there any chance you are pregnant?" "When was your last menstrual period?"     na  Protocols used: EAR - SWIMMER'S-A-AH, [place], [place]

## 2020-08-14 NOTE — Telephone Encounter (Signed)
Pt called back to report that her insurance has yet to approve her ear drops so she is asking for an alternative Rx because she cannot wait another day without relief  [id number] [address], [place] - [place] [address]  Phone: [phone] Fax: [phone]   Transferred to triage for severe ear pain, spreading around her head and jaw. Throbbing.

## 2020-08-15 MED ORDER — CIPROFLOXACIN-DEXAMETHASONE 0.3-0.1 % OT SUSP: 4.0000 [drp] | Freq: Two times a day (BID) | OTIC | 0 refills | Status: DC

## 2020-08-15 NOTE — Telephone Encounter (Signed)
See other message. Sent Ciprodex

## 2020-08-15 NOTE — Telephone Encounter (Signed)
Left detailed message for patient.

## 2020-08-15 NOTE — Telephone Encounter (Signed)
Please advise 

## 2020-09-12 ENCOUNTER — Other Ambulatory Visit: Payer: Self-pay | Admitting: Family Medicine

## 2020-09-23 ENCOUNTER — Other Ambulatory Visit: Payer: Self-pay

## 2020-09-23 ENCOUNTER — Ambulatory Visit (INDEPENDENT_AMBULATORY_CARE_PROVIDER_SITE_OTHER): Payer: Medicaid Other | Admitting: Family Medicine

## 2020-09-23 ENCOUNTER — Encounter: Payer: Self-pay | Admitting: Family Medicine

## 2020-09-23 VITALS — BP 144/96 | HR 86 | Temp 98.1°F | Resp 16 | Ht 66.0 in | Wt 304.0 lb

## 2020-09-23 DIAGNOSIS — R600 Localized edema: Secondary | ICD-10-CM | POA: Diagnosis not present

## 2020-09-23 DIAGNOSIS — I10 Essential (primary) hypertension: Secondary | ICD-10-CM

## 2020-09-23 DIAGNOSIS — Z6841 Body Mass Index (BMI) 40.0 and over, adult: Secondary | ICD-10-CM | POA: Diagnosis not present

## 2020-09-23 NOTE — Assessment & Plan Note (Signed)
Discussed importance of healthy weight management Discussed diet and exercise  

## 2020-09-23 NOTE — Assessment & Plan Note (Signed)
New diagnosis Persistently elevated blood pressure Discussed options for medications versus lifestyle management x3 months Patient prefers lifestyle management opportunity Discussed DASH diet and regular exercise Discussed goal blood pressure of less than 140/90 or ideally less than 130/80

## 2020-09-23 NOTE — Assessment & Plan Note (Signed)
New problem Likely related to being more sedentary at work Patient will try more movements, compression, elevation No red flag symptoms Symmetric and nontender Consider diuretic for blood pressure control as above if starting medication

## 2020-09-23 NOTE — Progress Notes (Signed)
Established patient visit   Patient: Kelsey Hughes   DOB: [birth year]   40 y.o. Female  MRN: [id number] Visit Date: 09/23/2020  Today's healthcare provider: Lavon Paganini, MD   Chief Complaint  Patient presents with  . Follow-up   Subjective    HPI  Follow up for blood pressure  The patient was last seen for this 6 weeks ago. Changes made at last visit include no changes.  She reports excellent compliance with treatment. She feels that condition is Unchanged. She is not having side effects.  -----------------------------------------------------------------------------------------  Patient Active Problem List   Diagnosis Date Noted  . Bilateral lower extremity edema 09/23/2020  . Primary hypertension 09/23/2020  . Other fatigue 07/27/2019  . Mixed hyperlipidemia 07/27/2019  . B12 deficiency 02/09/2019  . Avitaminosis D 02/09/2019  . Infertility, female 02/09/2019  . Morbid obesity ([place]) 09/18/2013  . Prediabetes 09/18/2013   Social History   Tobacco Use  . Smoking status: Never Smoker  . Smokeless tobacco: Never Used  Vaping Use  . Vaping Use: Never used  Substance Use Topics  . Alcohol use: Yes    Comment: occasional, not every week  . Drug use: No   Allergies  Allergen Reactions  . Sulfonamide Derivatives     REACTION: ??? reaction during childhood       Medications: Outpatient Medications Prior to Visit  Medication Sig  . albuterol (VENTOLIN HFA) 108 (90 Base) MCG/ACT inhaler Inhale 2 puffs into the lungs every 6 (six) hours as needed for wheezing or shortness of breath.  . cholecalciferol (VITAMIN D3) 25 MCG (1000 UT) tablet Take 1,000 Units by mouth daily.  . clotrimazole-betamethasone (LOTRISONE) cream Apply 1 application topically 2 (two) times daily.  . fluticasone (FLONASE) 50 MCG/ACT nasal spray Place 2 sprays into both nostrils daily.  . Multiple Vitamin (MULTIVITAMIN) tablet Take 1 tablet by mouth daily.  . Omega-3 Fatty Acids (FISH  OIL PO) Take by mouth.  Marland Kitchen omeprazole (PRILOSEC) 20 MG capsule TAKE 1 CAPSULE BY MOUTH EVERY DAY  . vitamin B-12 (CYANOCOBALAMIN) 1000 MCG tablet Take 1,000 mcg by mouth daily.  . [DISCONTINUED] ciprofloxacin-dexamethasone (CIPRODEX) OTIC suspension Place 4 drops into the left ear 2 (two) times daily.  . [DISCONTINUED] letrozole (FEMARA) 2.5 MG tablet Take 1 tablet (2.5 mg total) by mouth daily. Take on days 3-7 after spontaneous menses (Patient not taking: No sig reported)   No facility-administered medications prior to visit.    Review of Systems  Constitutional: Negative for appetite change, chills and fatigue.  Respiratory: Positive for shortness of breath. Negative for cough and chest tightness.   Cardiovascular: Positive for leg swelling. Negative for chest pain and palpitations.    Last CBC Lab Results  Component Value Date   WBC 8.2 08/28/2018   HGB 13.5 08/28/2018   HCT 41.4 08/28/2018   MCV 94.3 08/28/2018   MCH 30.8 08/28/2018   RDW 13.7 08/28/2018   PLT 289 94/85/4627   Last metabolic panel Lab Results  Component Value Date   GLUCOSE 118 (H) 08/12/2020   NA 142 08/12/2020   K 4.4 08/12/2020   CL 103 08/12/2020   CO2 22 08/12/2020   BUN 13 08/12/2020   CREATININE 0.78 08/12/2020   GFRNONAA 94 08/12/2020   GFRAA 108 08/12/2020   CALCIUM 8.9 08/12/2020   PROT 7.0 08/12/2020   ALBUMIN 4.0 08/12/2020   LABGLOB 3.0 08/12/2020   AGRATIO 1.3 08/12/2020   BILITOT <0.2 08/12/2020   ALKPHOS 76 08/12/2020  AST 20 08/12/2020   ALT 24 08/12/2020   ANIONGAP 9 08/28/2018   Last lipids Lab Results  Component Value Date   CHOL 251 (H) 08/12/2020   HDL 46 08/12/2020   LDLCALC 162 (H) 08/12/2020   LDLDIRECT 130 (H) 09/18/2013   TRIG 232 (H) 08/12/2020   CHOLHDL 5.5 (H) 08/12/2020        Objective    BP (!) 144/96 (BP Location: Left Arm, Patient Position: Sitting, Cuff Size: Large)   Pulse 86   Temp 98.1 F (36.7 C) (Oral)   Resp 16   Ht 5\' 6"  (1.676 m)    Wt (!) 304 lb (137.9 kg)   LMP 09/21/2020 (Exact Date)   SpO2 98%   BMI 49.07 kg/m  BP Readings from Last 3 Encounters:  09/23/20 (!) 144/96  08/12/20 (!) 142/102  02/29/20 135/81   Wt Readings from Last 3 Encounters:  09/23/20 (!) 304 lb (137.9 kg)  08/12/20 298 lb 4.8 oz (135.3 kg)  02/29/20 289 lb (131.1 kg)      Physical Exam Vitals reviewed.  Constitutional:      General: She is not in acute distress.    Appearance: Normal appearance. She is well-developed. She is not diaphoretic.  HENT:     Head: Normocephalic and atraumatic.  Eyes:     General: No scleral icterus.    Conjunctiva/sclera: Conjunctivae normal.  Neck:     Thyroid: No thyromegaly.  Cardiovascular:     Rate and Rhythm: Normal rate and regular rhythm.     Pulses: Normal pulses.     Heart sounds: Normal heart sounds. No murmur heard.   Pulmonary:     Effort: Pulmonary effort is normal. No respiratory distress.     Breath sounds: Normal breath sounds. No wheezing, rhonchi or rales.  Musculoskeletal:     Cervical back: Neck supple.     Right lower leg: Edema present.     Left lower leg: Edema present.  Lymphadenopathy:     Cervical: No cervical adenopathy.  Skin:    General: Skin is warm and dry.     Findings: No rash.  Neurological:     Mental Status: She is alert and oriented to person, place, and time. Mental status is at baseline.  Psychiatric:        Mood and Affect: Mood normal.        Behavior: Behavior normal.      No results found for any visits on 09/23/20.  Assessment & Plan     Problem List Items Addressed This Visit      Cardiovascular and Mediastinum   Primary hypertension - Primary    New diagnosis Persistently elevated blood pressure Discussed options for medications versus lifestyle management x3 months Patient prefers lifestyle management opportunity Discussed DASH diet and regular exercise Discussed goal blood pressure of less than 140/90 or ideally less than  130/80        Other   Morbid obesity ([place])    Discussed importance of healthy weight management Discussed diet and exercise       Bilateral lower extremity edema    New problem Likely related to being more sedentary at work Patient will try more movements, compression, elevation No red flag symptoms Symmetric and nontender Consider diuretic for blood pressure control as above if starting medication       Other Visit Diagnoses    BMI 45.0-49.9, adult ([place])           Return in about 3  months (around 12/23/2020) for chronic disease f/u.      I, Lavon Paganini, MD, have reviewed all documentation for this visit. The documentation on 09/23/20 for the exam, diagnosis, procedures, and orders are all accurate and complete.   Jemmie Rhinehart, Dionne Bucy, MD, MPH [facility]

## 2020-09-23 NOTE — Patient Instructions (Signed)

## 2020-11-21 IMAGING — US VENOUS DOPPLER ULTRASOUND OF LEFT LOWER EXTREMITY
1 series · 13 of 24 positions shown · non-contrast
Comparison: None.

CLINICAL DATA: 38-year-old female with left lower extremity calf
and ankle pain for the past 3 days



[Series 1: venous doppler ultrasound of left lower extremity · 0.07mm/px · 13 of 33 slices shown]
[im 1/33]
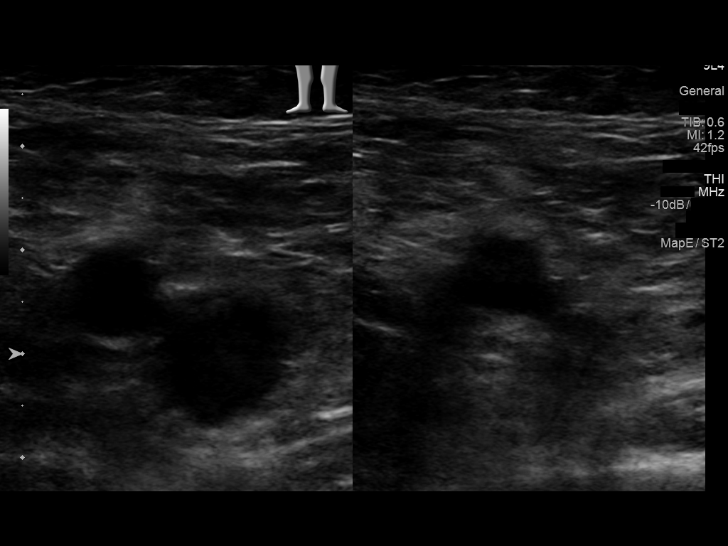
[im 3/33]
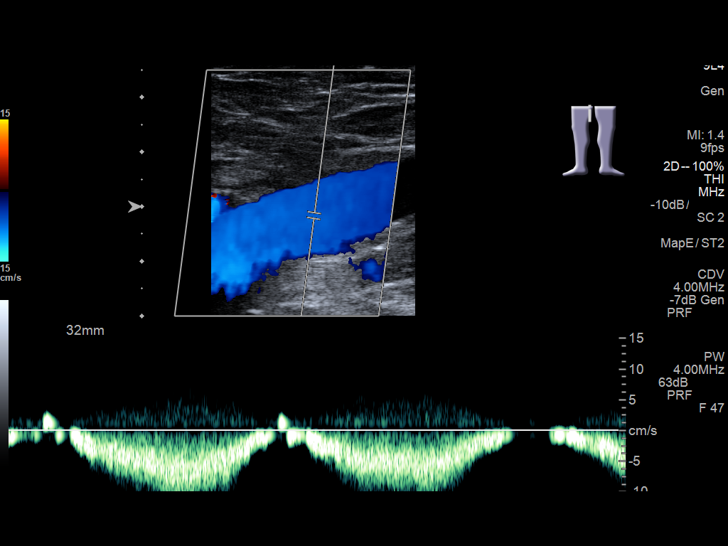
[im 6/33]
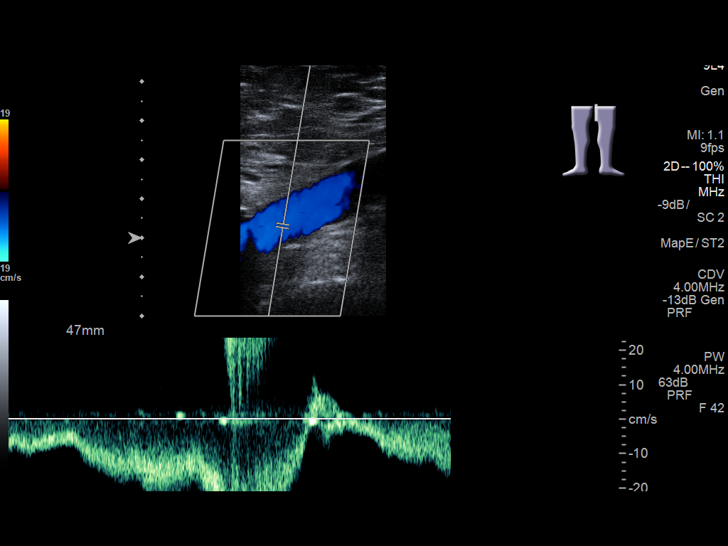
[im 9/33]
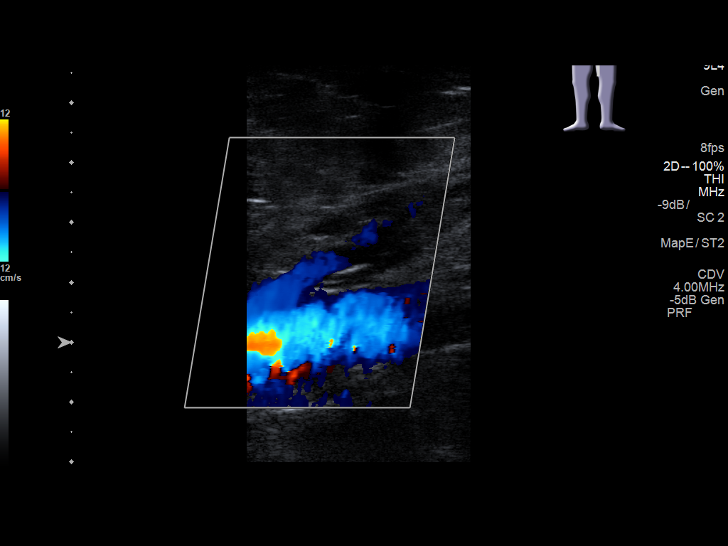
[im 12/33]
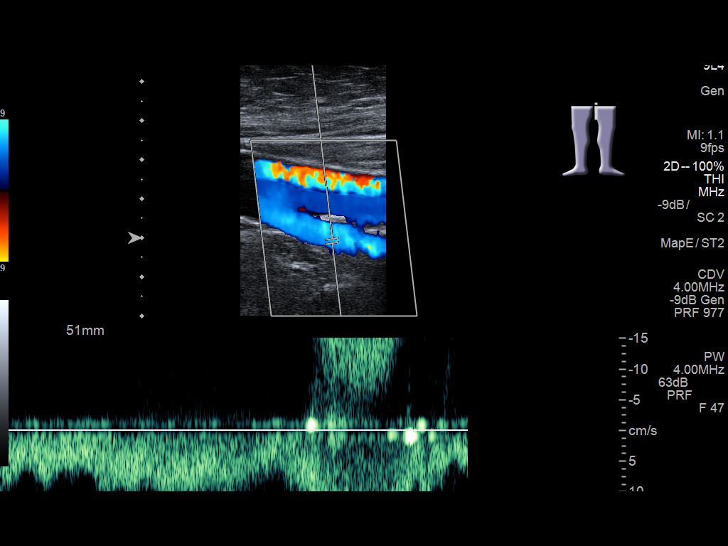
[im 14/33]
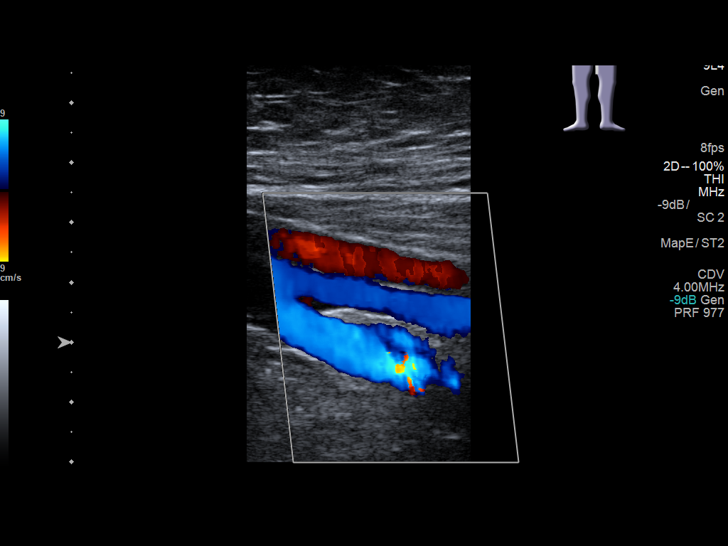
[im 17/33]
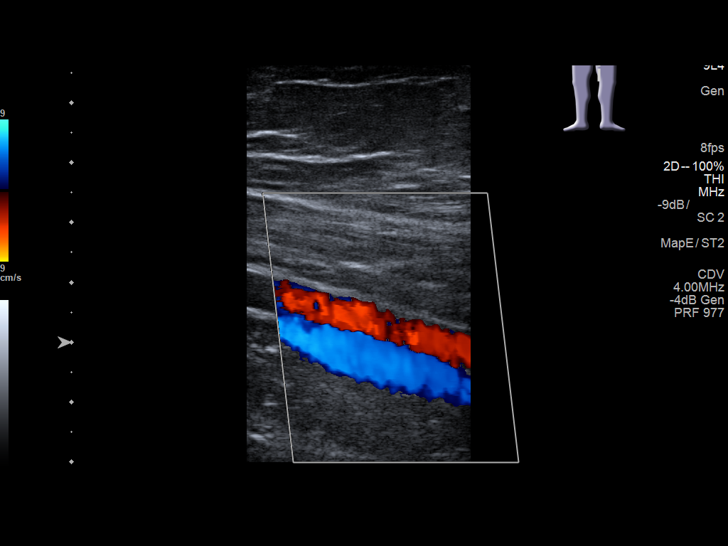
[im 19/33]
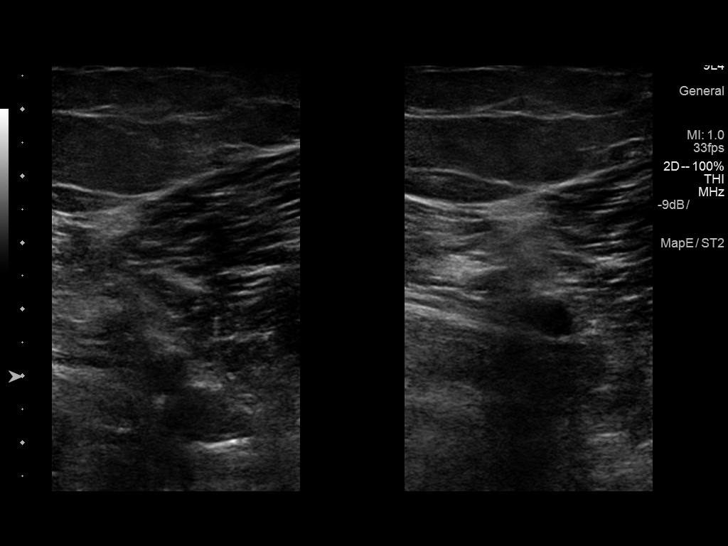
[im 21/33]
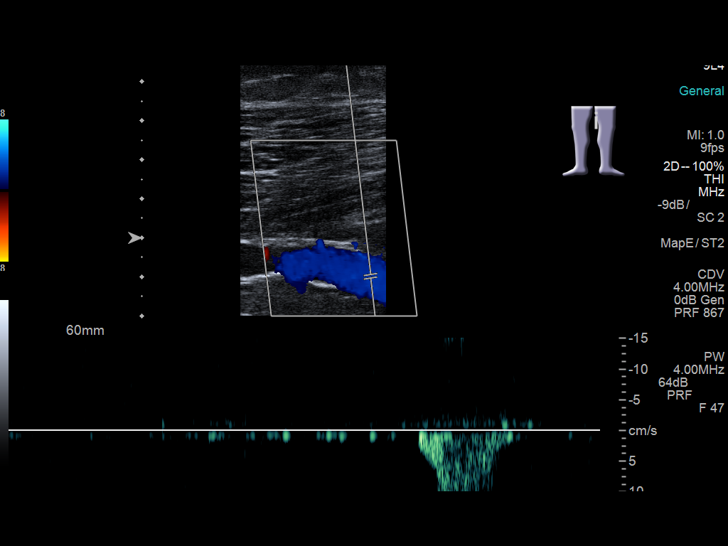
[im 24/33]
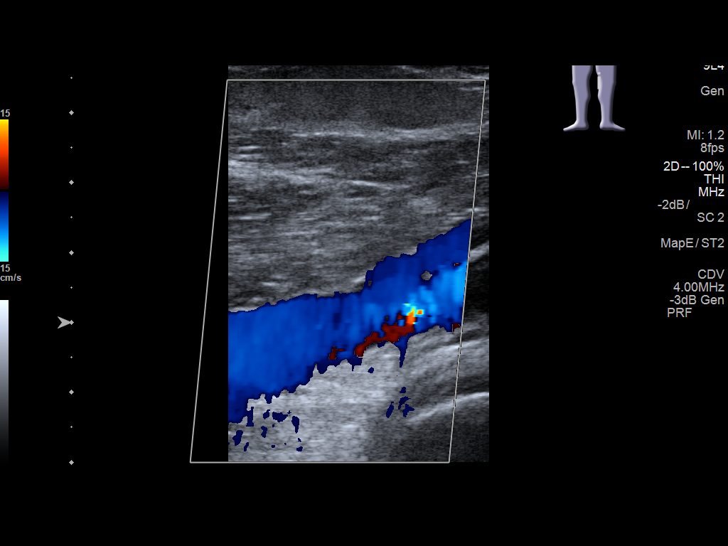
[im 27/33]
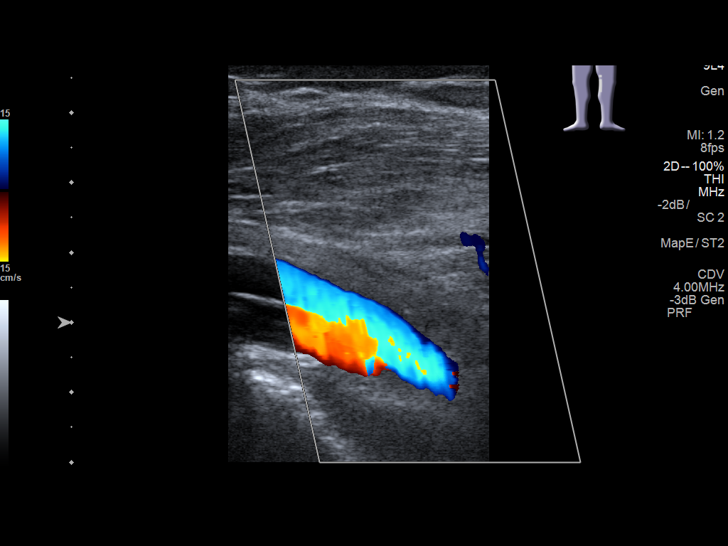
[im 30/33]
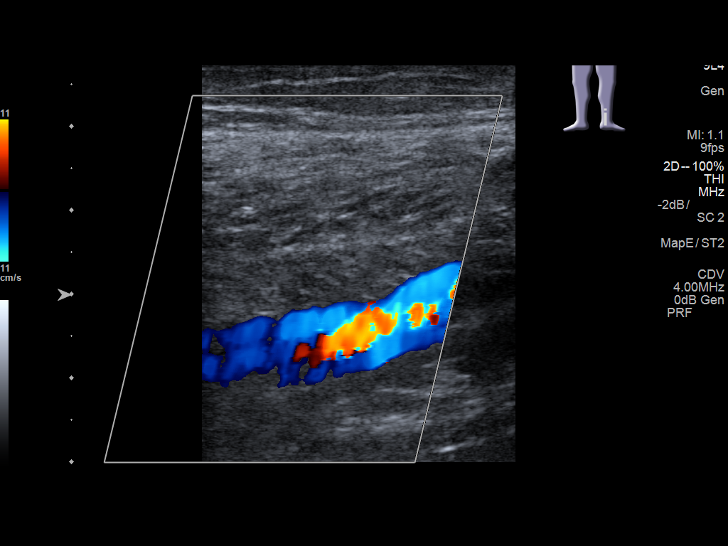
[im 33/33]
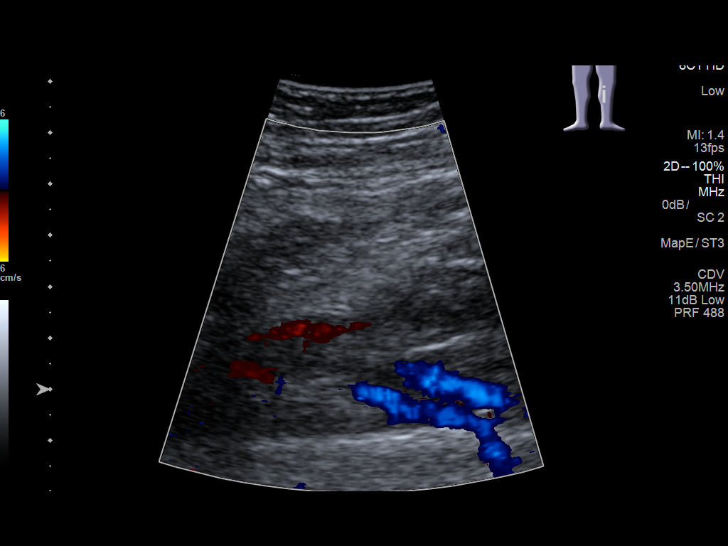

[13 of 24 positions shown; findings below may reference images not displayed]

FINDINGS: Contralateral Common Femoral Vein: Respiratory phasicity is normal
and symmetric with the symptomatic side. No evidence of thrombus.
Normal compressibility.

Common Femoral Vein: No evidence of thrombus. Normal
compressibility, respiratory phasicity and response to augmentation.

Saphenofemoral Junction: No evidence of thrombus. Normal
compressibility and flow on color Doppler imaging.

Profunda Femoral Vein: No evidence of thrombus. Normal
compressibility and flow on color Doppler imaging.

Femoral Vein: No evidence of thrombus. Normal compressibility,
respiratory phasicity and response to augmentation.

Popliteal Vein: No evidence of thrombus. Normal compressibility,
respiratory phasicity and response to augmentation.

Calf Veins: No evidence of thrombus. Normal compressibility and flow
on color Doppler imaging.

Superficial Great Saphenous Vein: No evidence of thrombus. Normal
compressibility.

Venous Reflux:  None.

Other Findings:  None.
IMPRESSION: No evidence of deep venous thrombosis.

## 2020-12-11 ENCOUNTER — Other Ambulatory Visit: Payer: Self-pay | Admitting: Family Medicine

## 2020-12-23 ENCOUNTER — Ambulatory Visit: Payer: Self-pay | Admitting: Family Medicine

## 2020-12-30 ENCOUNTER — Ambulatory Visit (INDEPENDENT_AMBULATORY_CARE_PROVIDER_SITE_OTHER): Payer: Medicaid Other | Admitting: Family Medicine

## 2020-12-30 ENCOUNTER — Encounter: Payer: Self-pay | Admitting: Family Medicine

## 2020-12-30 ENCOUNTER — Other Ambulatory Visit: Payer: Self-pay

## 2020-12-30 VITALS — BP 132/100 | HR 91 | Temp 98.1°F | Resp 16 | Ht 67.0 in | Wt 291.7 lb

## 2020-12-30 DIAGNOSIS — I10 Essential (primary) hypertension: Secondary | ICD-10-CM | POA: Diagnosis not present

## 2020-12-30 MED ORDER — LISINOPRIL 20 MG PO TABS: 20.0000 mg | ORAL_TABLET | Freq: Every day | ORAL | 3 refills | Status: DC

## 2020-12-30 NOTE — Assessment & Plan Note (Signed)
Congratulated on weight loss ?Discussed importance of healthy weight management ?Discussed diet and exercise  ?

## 2020-12-30 NOTE — Patient Instructions (Signed)

## 2020-12-30 NOTE — Assessment & Plan Note (Signed)
Recent diagnosis Persistently elevated BP Discussed lifestyle interventions Start lisinopril 20 mg daily Discussed goal BP of <130/80 Repeat BMP at next visit

## 2020-12-30 NOTE — Progress Notes (Signed)
Established patient visit   Patient: Kelsey Hughes   DOB: [birth year]   40 y.o. Female  MRN: [id number] Visit Date: 12/30/2020  Today's healthcare provider: Lavon Paganini, MD   Chief Complaint  Patient presents with   Hypertension    Subjective    Hypertension Pertinent negatives include no chest pain, headaches, neck pain, palpitations or shortness of breath.    Hypertension, follow-up  BP Readings from Last 3 Encounters:  12/30/20 (!) 132/100  09/23/20 (!) 144/96  08/12/20 (!) 142/102   Wt Readings from Last 3 Encounters:  12/30/20 291 lb 11.2 oz (132.3 kg)  09/23/20 (!) 304 lb (137.9 kg)  08/12/20 298 lb 4.8 oz (135.3 kg)     She was last seen for hypertension 3 months ago.  BP at that visit was 144/96. Management since that visit includes discussed options for medications, patient prefers lifestyle management. However she is amendable to blood pressure medication. She has noticed minimal LE swelling. She is exciting about losing weight.  She is following a Low Sodium diet. She is exercising. She does not smoke.  Use of agents associated with hypertension: none.   Outside blood pressures are about the same.  Symptoms: No chest pain No chest pressure  No palpitations No syncope  No dyspnea No orthopnea  No paroxysmal nocturnal dyspnea No lower extremity edema   Pertinent labs: Lab Results  Component Value Date   CHOL 251 (H) 08/12/2020   HDL 46 08/12/2020   LDLCALC 162 (H) 08/12/2020   LDLDIRECT 130 (H) 09/18/2013   TRIG 232 (H) 08/12/2020   CHOLHDL 5.5 (H) 08/12/2020   Lab Results  Component Value Date   NA 142 08/12/2020   K 4.4 08/12/2020   CREATININE 0.78 08/12/2020   GFRNONAA 94 08/12/2020   GFRAA 108 08/12/2020   GLUCOSE 118 (H) 08/12/2020     The 10-year ASCVD risk score Mikey Bussing DC Jr., et al., 2013) is: 2%   ---------------------------------------------------------------------------------------------------  Patient Active Problem  List   Diagnosis Date Noted   Bilateral lower extremity edema 09/23/2020   Primary hypertension 09/23/2020   Other fatigue 07/27/2019   Mixed hyperlipidemia 07/27/2019   B12 deficiency 02/09/2019   Avitaminosis D 02/09/2019   Infertility, female 02/09/2019   Morbid obesity ([place]) 09/18/2013   Prediabetes 09/18/2013   Social History   Tobacco Use   Smoking status: Never   Smokeless tobacco: Never  Vaping Use   Vaping Use: Never used  Substance Use Topics   Alcohol use: Yes    Comment: occasional, not every week   Drug use: No   Allergies  Allergen Reactions   Sulfonamide Derivatives     REACTION: ??? reaction during childhood             Medications: Outpatient Medications Prior to Visit  Medication Sig   albuterol (VENTOLIN HFA) 108 (90 Base) MCG/ACT inhaler Inhale 2 puffs into the lungs every 6 (six) hours as needed for wheezing or shortness of breath.   cholecalciferol (VITAMIN D3) 25 MCG (1000 UT) tablet Take 1,000 Units by mouth daily.   clotrimazole-betamethasone (LOTRISONE) cream Apply 1 application topically 2 (two) times daily.   fluticasone (FLONASE) 50 MCG/ACT nasal spray Place 2 sprays into both nostrils daily.   Multiple Vitamin (MULTIVITAMIN) tablet Take 1 tablet by mouth daily.   Omega-3 Fatty Acids (FISH OIL PO) Take by mouth.   omeprazole (PRILOSEC) 20 MG capsule TAKE 1 CAPSULE BY MOUTH EVERY DAY   vitamin B-12 (CYANOCOBALAMIN) 1000  MCG tablet Take 1,000 mcg by mouth daily.   No facility-administered medications prior to visit.   Review of Systems  Constitutional:  Negative for activity change, appetite change, chills, fatigue and fever.  HENT:  Negative for ear pain, sinus pressure, sinus pain and sore throat.   Eyes:  Negative for pain and visual disturbance.  Respiratory:  Negative for cough, chest tightness, shortness of breath and wheezing.   Cardiovascular:  Negative for chest pain, palpitations and leg swelling.  Gastrointestinal:  Negative  for abdominal pain, blood in stool, diarrhea, nausea and vomiting.  Genitourinary:  Negative for flank pain, frequency, pelvic pain and urgency.  Musculoskeletal:  Negative for back pain, myalgias and neck pain.  Neurological:  Negative for dizziness, weakness, light-headedness, numbness and headaches.     Objective    BP (!) 132/100 (BP Location: Left Arm, Patient Position: Sitting, Cuff Size: Large) Comment: manual  Pulse 91   Temp 98.1 F (36.7 C) (Oral)   Resp 16   Ht 5\' 7"  (1.702 m)   Wt 291 lb 11.2 oz (132.3 kg)   BMI 45.69 kg/m  BP Readings from Last 3 Encounters:  12/30/20 (!) 132/100  09/23/20 (!) 144/96  08/12/20 (!) 142/102   Wt Readings from Last 3 Encounters:  12/30/20 291 lb 11.2 oz (132.3 kg)  09/23/20 (!) 304 lb (137.9 kg)  08/12/20 298 lb 4.8 oz (135.3 kg)   Physical Exam Vitals reviewed.  Constitutional:      General: She is not in acute distress.    Appearance: Normal appearance. She is well-developed. She is not diaphoretic.  HENT:     Head: Normocephalic and atraumatic.  Eyes:     General: No scleral icterus.    Conjunctiva/sclera: Conjunctivae normal.  Neck:     Thyroid: No thyromegaly.  Cardiovascular:     Rate and Rhythm: Normal rate and regular rhythm.     Pulses: Normal pulses.     Heart sounds: Normal heart sounds. No murmur heard. Pulmonary:     Effort: Pulmonary effort is normal. No respiratory distress.     Breath sounds: Normal breath sounds. No wheezing, rhonchi or rales.  Musculoskeletal:     Cervical back: Neck supple.     Right lower leg: No edema.     Left lower leg: No edema.  Lymphadenopathy:     Cervical: No cervical adenopathy.  Skin:    General: Skin is warm and dry.     Findings: No rash.  Neurological:     Mental Status: She is alert and oriented to person, place, and time. Mental status is at baseline.  Psychiatric:        Mood and Affect: Mood normal.        Behavior: Behavior normal.    No results found for any  visits on 12/30/20.  Assessment & Plan     Problem List Items Addressed This Visit       Cardiovascular and Mediastinum   Primary hypertension - Primary    Recent diagnosis Persistently elevated BP Discussed lifestyle interventions Start lisinopril 20 mg daily Discussed goal BP of <130/80 Repeat BMP at next visit        Relevant Medications   lisinopril (ZESTRIL) 20 MG tablet     Other   Morbid obesity ([place])    Congratulated on weight loss Discussed importance of healthy weight management Discussed diet and exercise         Return in about 3 months (around 04/01/2021) for BP f/u.  I,Essence Turner,acting as a Education administrator for Lavon Paganini, MD.,have documented all relevant documentation on the behalf of Lavon Paganini, MD,as directed by  Lavon Paganini, MD while in the presence of Lavon Paganini, MD.  I, Lavon Paganini, MD, have reviewed all documentation for this visit. The documentation on 12/30/20 for the exam, diagnosis, procedures, and orders are all accurate and complete.   Doc Mandala, Dionne Bucy, MD, MPH [facility]

## 2021-03-12 ENCOUNTER — Other Ambulatory Visit: Payer: Self-pay | Admitting: Family Medicine

## 2021-03-15 ENCOUNTER — Other Ambulatory Visit: Payer: Self-pay | Admitting: Family Medicine

## 2021-03-16 NOTE — Telephone Encounter (Signed)
Too soon - last RF 12/30/20 #30 3 RF- should have enough med to last until November

## 2021-04-02 ENCOUNTER — Ambulatory Visit: Payer: Self-pay | Admitting: Family Medicine

## 2021-04-07 ENCOUNTER — Encounter: Payer: Self-pay | Admitting: Family Medicine

## 2021-04-07 ENCOUNTER — Other Ambulatory Visit: Payer: Self-pay

## 2021-04-07 ENCOUNTER — Ambulatory Visit (INDEPENDENT_AMBULATORY_CARE_PROVIDER_SITE_OTHER): Payer: Medicaid Other | Admitting: Family Medicine

## 2021-04-07 VITALS — BP 117/81 | HR 80 | Temp 97.5°F | Ht 66.0 in | Wt 290.7 lb

## 2021-04-07 DIAGNOSIS — I10 Essential (primary) hypertension: Secondary | ICD-10-CM

## 2021-04-07 DIAGNOSIS — E782 Mixed hyperlipidemia: Secondary | ICD-10-CM | POA: Diagnosis not present

## 2021-04-07 DIAGNOSIS — R7303 Prediabetes: Secondary | ICD-10-CM | POA: Diagnosis not present

## 2021-04-07 DIAGNOSIS — Z23 Encounter for immunization: Secondary | ICD-10-CM | POA: Diagnosis not present

## 2021-04-07 DIAGNOSIS — E538 Deficiency of other specified B group vitamins: Secondary | ICD-10-CM

## 2021-04-07 DIAGNOSIS — E559 Vitamin D deficiency, unspecified: Secondary | ICD-10-CM

## 2021-04-07 MED ORDER — LOSARTAN POTASSIUM 50 MG PO TABS: 50.0000 mg | ORAL_TABLET | Freq: Every day | ORAL | 1 refills | Status: DC

## 2021-04-07 NOTE — Progress Notes (Signed)
Established patient visit   Patient: Kelsey Hughes   DOB: Jul 28, 1980   40 y.o. Female  MRN: [id number] Visit Date: 04/07/2021  Today's healthcare provider: Lavon Paganini, MD   Chief Complaint  Patient presents with   Hypertension   Subjective    HPI  -Patient reports feeling fatigue -Can she receive booster now? -Would like tdap and flu vaccine today -Patient reports she has developed a cough since beginning medication. Her throat becomes dry and itchy which causes her to constantly cough. Drinking fluids does not assist. Hypertension, follow-up  BP Readings from Last 3 Encounters:  04/07/21 117/81  12/30/20 (!) 132/100  09/23/20 (!) 144/96   Wt Readings from Last 3 Encounters:  04/07/21 290 lb 11.2 oz (131.9 kg)  12/30/20 291 lb 11.2 oz (132.3 kg)  09/23/20 (!) 304 lb (137.9 kg)     She was last seen for hypertension 3 months ago.  BP at that visit was 132/100. Management since that visit includes start lisinopril 20 mg daily.  She reports good compliance with treatment. She is having side effects. cough She is following a Regular diet. She is not exercising. She does smoke.  Use of agents associated with hypertension: none.   Outside blood pressures are none. Symptoms: No chest pain No chest pressure  No palpitations No syncope  No dyspnea No orthopnea  No paroxysmal nocturnal dyspnea No lower extremity edema   Pertinent labs: Lab Results  Component Value Date   CHOL 251 (H) 08/12/2020   HDL 46 08/12/2020   LDLCALC 162 (H) 08/12/2020   LDLDIRECT 130 (H) 09/18/2013   TRIG 232 (H) 08/12/2020   CHOLHDL 5.5 (H) 08/12/2020   Lab Results  Component Value Date   NA 142 08/12/2020   K 4.4 08/12/2020   CREATININE 0.78 08/12/2020   GFRNONAA 94 08/12/2020   GLUCOSE 118 (H) 08/12/2020     The 10-year ASCVD risk score (Arnett DK, et al., 2019) is: 1.6%    ---------------------------------------------------------------------------------------------------     Medications: Outpatient Medications Prior to Visit  Medication Sig   albuterol (VENTOLIN HFA) 108 (90 Base) MCG/ACT inhaler Inhale 2 puffs into the lungs every 6 (six) hours as needed for wheezing or shortness of breath.   cholecalciferol (VITAMIN D3) 25 MCG (1000 UT) tablet Take 1,000 Units by mouth daily.   clotrimazole-betamethasone (LOTRISONE) cream Apply 1 application topically 2 (two) times daily.   fluticasone (FLONASE) 50 MCG/ACT nasal spray Place 2 sprays into both nostrils daily.   Multiple Vitamin (MULTIVITAMIN) tablet Take 1 tablet by mouth daily.   Omega-3 Fatty Acids (FISH OIL PO) Take by mouth.   omeprazole (PRILOSEC) 20 MG capsule TAKE 1 CAPSULE BY MOUTH EVERY DAY   vitamin B-12 (CYANOCOBALAMIN) 1000 MCG tablet Take 1,000 mcg by mouth daily.   [DISCONTINUED] lisinopril (ZESTRIL) 20 MG tablet Take 1 tablet (20 mg total) by mouth daily.   No facility-administered medications prior to visit.    Review of Systems - per HPI  Last CBC Lab Results  Component Value Date   WBC 8.2 08/28/2018   HGB 13.5 08/28/2018   HCT 41.4 08/28/2018   MCV 94.3 08/28/2018   MCH 30.8 08/28/2018   RDW 13.7 08/28/2018   PLT 289 74/25/9563   Last metabolic panel Lab Results  Component Value Date   GLUCOSE 118 (H) 08/12/2020   NA 142 08/12/2020   K 4.4 08/12/2020   CL 103 08/12/2020   CO2 22 08/12/2020   BUN 13 08/12/2020  CREATININE 0.78 08/12/2020   GFRNONAA 94 08/12/2020   CALCIUM 8.9 08/12/2020   PROT 7.0 08/12/2020   ALBUMIN 4.0 08/12/2020   LABGLOB 3.0 08/12/2020   AGRATIO 1.3 08/12/2020   BILITOT <0.2 08/12/2020   ALKPHOS 76 08/12/2020   AST 20 08/12/2020   ALT 24 08/12/2020   ANIONGAP 9 08/28/2018   Last lipids Lab Results  Component Value Date   CHOL 251 (H) 08/12/2020   HDL 46 08/12/2020   LDLCALC 162 (H) 08/12/2020   LDLDIRECT 130 (H) 09/18/2013    TRIG 232 (H) 08/12/2020   CHOLHDL 5.5 (H) 08/12/2020   Last hemoglobin A1c Lab Results  Component Value Date   HGBA1C 6.1 (H) 08/12/2020   Last thyroid functions Lab Results  Component Value Date   TSH 1.470 11/29/2018   Last vitamin D Lab Results  Component Value Date   VD25OH 37.1 02/09/2019   Last vitamin B12 and Folate Lab Results  Component Value Date   VITAMINB12 614 02/09/2019       Objective    BP 117/81 (BP Location: Right Arm, Patient Position: Sitting, Cuff Size: Large)   Pulse 80   Temp (!) 97.5 F (36.4 C) (Temporal)   Ht 5\' 6"  (1.676 m)   Wt 290 lb 11.2 oz (131.9 kg)   SpO2 95%   BMI 46.92 kg/m  BP Readings from Last 3 Encounters:  04/07/21 117/81  12/30/20 (!) 132/100  09/23/20 (!) 144/96   Wt Readings from Last 3 Encounters:  04/07/21 290 lb 11.2 oz (131.9 kg)  12/30/20 291 lb 11.2 oz (132.3 kg)  09/23/20 (!) 304 lb (137.9 kg)      Physical Exam Vitals reviewed.  Constitutional:      General: She is not in acute distress.    Appearance: Normal appearance. She is well-developed. She is not diaphoretic.  HENT:     Head: Normocephalic and atraumatic.  Eyes:     General: No scleral icterus.    Conjunctiva/sclera: Conjunctivae normal.  Neck:     Thyroid: No thyromegaly.  Cardiovascular:     Rate and Rhythm: Normal rate and regular rhythm.     Pulses: Normal pulses.     Heart sounds: Normal heart sounds. No murmur heard. Pulmonary:     Effort: Pulmonary effort is normal. No respiratory distress.     Breath sounds: Normal breath sounds. No wheezing, rhonchi or rales.  Musculoskeletal:     Cervical back: Neck supple.     Right lower leg: No edema.     Left lower leg: No edema.  Lymphadenopathy:     Cervical: No cervical adenopathy.  Skin:    General: Skin is warm and dry.     Findings: No rash.  Neurological:     Mental Status: She is alert and oriented to person, place, and time. Mental status is at baseline.  Psychiatric:         Mood and Affect: Mood normal.        Behavior: Behavior normal.      No results found for any visits on 04/07/21.  Assessment & Plan     Problem List Items Addressed This Visit       Cardiovascular and Mediastinum   Primary hypertension - Primary    Well controlled Will change lisinopril to losartan due to cough Recheck metabolic panel F/u in 6 months       Relevant Medications   losartan (COZAAR) 50 MG tablet   Other Relevant Orders   Comprehensive  metabolic panel     Other   Morbid obesity ([place])    Discussed importance of healthy weight management Discussed diet and exercise       Prediabetes    Recommend low carb diet Recheck A1c       Relevant Orders   Hemoglobin A1c   B12 deficiency   Relevant Orders   B12   Avitaminosis D   Relevant Orders   VITAMIN D 25 Hydroxy (Vit-D Deficiency, Fractures)   Mixed hyperlipidemia    Reviewed last lipid panel Not currently on a statin Recheck FLP and CMP Discussed diet and exercise       Relevant Medications   losartan (COZAAR) 50 MG tablet   Other Relevant Orders   Lipid panel   Comprehensive metabolic panel     Return in about 6 months (around 10/06/2021) for CPE.      I, Lavon Paganini, MD, have reviewed all documentation for this visit. The documentation on 04/07/21 for the exam, diagnosis, procedures, and orders are all accurate and complete.   Genelda Roark, Dionne Bucy, MD, MPH [facility]

## 2021-04-07 NOTE — Assessment & Plan Note (Signed)
Discussed importance of healthy weight management Discussed diet and exercise  

## 2021-04-07 NOTE — Assessment & Plan Note (Signed)
Well controlled Will change lisinopril to losartan due to cough Recheck metabolic panel F/u in 6 months

## 2021-04-07 NOTE — Assessment & Plan Note (Signed)
Recommend low carb diet °Recheck A1c  °

## 2021-04-07 NOTE — Assessment & Plan Note (Signed)
Reviewed last lipid panel Not currently on a statin Recheck FLP and CMP Discussed diet and exercise  

## 2021-04-28 DIAGNOSIS — E559 Vitamin D deficiency, unspecified: Secondary | ICD-10-CM | POA: Diagnosis not present

## 2021-04-28 DIAGNOSIS — E538 Deficiency of other specified B group vitamins: Secondary | ICD-10-CM | POA: Diagnosis not present

## 2021-04-28 DIAGNOSIS — I10 Essential (primary) hypertension: Secondary | ICD-10-CM | POA: Diagnosis not present

## 2021-04-28 DIAGNOSIS — R7303 Prediabetes: Secondary | ICD-10-CM | POA: Diagnosis not present

## 2021-04-28 DIAGNOSIS — E782 Mixed hyperlipidemia: Secondary | ICD-10-CM | POA: Diagnosis not present

## 2021-04-29 LAB — HEMOGLOBIN A1C
Est. average glucose Bld gHb Est-mCnc: 123 mg/dL
Hgb A1c MFr Bld: 5.9 % — ABNORMAL HIGH (ref 4.8–5.6)

## 2021-04-29 LAB — LIPID PANEL
Chol/HDL Ratio: 4.6 ratio — ABNORMAL HIGH (ref 0.0–4.4)
Cholesterol, Total: 215 mg/dL — ABNORMAL HIGH (ref 100–199)
HDL: 47 mg/dL (ref >39)
LDL Chol Calc (NIH): 153 mg/dL — ABNORMAL HIGH (ref 0–99)
Triglycerides: 85 mg/dL (ref 0–149)
VLDL Cholesterol Cal: 15 mg/dL (ref 5–40)

## 2021-04-29 LAB — COMPREHENSIVE METABOLIC PANEL
ALT: 19 IU/L (ref 0–32)
AST: 16 IU/L (ref 0–40)
Albumin/Globulin Ratio: 1.3 (ref 1.2–2.2)
Albumin: 3.9 g/dL (ref 3.8–4.8)
Alkaline Phosphatase: 71 IU/L (ref 44–121)
BUN/Creatinine Ratio: 17 (ref 9–23)
BUN: 13 mg/dL (ref 6–24)
Bilirubin Total: 0.3 mg/dL (ref 0.0–1.2)
CO2: 23 mmol/L (ref 20–29)
Calcium: 8.8 mg/dL (ref 8.7–10.2)
Chloride: 103 mmol/L (ref 96–106)
Creatinine, Ser: 0.76 mg/dL (ref 0.57–1.00)
Globulin, Total: 2.9 g/dL (ref 1.5–4.5)
Glucose: 89 mg/dL (ref 70–99)
Potassium: 4.2 mmol/L (ref 3.5–5.2)
Sodium: 139 mmol/L (ref 134–144)
Total Protein: 6.8 g/dL (ref 6.0–8.5)
eGFR: 100 mL/min/{1.73_m2} (ref >59)

## 2021-04-29 LAB — VITAMIN D 25 HYDROXY (VIT D DEFICIENCY, FRACTURES): Vit D, 25-Hydroxy: 42.7 ng/mL (ref 30.0–100.0)

## 2021-04-29 LAB — VITAMIN B12: Vitamin B-12: 346 pg/mL (ref 232–1245)

## 2021-06-10 ENCOUNTER — Other Ambulatory Visit: Payer: Self-pay | Admitting: Family Medicine

## 2021-06-10 NOTE — Telephone Encounter (Signed)
Requested Prescriptions  Pending Prescriptions Disp Refills   omeprazole (PRILOSEC) 20 MG capsule [Pharmacy Med Name: OMEPRAZOLE DR 20 MG CAPSULE] 90 capsule 0    Sig: TAKE 1 CAPSULE BY MOUTH EVERY DAY     Gastroenterology: Proton Pump Inhibitors Passed - 06/10/2021  1:11 AM      Passed - Valid encounter within last 12 months    Recent Outpatient Visits          2 months ago Primary hypertension   [facility] [place], Dionne Bucy, MD   5 months ago Primary hypertension   [facility], Dionne Bucy, MD   8 months ago Primary hypertension   [facility], Dionne Bucy, MD   10 months ago Encounter for annual physical exam   [facility], Dionne Bucy, MD   10 months ago [place], Wendee Beavers, [place]      Future Appointments            In 3 months [place], Dionne Bucy, MD [facility], [place]

## 2021-08-03 ENCOUNTER — Other Ambulatory Visit (HOSPITAL_COMMUNITY)
Admission: RE | Admit: 2021-08-03 | Discharge: 2021-08-03 | Disposition: A | Discharge status: Home / Self Care | Payer: Medicaid Other | Source: Ambulatory Visit | Attending: Family Medicine | Admitting: Family Medicine

## 2021-08-03 ENCOUNTER — Ambulatory Visit: Payer: Medicaid Other | Admitting: Family Medicine

## 2021-08-03 ENCOUNTER — Encounter: Payer: Self-pay | Admitting: Family Medicine

## 2021-08-03 ENCOUNTER — Other Ambulatory Visit: Payer: Self-pay

## 2021-08-03 VITALS — BP 132/89 | HR 101 | Temp 97.9°F | Resp 16 | Wt 283.2 lb

## 2021-08-03 DIAGNOSIS — Z20828 Contact with and (suspected) exposure to other viral communicable diseases: Secondary | ICD-10-CM

## 2021-08-03 DIAGNOSIS — H6502 Acute serous otitis media, left ear: Secondary | ICD-10-CM

## 2021-08-03 DIAGNOSIS — N898 Other specified noninflammatory disorders of vagina: Secondary | ICD-10-CM

## 2021-08-03 DIAGNOSIS — Z113 Encounter for screening for infections with a predominantly sexual mode of transmission: Secondary | ICD-10-CM | POA: Diagnosis not present

## 2021-08-03 LAB — POCT URINALYSIS DIPSTICK
Bilirubin, UA: NEGATIVE
Blood, UA: NEGATIVE
Glucose, UA: NEGATIVE
Ketones, UA: NEGATIVE
Nitrite, UA: NEGATIVE
Protein, UA: NEGATIVE
Spec Grav, UA: 1.025 (ref 1.010–1.025)
Urobilinogen, UA: 0.2 E.U./dL
pH, UA: 6 (ref 5.0–8.0)

## 2021-08-03 MED ORDER — AMOXICILLIN-POT CLAVULANATE 875-125 MG PO TABS: 1.0000 | ORAL_TABLET | Freq: Two times a day (BID) | ORAL | 0 refills | Status: AC

## 2021-08-03 NOTE — Progress Notes (Signed)
Established patient visit   Patient: Kelsey Hughes   DOB: 08/11/1980   41 y.o. Female  MRN: [id number] Visit Date: 08/03/2021  Today's healthcare provider: Lavon Paganini, MD   Chief Complaint  Patient presents with   Urinary Tract Infection   I,Sulibeya S Dimas,acting as a scribe for Lavon Paganini, MD.,have documented all relevant documentation on the behalf of Lavon Paganini, MD,as directed by  Lavon Paganini, MD while in the presence of Lavon Paganini, MD.  Subjective     Urinary symptoms/vaginal discharge  She reports new onset cloudy malodorous urine. The current episode started a few days ago and is staying constant. Patient states symptoms are mild in intensity, occurring constantly. She  has not been recently treated for similar symptoms.    Associated symptoms: No abdominal pain No back pain  No chills No constipation  No cramping No diarrhea  Yes discharge No fever  No hematuria No nausea  No vomiting     +vaginal discharge and fishy odor. No abnormal bleeding, abd pain, fevers, rash. Sexually active with 1 female partner (but concerned he has other partners). No condom use. Last sexually active with him 2 days ago and symptoms started 1 wk ago Saw greenish crust/discharge on his penis  L ear pain x2-3 days, recent sick contacts. ---------------------------------------------------------------------------------------   Medications: Outpatient Medications Prior to Visit  Medication Sig   albuterol (VENTOLIN HFA) 108 (90 Base) MCG/ACT inhaler Inhale 2 puffs into the lungs every 6 (six) hours as needed for wheezing or shortness of breath.   cholecalciferol (VITAMIN D3) 25 MCG (1000 UT) tablet Take 1,000 Units by mouth daily.   clotrimazole-betamethasone (LOTRISONE) cream Apply 1 application topically 2 (two) times daily.   fluticasone (FLONASE) 50 MCG/ACT nasal spray Place 2 sprays into both nostrils daily.   losartan (COZAAR) 50 MG tablet Take 1  tablet (50 mg total) by mouth daily.   Multiple Vitamin (MULTIVITAMIN) tablet Take 1 tablet by mouth daily.   Omega-3 Fatty Acids (FISH OIL PO) Take by mouth.   omeprazole (PRILOSEC) 20 MG capsule TAKE 1 CAPSULE BY MOUTH EVERY DAY   vitamin B-12 (CYANOCOBALAMIN) 1000 MCG tablet Take 1,000 mcg by mouth daily.   No facility-administered medications prior to visit.    Review of Systems  Constitutional:  Negative for appetite change, chills, fatigue and fever.  Respiratory:  Negative for chest tightness and shortness of breath.   Cardiovascular:  Negative for chest pain and palpitations.  Genitourinary:  Positive for vaginal discharge. Negative for dysuria, flank pain, frequency, genital sores, hematuria and urgency.       Objective    BP 132/89 (BP Location: Left Arm, Patient Position: Sitting, Cuff Size: Large)    Pulse (!) 101    Temp 97.9 F (36.6 C) (Temporal)    Resp 16    Wt 283 lb 3.2 oz (128.5 kg)    LMP 07/22/2021 (Exact Date)    BMI 45.71 kg/m  BP Readings from Last 3 Encounters:  08/03/21 132/89  04/07/21 117/81  12/30/20 (!) 132/100   Wt Readings from Last 3 Encounters:  08/03/21 283 lb 3.2 oz (128.5 kg)  04/07/21 290 lb 11.2 oz (131.9 kg)  12/30/20 291 lb 11.2 oz (132.3 kg)      Physical Exam Vitals reviewed.  Constitutional:      General: She is not in acute distress.    Appearance: She is well-developed.  HENT:     Head: Normocephalic and atraumatic.  Left Ear: Ear canal and external ear normal.     Ears:     Comments: L TM bulging and erythematous Eyes:     General: No scleral icterus.    Conjunctiva/sclera: Conjunctivae normal.  Cardiovascular:     Rate and Rhythm: Normal rate and regular rhythm.  Pulmonary:     Effort: Pulmonary effort is normal. No respiratory distress.  Genitourinary:    Comments: GYN:  External genitalia within normal limits.  Vaginal mucosa pink, moist, normal rugae.  Nonfriable cervix without lesions, no bleeding, + malodorous  discharge noted on speculum exam.  Skin:    General: Skin is warm and dry.     Findings: No rash.  Neurological:     Mental Status: She is alert and oriented to person, place, and time.  Psychiatric:        Behavior: Behavior normal.      Results for orders placed or performed in visit on 08/03/21  POCT urinalysis dipstick  Result Value Ref Range   Color, UA yellow    Clarity, UA cloudy    Glucose, UA Negative Negative   Bilirubin, UA Negative    Ketones, UA Negative    Spec Grav, UA 1.025 1.010 - 1.025   Blood, UA Negative    pH, UA 6.0 5.0 - 8.0   Protein, UA Negative Negative   Urobilinogen, UA 0.2 0.2 or 1.0 E.U./dL   Nitrite, UA Negative    Leukocytes, UA Trace (A) Negative    Assessment & Plan     1. Vaginal discharge 2. Screen for STD (sexually transmitted disease) 3. Contact with and (suspected) exposure to other viral communicable diseases - possible STD exposure with new discharge - no UTI signs/symptoms - suspect that trace leuks on UA are from vaginal discharge - check for NG/CT, Trich, BV/yeast - HIV and RPR screening - treatment pending results - discussed protected sex - HIV antibody (with reflex) - RPR  - Cervicovaginal ancillary only  4. Non-recurrent acute serous otitis media of left ear - symptoms and exam c/w L AOM without rupture - no evidence of sinusitis, CAP, strep pharyngitis, or other infection - will treat with Augmentin x7d - discussed symptomatic management (flonase, tylenol, etc), natural course, and return precautions      Return if symptoms worsen or fail to improve.      I, Lavon Paganini, MD, have reviewed all documentation for this visit. The documentation on 08/03/21 for the exam, diagnosis, procedures, and orders are all accurate and complete.   Johnanna Bakke, Dionne Bucy, MD, MPH [facility]

## 2021-08-04 LAB — CERVICOVAGINAL ANCILLARY ONLY
Bacterial Vaginitis (gardnerella): POSITIVE — AB
Candida Glabrata: NEGATIVE
Candida Vaginitis: NEGATIVE
Chlamydia: NEGATIVE
Comment: NEGATIVE
Comment: NEGATIVE
Comment: NEGATIVE
Comment: NEGATIVE
Comment: NEGATIVE
Comment: NORMAL
Neisseria Gonorrhea: NEGATIVE
Trichomonas: NEGATIVE

## 2021-08-04 LAB — RPR: RPR Ser Ql: NONREACTIVE

## 2021-08-04 LAB — HIV ANTIBODY (ROUTINE TESTING W REFLEX): HIV Screen 4th Generation wRfx: NONREACTIVE

## 2021-08-31 ENCOUNTER — Ambulatory Visit: Payer: Medicaid Other | Admitting: Family Medicine

## 2021-09-01 ENCOUNTER — Other Ambulatory Visit: Payer: Self-pay

## 2021-09-01 ENCOUNTER — Ambulatory Visit (INDEPENDENT_AMBULATORY_CARE_PROVIDER_SITE_OTHER): Payer: Medicaid Other | Admitting: Physician Assistant

## 2021-09-01 ENCOUNTER — Encounter: Payer: Self-pay | Admitting: Physician Assistant

## 2021-09-01 VITALS — BP 140/83 | HR 76 | Temp 97.9°F | Resp 14 | Ht 66.0 in | Wt 286.0 lb

## 2021-09-01 DIAGNOSIS — J069 Acute upper respiratory infection, unspecified: Secondary | ICD-10-CM | POA: Diagnosis not present

## 2021-09-01 MED ORDER — AZELASTINE HCL 0.1 % NA SOLN: 2.0000 | Freq: Two times a day (BID) | NASAL | 3 refills | Status: DC

## 2021-09-01 NOTE — Progress Notes (Signed)
? ?Jim Like as a Education administrator for [employer], PA-C.,have documented all relevant documentation on the behalf of Mikey Kirschner, PA-C,as directed by  Mikey Kirschner, PA-C while in the presence of Mikey Kirschner, PA-C.  ?Acute Office Visit ? ?Subjective:  ? ? Patient ID: Kelsey Hughes, female    DOB: 20-Jun-1981, 41 y.o.   MRN: [id number] ? ?Cc. Bilateral ear pain, cough ? ? ?HPI ? ?Kelsey Hughes is a 41 y/o female who presents today with concerns over continued ear pain, sinus congestion, cough. Reports that in early February she saw Dr B with concerns over L ear pain, was rx Augmentin, which she finished, but took sporadically. Now presents with bilateral ear pain. Denies fevers, headache, chest congestion, shortness of breath.  ? ?Past Medical History:  ?Diagnosis Date  ? Allergy   ? Anemia   ? Cutaneous skin tags   ? Infertility, female   ? ? ?Past Surgical History:  ?Procedure Laterality Date  ? ANTERIOR CRUCIATE LIGAMENT REPAIR    ? DILATION AND CURETTAGE, DIAGNOSTIC / THERAPEUTIC    ? ? ?Family History  ?Problem Relation Age of Onset  ? Hypertension Father   ? Heart disease Father   ? Stroke Father   ? Hypertension Mother   ? Atrial fibrillation Mother   ? Sleep apnea Mother   ? Diabetes Maternal Aunt   ? Diabetes Maternal Uncle   ? Colon cancer Neg Hx   ? Breast cancer Neg Hx   ? ? ?Social History  ? ?Socioeconomic History  ? Marital status: Single  ?  Spouse name: Not on file  ? Number of children: Not on file  ? Years of education: Not on file  ? Highest education level: Not on file  ?Occupational History  ? Occupation: Unemployed  ?Tobacco Use  ? Smoking status: Never  ? Smokeless tobacco: Never  ?Vaping Use  ? Vaping Use: Never used  ?Substance and Sexual Activity  ? Alcohol use: Yes  ?  Comment: occasional, not every week  ? Drug use: No  ? Sexual activity: Yes  ?  Partners: Male  ?  Birth control/protection: None  ?Other Topics Concern  ? Not on file  ?Social History Narrative  ? Not on file  ? ?Social  Determinants of Health  ? ?Financial Resource Strain: Not on file  ?Food Insecurity: Not on file  ?Transportation Needs: Not on file  ?Physical Activity: Not on file  ?Stress: Not on file  ?Social Connections: Not on file  ?Intimate Partner Violence: Not on file  ? ? ?Outpatient Medications Prior to Visit  ?Medication Sig Dispense Refill  ? albuterol (VENTOLIN HFA) 108 (90 Base) MCG/ACT inhaler Inhale 2 puffs into the lungs every 6 (six) hours as needed for wheezing or shortness of breath. 1 each 2  ? cholecalciferol (VITAMIN D3) 25 MCG (1000 UT) tablet Take 1,000 Units by mouth daily.    ? clotrimazole-betamethasone (LOTRISONE) cream Apply 1 application topically 2 (two) times daily. 45 g 1  ? fluticasone (FLONASE) 50 MCG/ACT nasal spray Place 2 sprays into both nostrils daily. 16 g 6  ? losartan (COZAAR) 50 MG tablet Take 1 tablet (50 mg total) by mouth daily. 90 tablet 1  ? Multiple Vitamin (MULTIVITAMIN) tablet Take 1 tablet by mouth daily.    ? Omega-3 Fatty Acids (FISH OIL PO) Take by mouth.    ? omeprazole (PRILOSEC) 20 MG capsule TAKE 1 CAPSULE BY MOUTH EVERY DAY 90 capsule 0  ? vitamin B-12 (CYANOCOBALAMIN) 1000  MCG tablet Take 1,000 mcg by mouth daily.    ? ?No facility-administered medications prior to visit.  ? ? ?Allergies  ?Allergen Reactions  ? Lisinopril Cough  ? Sulfonamide Derivatives   ?  REACTION: ??? reaction during childhood  ? ? ?Review of Systems  ?HENT:  Positive for ear pain.   ?Respiratory:  Positive for cough.   ?All other systems reviewed and are negative. ? ?   ?Objective:  ?  ?Physical Exam ?Constitutional:   ?   General: She is awake.  ?   Appearance: She is well-developed.  ?HENT:  ?   Head: Normocephalic.  ?   Right Ear: A middle ear effusion is present. Tympanic membrane is not injected or erythematous.  ?   Left Ear: A middle ear effusion is present. Tympanic membrane is not injected or erythematous.  ?Eyes:  ?   Conjunctiva/sclera: Conjunctivae normal.  ?Cardiovascular:  ?   Rate  and Rhythm: Normal rate and regular rhythm.  ?   Heart sounds: Normal heart sounds.  ?Pulmonary:  ?   Effort: Pulmonary effort is normal.  ?   Breath sounds: Normal breath sounds.  ?Skin: ?   General: Skin is warm.  ?Neurological:  ?   Mental Status: She is alert and oriented to person, place, and time.  ?Psychiatric:     ?   Attention and Perception: Attention normal.     ?   Mood and Affect: Mood normal.     ?   Speech: Speech normal.     ?   Behavior: Behavior is cooperative.  ? ? ?There were no vitals taken for this visit. ?Wt Readings from Last 3 Encounters:  ?08/03/21 283 lb 3.2 oz (128.5 kg)  ?04/07/21 290 lb 11.2 oz (131.9 kg)  ?12/30/20 291 lb 11.2 oz (132.3 kg)  ? ? ?Health Maintenance Due  ?Topic Date Due  ? TETANUS/TDAP  Never done  ? COVID-19 Vaccine (3 - Booster for Pfizer series) 01/15/2020  ? ? ?There are no preventive care reminders to display for this patient. ? ? ?Lab Results  ?Component Value Date  ? TSH 1.470 11/29/2018  ? ?Lab Results  ?Component Value Date  ? WBC 8.2 08/28/2018  ? HGB 13.5 08/28/2018  ? HCT 41.4 08/28/2018  ? MCV 94.3 08/28/2018  ? PLT 289 08/28/2018  ? ?Lab Results  ?Component Value Date  ? NA 139 04/28/2021  ? K 4.2 04/28/2021  ? CO2 23 04/28/2021  ? GLUCOSE 89 04/28/2021  ? BUN 13 04/28/2021  ? CREATININE 0.76 04/28/2021  ? BILITOT 0.3 04/28/2021  ? ALKPHOS 71 04/28/2021  ? AST 16 04/28/2021  ? ALT 19 04/28/2021  ? PROT 6.8 04/28/2021  ? ALBUMIN 3.9 04/28/2021  ? CALCIUM 8.8 04/28/2021  ? ANIONGAP 9 08/28/2018  ? EGFR 100 04/28/2021  ? ?Lab Results  ?Component Value Date  ? CHOL 215 (H) 04/28/2021  ? ?Lab Results  ?Component Value Date  ? HDL 47 04/28/2021  ? ?Lab Results  ?Component Value Date  ? LDLCALC 153 (H) 04/28/2021  ? ?Lab Results  ?Component Value Date  ? TRIG 85 04/28/2021  ? ?Lab Results  ?Component Value Date  ? CHOLHDL 4.6 (H) 04/28/2021  ? ?Lab Results  ?Component Value Date  ? HGBA1C 5.9 (H) 04/28/2021  ? ? ?   ?Assessment & Plan:  ? ?Acute URI ?Serous OM  without injection or erythema.  ?Rx azelastine nasal spray, advised in addition to flonase, can alternate one in AM  and one in PM.  ?Advised saline nasal rinses as well ?Can continue with OTC nasal congestant or antihistamine ? ?I, Mikey Kirschner, PA-C have reviewed all documentation for this visit. The documentation on  09/01/2021 for the exam, diagnosis, procedures, and orders are all accurate and complete. ? ?Mikey Kirschner, PA-C ?[facility] ?[address] ?[place], [place], [address] ?Office: [phone] ?Fax: [phone]  ? ?

## 2021-09-12 ENCOUNTER — Other Ambulatory Visit: Payer: Self-pay | Admitting: Family Medicine

## 2021-09-14 ENCOUNTER — Other Ambulatory Visit: Payer: Self-pay

## 2021-09-14 ENCOUNTER — Ambulatory Visit
Admission: RE | Admit: 2021-09-14 | Discharge: 2021-09-14 | Disposition: A | Discharge status: Home / Self Care | Payer: Medicaid Other | Source: Ambulatory Visit | Attending: Physician Assistant | Admitting: Physician Assistant

## 2021-09-14 ENCOUNTER — Ambulatory Visit
Admission: RE | Admit: 2021-09-14 | Discharge: 2021-09-14 | Disposition: A | Discharge status: Home / Self Care | Payer: Medicaid Other | Attending: Physician Assistant | Admitting: Physician Assistant

## 2021-09-14 ENCOUNTER — Ambulatory Visit (INDEPENDENT_AMBULATORY_CARE_PROVIDER_SITE_OTHER): Payer: Medicaid Other | Admitting: Physician Assistant

## 2021-09-14 ENCOUNTER — Encounter: Payer: Self-pay | Admitting: Physician Assistant

## 2021-09-14 VITALS — BP 139/88 | HR 88 | Temp 98.2°F | Resp 14 | Wt 288.8 lb

## 2021-09-14 DIAGNOSIS — R062 Wheezing: Secondary | ICD-10-CM | POA: Diagnosis not present

## 2021-09-14 DIAGNOSIS — R059 Cough, unspecified: Secondary | ICD-10-CM | POA: Insufficient documentation

## 2021-09-14 DIAGNOSIS — J302 Other seasonal allergic rhinitis: Secondary | ICD-10-CM

## 2021-09-14 MED ORDER — IPRATROPIUM BROMIDE 0.03 % NA SOLN: 2.0000 | Freq: Two times a day (BID) | NASAL | 12 refills | Status: AC

## 2021-09-14 MED ORDER — IPRATROPIUM BROMIDE 0.02 % IN SOLN: 0.5000 mg | Freq: Four times a day (QID) | RESPIRATORY_TRACT | 12 refills | Status: DC

## 2021-09-14 NOTE — Progress Notes (Signed)
?  ? ? ?I,Sakura Denis Robinson,acting as a Education administrator for [employer], PA-C.,have documented all relevant documentation on the behalf of Mardene Speak, PA-C,as directed by  [employer], PA-C while in the presence of [employer], PA-C.  ? ?Established patient visit ? ? ?Patient: Kelsey Hughes   DOB: 04-Jan-1981   41 y.o. Female  MRN: [id number] ?Visit Date: 09/14/2021 ? ?Today's healthcare provider: Mardene Speak, PA-C  ?CC: cough ? ?Subjective  ?  ? ? ?Cough ?This is a new problem. The current episode started 4 weeks ago (at least 1 month). The problem has been gradually worsening. The problem occurs every few minutes. Cough characteristics: sometimes production/cometimes not. Associated symptoms include headaches, nasal congestion, shortness of breath and wheezing. Pertinent negatives include no chest pain, chills, ear congestion, ear pain, fever, heartburn, hemoptysis, myalgias, postnasal drip, rash, rhinorrhea, sore throat, sweats or weight loss. Associated symptoms comments: Chest heaviness . The symptoms are aggravated by lying down. Treatments tried: sudafed or mucinux q 4 hrs. The treatment provided mild relief. Her past medical history is significant for emphysema. There is no history of asthma, bronchiectasis, bronchitis, COPD, environmental allergies or pneumonia.  ? ?Her mother and daughter have similar symptoms . ?Daughter is doing better and mother has a"lingering "cough ? ?She endorses chronic SOB with claiming steps and walking. It got worse with current cough. Denies having GERD or indigestion. ? ?Not a smoker ?Has seasonal allergies ? ?Was seen by Darene Lamer, PAC on 09/01/2021 for serous OM without injection, was prescribed azelastine nasal spray and saline nasal rinses ? ?Was seen by Dr. Brita Romp on 08/03/2021 for UTI and nonrecurrent acute serous otitis media of the left ear.  She had course of augumentin for 7 days as well as symptomatic management ? ?Medications: ?Outpatient Medications Prior to  Visit  ?Medication Sig  ? albuterol (VENTOLIN HFA) 108 (90 Base) MCG/ACT inhaler Inhale 2 puffs into the lungs every 6 (six) hours as needed for wheezing or shortness of breath.  ? azelastine (ASTELIN) 0.1 % nasal spray Place 2 sprays into both nostrils 2 (two) times daily. Use in each nostril as directed  ? cholecalciferol (VITAMIN D3) 25 MCG (1000 UT) tablet Take 1,000 Units by mouth daily.  ? clotrimazole-betamethasone (LOTRISONE) cream Apply 1 application topically 2 (two) times daily.  ? fluticasone (FLONASE) 50 MCG/ACT nasal spray Place 2 sprays into both nostrils daily.  ? losartan (COZAAR) 50 MG tablet Take 1 tablet (50 mg total) by mouth daily.  ? Multiple Vitamin (MULTIVITAMIN) tablet Take 1 tablet by mouth daily.  ? Omega-3 Fatty Acids (FISH OIL PO) Take by mouth.  ? omeprazole (PRILOSEC) 20 MG capsule TAKE 1 CAPSULE BY MOUTH EVERY DAY  ? vitamin B-12 (CYANOCOBALAMIN) 1000 MCG tablet Take 1,000 mcg by mouth daily.  ? ?No facility-administered medications prior to visit.  ? ? ?Review of Systems  ?Constitutional:  Negative for chills and fever.  ?HENT:  Negative for ear pain, postnasal drip, rhinorrhea and sore throat.   ?Respiratory:  Positive for cough, shortness of breath and wheezing.   ?Cardiovascular:  Negative for chest pain.  ?Musculoskeletal:  Negative for myalgias.  ?Skin:  Negative for rash.  ?Allergic/Immunologic: Negative for environmental allergies.  ?Neurological:  Positive for headaches.  ?See hpi ? ?  Objective  ?  ?BP 139/88 (BP Location: Right Arm, Patient Position: Sitting, Cuff Size: Large)   Pulse 88   Temp 98.2 ?F (36.8 ?C) (Oral)   Resp 14   Wt 288 lb 12.8 oz (  131 kg)   SpO2 99%   BMI 46.61 kg/m?  ? ? ?Physical Exam ?Vitals and nursing note reviewed.  ?Constitutional:   ?   Appearance: Normal appearance.  ?HENT:  ?   Right Ear: Tympanic membrane normal.  ?   Left Ear: Tympanic membrane normal.  ?   Nose: Congestion and rhinorrhea present.  ?   Mouth/Throat:  ?   Mouth: Mucous  membranes are moist.  ?   Pharynx: No oropharyngeal exudate or posterior oropharyngeal erythema.  ?Eyes:  ?   Pupils: Pupils are equal, round, and reactive to light.  ?Cardiovascular:  ?   Rate and Rhythm: Normal rate and regular rhythm.  ?   Pulses: Normal pulses.  ?Pulmonary:  ?   Effort: Pulmonary effort is normal.  ?   Breath sounds: Normal breath sounds.  ?Skin: ?   Findings: No rash.  ?Neurological:  ?   General: No focal deficit present.  ?   Mental Status: She is alert and oriented to person, place, and time.  ?Psychiatric:     ?   Behavior: Behavior normal.     ?   Thought Content: Thought content normal.     ?   Judgment: Judgment normal.  ?  ? ? ?No results found for any visits on 09/14/21. ? Assessment & Plan  ?  ? ?1. Cough in adult patient ?>8 weeks, suspected UACS 2/2 seasonal allergy or postviral cough ?- DG Chest 2 View; Future ?- CBC w/Diff/Platelet. Lab technician is not a ?- ipratropium (ATROVENT) 0.03 % nasal spray; Place 2 sprays into both nostrils every 12 (twelve) hours.  Dispense: 30 mL; Refill: 12 ?- Trial of azelastine with ipratropium sprays as well as azelastine with steroid sprays. ? ?2. Seasonal allergy/OM with effusion ?- Continue on OTC antihistamines and Flonase ? ?3. Morbid obesity ([place]) ?BMI 46.61 ?  Lifestyle modification suggested: weight loss, diet and exercise ? ?FU in 1 week. Might consider a referral for Pulmonology ?The patient was advised to call back or seek an in-person evaluation if the symptoms worsen or if the condition fails to improve as anticipated. ? ?I discussed the assessment and treatment plan with the patient. The patient was provided an opportunity to ask questions and all were answered. The patient agreed with the plan and demonstrated an understanding of the instructions. ? ?The entirety of the information documented in the History of Present Illness, Review of Systems and Physical Exam were personally obtained by me. Portions of this information were  initially documented by the CMA and reviewed by me for thoroughness and accuracy.   ? ?Mardene Speak, PA-C  ?[facility] ?[phone] (phone) ?[phone] (fax) ? ?[facility] ?

## 2021-09-22 ENCOUNTER — Ambulatory Visit: Payer: Medicaid Other | Admitting: Physician Assistant

## 2021-09-22 ENCOUNTER — Encounter: Payer: Self-pay | Admitting: Physician Assistant

## 2021-09-22 ENCOUNTER — Ambulatory Visit (INDEPENDENT_AMBULATORY_CARE_PROVIDER_SITE_OTHER): Payer: Medicaid Other | Admitting: Physician Assistant

## 2021-09-22 VITALS — BP 122/93 | HR 79 | Ht 67.0 in | Wt 285.5 lb

## 2021-09-22 DIAGNOSIS — J209 Acute bronchitis, unspecified: Secondary | ICD-10-CM

## 2021-09-22 DIAGNOSIS — J302 Other seasonal allergic rhinitis: Secondary | ICD-10-CM | POA: Diagnosis not present

## 2021-09-22 MED ORDER — LORATADINE 10 MG PO TABS: 10.0000 mg | ORAL_TABLET | Freq: Every day | ORAL | 1 refills | Status: AC

## 2021-09-22 MED ORDER — PREDNISONE 20 MG PO TABS: 20.0000 mg | ORAL_TABLET | Freq: Every day | ORAL | 0 refills | Status: DC

## 2021-09-22 NOTE — Progress Notes (Signed)
? ?Established Patient Office Visit ? ?Subjective:  ?Patient ID: Kelsey Hughes, female    DOB: 12-03-1980  Age: 41 y.o. MRN: [id number] ? ?CC: persistent cough x 2 months ? ?Kelsey Hughes is a 41 y/o female with concerns over a persistent cough she has been seen for multiple times. Reports sinus pressure, eye swelling, L ear pain/pressure. Denies fever/chills/body aches. Reports overall improvement but slowly. Uses her albuterol inhaler several times a week. Denies history of asthma or allergies.  ?Last CXR 09/14/2021 WNL no sign of pneumonia or other cardiopulmonary disease. ? ? ?Past Medical History:  ?Diagnosis Date  ? Allergy   ? Anemia   ? Cutaneous skin tags   ? Infertility, female   ? ? ?Past Surgical History:  ?Procedure Laterality Date  ? ANTERIOR CRUCIATE LIGAMENT REPAIR    ? DILATION AND CURETTAGE, DIAGNOSTIC / THERAPEUTIC    ? ? ?Family History  ?Problem Relation Age of Onset  ? Hypertension Father   ? Heart disease Father   ? Stroke Father   ? Hypertension Mother   ? Atrial fibrillation Mother   ? Sleep apnea Mother   ? Diabetes Maternal Aunt   ? Diabetes Maternal Uncle   ? Colon cancer Neg Hx   ? Breast cancer Neg Hx   ? ? ?Social History  ? ?Socioeconomic History  ? Marital status: Single  ?  Spouse name: Not on file  ? Number of children: Not on file  ? Years of education: Not on file  ? Highest education level: Not on file  ?Occupational History  ? Occupation: Unemployed  ?Tobacco Use  ? Smoking status: Never  ? Smokeless tobacco: Never  ?Vaping Use  ? Vaping Use: Never used  ?Substance and Sexual Activity  ? Alcohol use: Yes  ?  Comment: occasional, not every week  ? Drug use: No  ? Sexual activity: Yes  ?  Partners: Male  ?  Birth control/protection: None  ?Other Topics Concern  ? Not on file  ?Social History Narrative  ? Not on file  ? ?Social Determinants of Health  ? ?Financial Resource Strain: Not on file  ?Food Insecurity: Not on file  ?Transportation Needs: Not on file  ?Physical Activity: Not on file   ?Stress: Not on file  ?Social Connections: Not on file  ?Intimate Partner Violence: Not on file  ? ? ?Outpatient Medications Prior to Visit  ?Medication Sig Dispense Refill  ? albuterol (VENTOLIN HFA) 108 (90 Base) MCG/ACT inhaler Inhale 2 puffs into the lungs every 6 (six) hours as needed for wheezing or shortness of breath. 1 each 2  ? azelastine (ASTELIN) 0.1 % nasal spray Place 2 sprays into both nostrils 2 (two) times daily. Use in each nostril as directed 30 mL 3  ? cholecalciferol (VITAMIN D3) 25 MCG (1000 UT) tablet Take 1,000 Units by mouth daily.    ? clotrimazole-betamethasone (LOTRISONE) cream Apply 1 application topically 2 (two) times daily. 45 g 1  ? fluticasone (FLONASE) 50 MCG/ACT nasal spray Place 2 sprays into both nostrils daily. 16 g 6  ? ipratropium (ATROVENT) 0.03 % nasal spray Place 2 sprays into both nostrils every 12 (twelve) hours. 30 mL 12  ? losartan (COZAAR) 50 MG tablet Take 1 tablet (50 mg total) by mouth daily. 90 tablet 1  ? Multiple Vitamin (MULTIVITAMIN) tablet Take 1 tablet by mouth daily.    ? Omega-3 Fatty Acids (FISH OIL PO) Take by mouth.    ? omeprazole (PRILOSEC) 20 MG capsule  TAKE 1 CAPSULE BY MOUTH EVERY DAY 90 capsule 0  ? vitamin B-12 (CYANOCOBALAMIN) 1000 MCG tablet Take 1,000 mcg by mouth daily.    ? ?No facility-administered medications prior to visit.  ? ? ?Allergies  ?Allergen Reactions  ? Lisinopril Cough  ? Sulfonamide Derivatives   ?  REACTION: ??? reaction during childhood  ? ? ?ROS ?Review of Systems  ?Constitutional:  Positive for fatigue. Negative for fever.  ?HENT:  Positive for congestion, ear pain, postnasal drip, rhinorrhea, sinus pressure and sinus pain.   ?Respiratory:  Positive for cough, shortness of breath and wheezing.   ?Cardiovascular:  Negative for chest pain and leg swelling.  ?Gastrointestinal:  Negative for abdominal pain.  ?Neurological:  Negative for dizziness and headaches.  ? ?  ?Objective:  ?  ?Physical Exam ?Constitutional:   ?    Appearance: Normal appearance. She is not ill-appearing.  ?HENT:  ?   Head: Normocephalic.  ?   Right Ear: Tympanic membrane normal.  ?   Left Ear: A middle ear effusion is present.  ?   Nose: Congestion and rhinorrhea present.  ?   Mouth/Throat:  ?   Pharynx: Posterior oropharyngeal erythema present. No oropharyngeal exudate.  ?Eyes:  ?   Conjunctiva/sclera: Conjunctivae normal.  ?Cardiovascular:  ?   Rate and Rhythm: Normal rate and regular rhythm.  ?   Pulses: Normal pulses.  ?   Heart sounds: Normal heart sounds.  ?Pulmonary:  ?   Effort: Pulmonary effort is normal.  ?   Breath sounds: Normal breath sounds. No stridor. No wheezing, rhonchi or rales.  ?Musculoskeletal:  ?   Right lower leg: No edema.  ?   Left lower leg: No edema.  ?Neurological:  ?   Mental Status: She is oriented to person, place, and time.  ?Psychiatric:     ?   Mood and Affect: Mood normal.     ?   Behavior: Behavior normal.  ? ? ?Ht _0  (1.702 m)   Wt 285 lb 8 oz (129.5 kg)   LMP 09/07/2021   BMI 44.72 kg/m?  ?Wt Readings from Last 3 Encounters:  ?09/22/21 285 lb 8 oz (129.5 kg)  ?09/14/21 288 lb 12.8 oz (131 kg)  ?09/01/21 286 lb (129.7 kg)  ? ? ? ?Health Maintenance Due  ?Topic Date Due  ? TETANUS/TDAP  Never done  ? COVID-19 Vaccine (3 - Booster for Pfizer series) 01/15/2020  ? PAP SMEAR-Modifier  11/17/2021  ? ? ?There are no preventive care reminders to display for this patient. ? ?Lab Results  ?Component Value Date  ? TSH 1.470 11/29/2018  ? ?Lab Results  ?Component Value Date  ? WBC 8.2 08/28/2018  ? HGB 13.5 08/28/2018  ? HCT 41.4 08/28/2018  ? MCV 94.3 08/28/2018  ? PLT 289 08/28/2018  ? ?Lab Results  ?Component Value Date  ? NA 139 04/28/2021  ? K 4.2 04/28/2021  ? CO2 23 04/28/2021  ? GLUCOSE 89 04/28/2021  ? BUN 13 04/28/2021  ? CREATININE 0.76 04/28/2021  ? BILITOT 0.3 04/28/2021  ? ALKPHOS 71 04/28/2021  ? AST 16 04/28/2021  ? ALT 19 04/28/2021  ? PROT 6.8 04/28/2021  ? ALBUMIN 3.9 04/28/2021  ? CALCIUM 8.8 04/28/2021  ?  ANIONGAP 9 08/28/2018  ? EGFR 100 04/28/2021  ? ?Lab Results  ?Component Value Date  ? CHOL 215 (H) 04/28/2021  ? ?Lab Results  ?Component Value Date  ? HDL 47 04/28/2021  ? ?Lab Results  ?Component Value Date  ?  LDLCALC 153 (H) 04/28/2021  ? ?Lab Results  ?Component Value Date  ? TRIG 85 04/28/2021  ? ?Lab Results  ?Component Value Date  ? CHOLHDL 4.6 (H) 04/28/2021  ? ?Lab Results  ?Component Value Date  ? HGBA1C 5.9 (H) 04/28/2021  ? ? ?  ?Assessment & Plan:  ? ?Allergic rhinitis ?Advised starting Claritin 10 mg daily w/ regular use of nasal sprays ? ?2. Asthmatic ? Acute bronchitis ?Pt w/o history of asthma, but as symptoms improve with inhaler use and prolonged symptoms, suspicion  ?Rx prednisone 20 mg x 7 days ? ?F/u as scheduled for CPE 10/06/2021 ? ?I, Mikey Kirschner, PA-C have reviewed all documentation for this visit. The documentation on  09/22/2021 for the exam, diagnosis, procedures, and orders are all accurate and complete. ? ?Mikey Kirschner, PA-C ?[place] ?[id number] ?[place], [place], [address] ?Office: [phone] ?Fax: [phone]  ?

## 2021-09-25 ENCOUNTER — Ambulatory Visit: Payer: Self-pay | Admitting: *Deleted

## 2021-09-25 NOTE — Telephone Encounter (Signed)
?  Chief Complaint: dizziness from ear infection.   Forgot to tell Kelsey Hughes when seen on 4/4/ about it.   Requesting medication for the dizziness. ?Symptoms: dizziness  ?Frequency: intermittently ?Pertinent Negatives: Patient denies nausea or vomiting     ?Disposition: '[]'$ ED /'[]'$ Urgent Care (no appt availability in office) / '[]'$ Appointment(In office/virtual)/ '[]'$  [facility]/ '[]'$ Home Care/ '[]'$ Refused Recommended Disposition /'[]'$  Mobile Bus/ '[x]'$  Follow-up with PCP ?Additional Notes: Message sent to office for someone to call pt back about medication being called in for the dizziness.  ?

## 2021-09-25 NOTE — Telephone Encounter (Signed)
Reason for Disposition ? Dizziness is a chronic symptom (recurrent or ongoing AND present > 4 weeks) ? ?Answer Assessment - Initial Assessment Questions ?1. DESCRIPTION: "Describe your dizziness." ?    Pt is dizzy.    I was there last Tues.   I forgot to tell her I'm having dizzy spells.   The fluid in my ear is causing it.   I have a cough and ear infection that won't go away.   I've been seen several times for this.    ?Now I feel like the dizziness is from the ear.   I've never had dizzy spells except with an ear problem before. ?Is there anything I can take for the dizziness.   No nausea or vomiting.   It's not everyday.   I meant to tell her on Tues. But forgot because it wasn't happening then.     Since Feb. I  can count on one hand how many times I've been dizzy so it's not a lot. ?I have 2 events tomorrow and I can't be dizzy like this.   Is there something she can prescribe for me? ?2. LIGHTHEADED: "Do you feel lightheaded?" (e.g., somewhat faint, woozy, weak upon standing) ?    Yes from my ear infection. ?3. VERTIGO: "Do you feel like either you or the room is spinning or tilting?" (i.e. vertigo) ?    *No Answer* ?4. SEVERITY: "How bad is it?"  "Do you feel like you are going to faint?" "Can you stand and walk?" ?  - MILD: Feels slightly dizzy, but walking normally. ?  - MODERATE: Feels unsteady when walking, but not falling; interferes with normal activities (e.g., school, work). ?  - SEVERE: Unable to walk without falling, or requires assistance to walk without falling; feels like passing out now.  ?    *No Answer* ?5. ONSET:  "When did the dizziness begin?" ?    Back in Feb. When this ear problem began. ?6. AGGRAVATING FACTORS: "Does anything make it worse?" (e.g., standing, change in head position) ?    *No Answer* ?7. HEART RATE: "Can you tell me your heart rate?" "How many beats in 15 seconds?"  (Note: not all patients can do this)   ?    *No Answer* ?8. CAUSE: "What do you think is causing the  dizziness?" ?    My ear infection that won't go away. ?9. RECURRENT SYMPTOM: "Have you had dizziness before?" If Yes, ask: "When was the last time?" "What happened that time?" ?    Once before with an ear infection. ?10. OTHER SYMPTOMS: "Do you have any other symptoms?" (e.g., fever, chest pain, vomiting, diarrhea, bleeding) ?      Denies nausea or vomiting ?11. PREGNANCY: "Is there any chance you are pregnant?" "When was your last menstrual period?" ?      Not asked ? ?Protocols used: Dizziness - Lightheadedness-A-AH ? ?

## 2021-09-28 MED ORDER — MECLIZINE HCL 25 MG PO TABS: 25.0000 mg | ORAL_TABLET | Freq: Three times a day (TID) | ORAL | 0 refills | Status: DC | PRN

## 2021-09-28 NOTE — Addendum Note (Signed)
Addended by: Virginia Crews on: 09/28/2021 08:15 AM ? ? Modules accepted: Orders ? ?

## 2021-09-28 NOTE — Telephone Encounter (Signed)
Advised patient

## 2021-10-03 ENCOUNTER — Other Ambulatory Visit: Payer: Self-pay | Admitting: Family Medicine

## 2021-10-05 NOTE — Telephone Encounter (Signed)
Requested Prescriptions  ?Pending Prescriptions Disp Refills  ?? losartan (COZAAR) 50 MG tablet [Pharmacy Med Name: LOSARTAN POTASSIUM 50 MG TAB] 90 tablet 0  ?  Sig: TAKE 1 TABLET BY MOUTH EVERY DAY  ?  ? Cardiovascular:  Angiotensin Receptor Blockers Failed - 10/03/2021  1:45 AM  ?  ?  Failed - Last BP in normal range  ?  BP Readings from Last 1 Encounters:  ?09/22/21 (!) 122/93  ?   ?  ?  Passed - Cr in normal range and within 180 days  ?  Creat  ?Date Value Ref Range Status  ?09/18/2013 0.67 0.50 - 1.10 mg/dL Final  ? ?Creatinine, Ser  ?Date Value Ref Range Status  ?04/28/2021 0.76 0.57 - 1.00 mg/dL Final  ?   ?  ?  Passed - K in normal range and within 180 days  ?  Potassium  ?Date Value Ref Range Status  ?04/28/2021 4.2 3.5 - 5.2 mmol/L Final  ?   ?  ?  Passed - Patient is not pregnant  ?  ?  Passed - Valid encounter within last 6 months  ?  Recent Outpatient Visits   ?      ? 1 week ago Seasonal allergic rhinitis, unspecified trigger  ? [facility] Thed[facility] Mins, Ria Comment, PA-C  ? 3 weeks ago Cough in adult patient  ? [facility] [place], [place], [place]  ? 1 month ago Upper respiratory tract infection, unspecified type  ? [facility] Thedore Mins, Ria Comment, PA-C  ? 2 months ago Vaginal discharge  ? [facility] [place], Dionne Bucy, MD  ? 6 months ago Primary hypertension  ? [facility] Bacigalupo, Dionne Bucy, MD  ?  ?  ?Future Appointments   ?        ? Tomorrow Bacigalupo, Dionne Bucy, MD [facility]  ?  ? ?  ?  ?  ? ? ?

## 2021-10-06 ENCOUNTER — Other Ambulatory Visit (HOSPITAL_COMMUNITY)
Admission: RE | Admit: 2021-10-06 | Discharge: 2021-10-06 | Disposition: A | Discharge status: Home / Self Care | Payer: Medicaid Other | Source: Ambulatory Visit | Attending: Family Medicine | Admitting: Family Medicine

## 2021-10-06 ENCOUNTER — Encounter: Payer: Self-pay | Admitting: Family Medicine

## 2021-10-06 ENCOUNTER — Ambulatory Visit (INDEPENDENT_AMBULATORY_CARE_PROVIDER_SITE_OTHER): Payer: Medicaid Other | Admitting: Family Medicine

## 2021-10-06 VITALS — BP 120/86 | HR 95 | Temp 98.4°F | Resp 16 | Ht 66.0 in | Wt 286.3 lb

## 2021-10-06 DIAGNOSIS — I10 Essential (primary) hypertension: Secondary | ICD-10-CM | POA: Diagnosis not present

## 2021-10-06 DIAGNOSIS — E538 Deficiency of other specified B group vitamins: Secondary | ICD-10-CM | POA: Diagnosis not present

## 2021-10-06 DIAGNOSIS — Z124 Encounter for screening for malignant neoplasm of cervix: Secondary | ICD-10-CM | POA: Insufficient documentation

## 2021-10-06 DIAGNOSIS — E559 Vitamin D deficiency, unspecified: Secondary | ICD-10-CM | POA: Diagnosis not present

## 2021-10-06 DIAGNOSIS — N912 Amenorrhea, unspecified: Secondary | ICD-10-CM

## 2021-10-06 DIAGNOSIS — Z1231 Encounter for screening mammogram for malignant neoplasm of breast: Secondary | ICD-10-CM | POA: Diagnosis not present

## 2021-10-06 DIAGNOSIS — R7303 Prediabetes: Secondary | ICD-10-CM

## 2021-10-06 DIAGNOSIS — Z0000 Encounter for general adult medical examination without abnormal findings: Secondary | ICD-10-CM | POA: Insufficient documentation

## 2021-10-06 DIAGNOSIS — E782 Mixed hyperlipidemia: Secondary | ICD-10-CM

## 2021-10-06 LAB — POCT URINE PREGNANCY: Preg Test, Ur: NEGATIVE

## 2021-10-06 NOTE — Assessment & Plan Note (Signed)
Well controlled Continue current medications Recheck metabolic panel F/u in 6 months  

## 2021-10-06 NOTE — Progress Notes (Signed)
? ? ?I,Sulibeya S Dimas,acting as a scribe for Lavon Paganini, MD.,have documented all relevant documentation on the behalf of Lavon Paganini, MD,as directed by  Lavon Paganini, MD while in the presence of Lavon Paganini, MD. ? ? ?Complete physical exam ? ? ?Patient: Kelsey Hughes   DOB: 01-25-1981   41 y.o. Female  MRN: [id number] ?Visit Date: 10/06/2021 ? ?Today's healthcare provider: Lavon Paganini, MD  ? ?Chief Complaint  ?Patient presents with  ? Annual Exam  ? ?Subjective  ?  ?Kelsey Hughes is a 41 y.o. female who presents today for a complete physical exam.  ?She reports consuming a general diet. Home exercise routine includes treadmill. She generally feels well. She reports sleeping fairly well. She does not have additional problems to discuss today.  ?HPI  ?L ear is feeling better after prednisone and allergy meds ? ?3 days late on period, having unprotected sex ? ?Past Medical History:  ?Diagnosis Date  ? Allergy   ? Anemia   ? Cutaneous skin tags   ? Infertility, female   ? ?Past Surgical History:  ?Procedure Laterality Date  ? ANTERIOR CRUCIATE LIGAMENT REPAIR    ? DILATION AND CURETTAGE, DIAGNOSTIC / THERAPEUTIC    ? ?Social History  ? ?Socioeconomic History  ? Marital status: Single  ?  Spouse name: Not on file  ? Number of children: Not on file  ? Years of education: Not on file  ? Highest education level: Not on file  ?Occupational History  ? Occupation: Unemployed  ?Tobacco Use  ? Smoking status: Never  ? Smokeless tobacco: Never  ?Vaping Use  ? Vaping Use: Never used  ?Substance and Sexual Activity  ? Alcohol use: Yes  ?  Comment: occasional, not every week  ? Drug use: No  ? Sexual activity: Yes  ?  Partners: Male  ?  Birth control/protection: None  ?Other Topics Concern  ? Not on file  ?Social History Narrative  ? Not on file  ? ?Social Determinants of Health  ? ?Financial Resource Strain: Not on file  ?Food Insecurity: Not on file  ?Transportation Needs: Not on file  ?Physical  Activity: Not on file  ?Stress: Not on file  ?Social Connections: Not on file  ?Intimate Partner Violence: Not on file  ? ?Family Status  ?Relation Name Status  ? Father  Deceased  ? Mother  Alive  ? Mat Aunt  (Not Specified)  ? Mat Uncle  (Not Specified)  ? Neg Hx  (Not Specified)  ? ?Family History  ?Problem Relation Age of Onset  ? Hypertension Father   ? Heart disease Father   ? Stroke Father   ? Hypertension Mother   ? Atrial fibrillation Mother   ? Sleep apnea Mother   ? Diabetes Maternal Aunt   ? Diabetes Maternal Uncle   ? Colon cancer Neg Hx   ? Breast cancer Neg Hx   ? ?Allergies  ?Allergen Reactions  ? Lisinopril Cough  ? Sulfonamide Derivatives   ?  REACTION: ??? reaction during childhood  ?  ?Patient Care Team: ?Virginia Crews, MD as PCP - General (Family Medicine)  ? ?Medications: ?Outpatient Medications Prior to Visit  ?Medication Sig  ? albuterol (VENTOLIN HFA) 108 (90 Base) MCG/ACT inhaler Inhale 2 puffs into the lungs every 6 (six) hours as needed for wheezing or shortness of breath.  ? azelastine (ASTELIN) 0.1 % nasal spray Place 2 sprays into both nostrils 2 (two) times daily. Use in each nostril as  directed  ? cholecalciferol (VITAMIN D3) 25 MCG (1000 UT) tablet Take 1,000 Units by mouth daily.  ? clotrimazole-betamethasone (LOTRISONE) cream Apply 1 application topically 2 (two) times daily.  ? fluticasone (FLONASE) 50 MCG/ACT nasal spray Place 2 sprays into both nostrils daily.  ? ipratropium (ATROVENT) 0.03 % nasal spray Place 2 sprays into both nostrils every 12 (twelve) hours.  ? loratadine (CLARITIN) 10 MG tablet Take 1 tablet (10 mg total) by mouth daily.  ? losartan (COZAAR) 50 MG tablet TAKE 1 TABLET BY MOUTH EVERY DAY  ? meclizine (ANTIVERT) 25 MG tablet Take 1 tablet (25 mg total) by mouth 3 (three) times daily as needed for dizziness.  ? Multiple Vitamin (MULTIVITAMIN) tablet Take 1 tablet by mouth daily.  ? Omega-3 Fatty Acids (FISH OIL PO) Take by mouth.  ? omeprazole  (PRILOSEC) 20 MG capsule TAKE 1 CAPSULE BY MOUTH EVERY DAY  ? vitamin B-12 (CYANOCOBALAMIN) 1000 MCG tablet Take 1,000 mcg by mouth daily.  ? [DISCONTINUED] predniSONE (DELTASONE) 20 MG tablet Take 1 tablet (20 mg total) by mouth daily with breakfast. (Patient not taking: Reported on 10/06/2021)  ? ?No facility-administered medications prior to visit.  ? ? ?Review of Systems  ?All other systems reviewed and are negative. ? ?Last CBC ?Lab Results  ?Component Value Date  ? WBC 8.2 08/28/2018  ? HGB 13.5 08/28/2018  ? HCT 41.4 08/28/2018  ? MCV 94.3 08/28/2018  ? MCH 30.8 08/28/2018  ? RDW 13.7 08/28/2018  ? PLT 289 08/28/2018  ? ?Last metabolic panel ?Lab Results  ?Component Value Date  ? GLUCOSE 89 04/28/2021  ? NA 139 04/28/2021  ? K 4.2 04/28/2021  ? CL 103 04/28/2021  ? CO2 23 04/28/2021  ? BUN 13 04/28/2021  ? CREATININE 0.76 04/28/2021  ? EGFR 100 04/28/2021  ? CALCIUM 8.8 04/28/2021  ? PROT 6.8 04/28/2021  ? ALBUMIN 3.9 04/28/2021  ? LABGLOB 2.9 04/28/2021  ? AGRATIO 1.3 04/28/2021  ? BILITOT 0.3 04/28/2021  ? ALKPHOS 71 04/28/2021  ? AST 16 04/28/2021  ? ALT 19 04/28/2021  ? ANIONGAP 9 08/28/2018  ? ?Last lipids ?Lab Results  ?Component Value Date  ? CHOL 215 (H) 04/28/2021  ? HDL 47 04/28/2021  ? LDLCALC 153 (H) 04/28/2021  ? LDLDIRECT 130 (H) 09/18/2013  ? TRIG 85 04/28/2021  ? CHOLHDL 4.6 (H) 04/28/2021  ? ?Last hemoglobin A1c ?Lab Results  ?Component Value Date  ? HGBA1C 5.9 (H) 04/28/2021  ? ?Last thyroid functions ?Lab Results  ?Component Value Date  ? TSH 1.470 11/29/2018  ? ?  ? Objective  ? ?  ?BP 120/86 (BP Location: Left Arm, Patient Position: Sitting, Cuff Size: Large)   Pulse 95   Temp 98.4 ?F (36.9 ?C) (Oral)   Resp 16   Ht _0  (1.676 m)   Wt 286 lb 4.8 oz (129.9 kg)   LMP 09/07/2021   SpO2 98%   BMI 46.21 kg/m?  ?BP Readings from Last 3 Encounters:  ?10/06/21 120/86  ?09/22/21 (!) 122/93  ?09/14/21 139/88  ? ?Wt Readings from Last 3 Encounters:  ?10/06/21 286 lb 4.8 oz (129.9 kg)   ?09/22/21 285 lb 8 oz (129.5 kg)  ?09/14/21 288 lb 12.8 oz (131 kg)  ? ?  ?Physical Exam ?Vitals reviewed.  ?Constitutional:   ?   General: She is not in acute distress. ?   Appearance: Normal appearance. She is well-developed. She is not diaphoretic.  ?HENT:  ?   Head: Normocephalic and atraumatic.  ?  Right Ear: Tympanic membrane, ear canal and external ear normal.  ?   Left Ear: Tympanic membrane, ear canal and external ear normal.  ?   Nose: Nose normal.  ?   Mouth/Throat:  ?   Mouth: Mucous membranes are moist.  ?   Pharynx: Oropharynx is clear. No oropharyngeal exudate.  ?Eyes:  ?   General: No scleral icterus. ?   Conjunctiva/sclera: Conjunctivae normal.  ?   Pupils: Pupils are equal, round, and reactive to light.  ?Neck:  ?   Thyroid: No thyromegaly.  ?Cardiovascular:  ?   Rate and Rhythm: Normal rate and regular rhythm.  ?   Pulses: Normal pulses.  ?   Heart sounds: Normal heart sounds. No murmur heard. ?Pulmonary:  ?   Effort: Pulmonary effort is normal. No respiratory distress.  ?   Breath sounds: Normal breath sounds. No wheezing or rales.  ?Abdominal:  ?   General: There is no distension.  ?   Palpations: Abdomen is soft.  ?   Tenderness: There is no abdominal tenderness.  ?Genitourinary: ?   Comments: GYN:  External genitalia within normal limits.  Vaginal mucosa pink, moist, normal rugae.  Nonfriable cervix without lesions, no discharge or bleeding noted on speculum exam.  ?Musculoskeletal:     ?   General: No deformity.  ?   Cervical back: Neck supple.  ?   Right lower leg: No edema.  ?   Left lower leg: No edema.  ?Lymphadenopathy:  ?   Cervical: No cervical adenopathy.  ?Skin: ?   General: Skin is warm and dry.  ?   Findings: No rash.  ?Neurological:  ?   Mental Status: She is alert and oriented to person, place, and time. Mental status is at baseline.  ?   Gait: Gait normal.  ?Psychiatric:     ?   Mood and Affect: Mood normal.     ?   Behavior: Behavior normal.     ?   Thought Content: Thought  content normal.  ?  ? ? ?Last depression screening scores ? ?  10/06/2021  ?  9:51 AM 08/03/2021  ?  9:01 AM 04/07/2021  ? 11:31 AM  ?PHQ 2/9 Scores  ?PHQ - 2 Score 0 0 0  ?PHQ- 9 Score _0 ? ?Last fall risk screening ? ?  4/18/2

## 2021-10-06 NOTE — Assessment & Plan Note (Signed)
Recommend low carb diet °Recheck A1c  °

## 2021-10-06 NOTE — Assessment & Plan Note (Signed)
Discussed importance of healthy weight management Discussed diet and exercise  

## 2021-10-06 NOTE — Assessment & Plan Note (Signed)
Reviewed last lipid panel Not currently on a statin Recheck FLP and CMP Discussed diet and exercise  

## 2021-10-07 LAB — COMPREHENSIVE METABOLIC PANEL
ALT: 15 IU/L (ref 0–32)
AST: 15 IU/L (ref 0–40)
Albumin/Globulin Ratio: 1.7 (ref 1.2–2.2)
Albumin: 4.2 g/dL (ref 3.8–4.8)
Alkaline Phosphatase: 73 IU/L (ref 44–121)
BUN/Creatinine Ratio: 16 (ref 9–23)
BUN: 13 mg/dL (ref 6–24)
Bilirubin Total: 0.3 mg/dL (ref 0.0–1.2)
CO2: 24 mmol/L (ref 20–29)
Calcium: 9.4 mg/dL (ref 8.7–10.2)
Chloride: 105 mmol/L (ref 96–106)
Creatinine, Ser: 0.8 mg/dL (ref 0.57–1.00)
Globulin, Total: 2.5 g/dL (ref 1.5–4.5)
Glucose: 95 mg/dL (ref 70–99)
Potassium: 4.2 mmol/L (ref 3.5–5.2)
Sodium: 141 mmol/L (ref 134–144)
Total Protein: 6.7 g/dL (ref 6.0–8.5)
eGFR: 94 mL/min/{1.73_m2} (ref >59)

## 2021-10-07 LAB — CYTOLOGY - PAP
Adequacy: ABSENT
Comment: NEGATIVE
Diagnosis: NEGATIVE
High risk HPV: NEGATIVE

## 2021-10-07 LAB — LIPID PANEL
Chol/HDL Ratio: 4.7 ratio — ABNORMAL HIGH (ref 0.0–4.4)
Cholesterol, Total: 212 mg/dL — ABNORMAL HIGH (ref 100–199)
HDL: 45 mg/dL (ref >39)
LDL Chol Calc (NIH): 149 mg/dL — ABNORMAL HIGH (ref 0–99)
Triglycerides: 100 mg/dL (ref 0–149)
VLDL Cholesterol Cal: 18 mg/dL (ref 5–40)

## 2021-10-07 LAB — VITAMIN B12: Vitamin B-12: 310 pg/mL (ref 232–1245)

## 2021-10-07 LAB — VITAMIN D 25 HYDROXY (VIT D DEFICIENCY, FRACTURES): Vit D, 25-Hydroxy: 47.2 ng/mL (ref 30.0–100.0)

## 2021-10-07 LAB — HEMOGLOBIN A1C
Est. average glucose Bld gHb Est-mCnc: 126 mg/dL
Hgb A1c MFr Bld: 6 % — ABNORMAL HIGH (ref 4.8–5.6)

## 2021-11-29 ENCOUNTER — Other Ambulatory Visit: Payer: Self-pay | Admitting: Family Medicine

## 2021-12-06 ENCOUNTER — Other Ambulatory Visit: Payer: Self-pay | Admitting: Family Medicine

## 2021-12-07 ENCOUNTER — Other Ambulatory Visit: Payer: Self-pay | Admitting: Family Medicine

## 2021-12-07 MED ORDER — LOSARTAN POTASSIUM 50 MG PO TABS: 50.0000 mg | ORAL_TABLET | Freq: Every day | ORAL | 0 refills | Status: DC

## 2022-02-23 ENCOUNTER — Other Ambulatory Visit: Payer: Self-pay | Admitting: Family Medicine

## 2022-03-30 DIAGNOSIS — H5213 Myopia, bilateral: Secondary | ICD-10-CM | POA: Diagnosis not present

## 2022-04-19 ENCOUNTER — Other Ambulatory Visit: Payer: Self-pay | Admitting: Family Medicine

## 2022-05-04 ENCOUNTER — Ambulatory Visit
Admission: EM | Admit: 2022-05-04 | Discharge: 2022-05-04 | Disposition: A | Discharge status: Home / Self Care | Payer: Medicaid Other | Attending: Emergency Medicine | Admitting: Emergency Medicine

## 2022-05-04 DIAGNOSIS — I10 Essential (primary) hypertension: Secondary | ICD-10-CM | POA: Insufficient documentation

## 2022-05-04 DIAGNOSIS — J069 Acute upper respiratory infection, unspecified: Secondary | ICD-10-CM | POA: Insufficient documentation

## 2022-05-04 DIAGNOSIS — R7303 Prediabetes: Secondary | ICD-10-CM | POA: Insufficient documentation

## 2022-05-04 DIAGNOSIS — Z6841 Body Mass Index (BMI) 40.0 and over, adult: Secondary | ICD-10-CM | POA: Insufficient documentation

## 2022-05-04 DIAGNOSIS — R051 Acute cough: Secondary | ICD-10-CM | POA: Insufficient documentation

## 2022-05-04 DIAGNOSIS — Z3202 Encounter for pregnancy test, result negative: Secondary | ICD-10-CM | POA: Diagnosis not present

## 2022-05-04 DIAGNOSIS — Z79899 Other long term (current) drug therapy: Secondary | ICD-10-CM | POA: Diagnosis not present

## 2022-05-04 DIAGNOSIS — Z1152 Encounter for screening for COVID-19: Secondary | ICD-10-CM | POA: Diagnosis not present

## 2022-05-04 DIAGNOSIS — R059 Cough, unspecified: Secondary | ICD-10-CM | POA: Insufficient documentation

## 2022-05-04 LAB — POCT URINE PREGNANCY: Preg Test, Ur: NEGATIVE

## 2022-05-04 MED ORDER — BENZONATATE 100 MG PO CAPS: 100.0000 mg | ORAL_CAPSULE | Freq: Three times a day (TID) | ORAL | 0 refills | Status: DC | PRN

## 2022-05-04 NOTE — ED Provider Notes (Signed)
Roderic Palau    CSN: [id number] Arrival date & time: 05/04/22  1827      History   Chief Complaint Chief Complaint  Patient presents with   Cough   Otalgia   Nasal Congestion    HPI Kelsey Hughes is a 41 y.o. female.  Patient presents with 4-day history of ear pain, sinus pressure, congestion, cough.  No fever, chills, rash, shortness of breath, vomiting, diarrhea, or other symptoms.  Treatment attempted with OTC cold medication.  Patient also presents with request for pregnancy test as she is late for her cycle.  Her medical history includes hypertension, prediabetes, morbid obesity.  The history is provided by the patient and medical records.    Past Medical History:  Diagnosis Date   Allergy    Anemia    Cutaneous skin tags    Infertility, female     Patient Active Problem List   Diagnosis Date Noted   Cough 05/04/2022   Bilateral lower extremity edema 09/23/2020   Primary hypertension 09/23/2020   Other fatigue 07/27/2019   Mixed hyperlipidemia 07/27/2019   B12 deficiency 02/09/2019   Avitaminosis D 02/09/2019   Infertility, female 02/09/2019   Morbid obesity ([place]) 09/18/2013   Prediabetes 09/18/2013    Past Surgical History:  Procedure Laterality Date   ANTERIOR CRUCIATE LIGAMENT REPAIR     DILATION AND CURETTAGE, DIAGNOSTIC / THERAPEUTIC      OB History     Gravida  5   Para  2   Term  2   Preterm      AB  3   Living  2      SAB  2   IAB  1   Ectopic      Multiple      Live Births  2            Home Medications    Prior to Admission medications   Medication Sig Start Date End Date Taking? Authorizing Provider  benzonatate (TESSALON) 100 MG capsule Take 1 capsule (100 mg total) by mouth 3 (three) times daily as needed for cough. 05/04/22  Yes Sharion Balloon, NP  albuterol (VENTOLIN HFA) 108 (90 Base) MCG/ACT inhaler Inhale 2 puffs into the lungs every 6 (six) hours as needed for wheezing or shortness of breath.  08/01/20   Trinna Post, PA-C  azelastine (ASTELIN) 0.1 % nasal spray Place 2 sprays into both nostrils 2 (two) times daily. Use in each nostril as directed 09/01/21   Drubel, Ria Comment, PA-C  cholecalciferol (VITAMIN D3) 25 MCG (1000 UT) tablet Take 1,000 Units by mouth daily.    [provider]  clotrimazole-betamethasone (LOTRISONE) cream Apply 1 application topically 2 (two) times daily. 12/12/19   Virginia Crews, MD  fluticasone (FLONASE) 50 MCG/ACT nasal spray Place 2 sprays into both nostrils daily. 08/12/20   Virginia Crews, MD  ipratropium (ATROVENT) 0.03 % nasal spray Place 2 sprays into both nostrils every 12 (twelve) hours. 09/14/21   Ostwalt, Letitia Libra, PA-C  loratadine (CLARITIN) 10 MG tablet Take 1 tablet (10 mg total) by mouth daily. 09/22/21   Mikey Kirschner, PA-C  losartan (COZAAR) 50 MG tablet Take 1 tablet (50 mg total) by mouth daily. 12/07/21   Virginia Crews, MD  meclizine (ANTIVERT) 25 MG tablet Take 1 tablet (25 mg total) by mouth 3 (three) times daily as needed for dizziness. 09/28/21   Virginia Crews, MD  Multiple Vitamin (MULTIVITAMIN) tablet Take 1 tablet by mouth  daily.    [provider]  Omega-3 Fatty Acids (FISH OIL PO) Take by mouth.    [provider]  omeprazole (PRILOSEC) 20 MG capsule TAKE 1 CAPSULE BY MOUTH EVERY DAY 04/20/22   Bacigalupo, Dionne Bucy, MD  vitamin B-12 (CYANOCOBALAMIN) 1000 MCG tablet Take 1,000 mcg by mouth daily.    [provider]    Family History Family History  Problem Relation Age of Onset   Hypertension Father    Heart disease Father    Stroke Father    Hypertension Mother    Atrial fibrillation Mother    Sleep apnea Mother    Diabetes Maternal Aunt    Diabetes Maternal Uncle    Colon cancer Neg Hx    Breast cancer Neg Hx     Social History Social History   Tobacco Use   Smoking status: Never   Smokeless tobacco: Never  Vaping Use   Vaping Use: Never used  Substance  Use Topics   Alcohol use: Yes    Comment: occasional, not every week   Drug use: No     Allergies   Lisinopril and Sulfonamide derivatives   Review of Systems Review of Systems  Constitutional:  Negative for chills and fever.  HENT:  Positive for congestion, ear pain and sinus pressure. Negative for sore throat.   Respiratory:  Positive for cough. Negative for shortness of breath.   Cardiovascular:  Negative for chest pain and palpitations.  Gastrointestinal:  Negative for diarrhea and vomiting.  Skin:  Negative for rash.  All other systems reviewed and are negative.    Physical Exam Triage Vital Signs ED Triage Vitals  Enc Vitals Group     BP      Pulse      Resp      Temp      Temp src      SpO2      Weight      Height      Head Circumference      Peak Flow      Pain Score      Pain Loc      Pain Edu?      Excl. in [place]?    No data found.  Updated Vital Signs BP (!) 143/93   Pulse 90   Temp 98.4 F (36.9 C)   Resp 18   Ht '5\' 6"'$  (1.676 m)   Wt 270 lb (122.5 kg)   LMP 03/21/2022   SpO2 97%   BMI 43.58 kg/m   Visual Acuity Right Eye Distance:   Left Eye Distance:   Bilateral Distance:    Right Eye Near:   Left Eye Near:    Bilateral Near:     Physical Exam Vitals and nursing note reviewed.  Constitutional:      General: She is not in acute distress.    Appearance: She is well-developed. She is not ill-appearing.  HENT:     Right Ear: Tympanic membrane normal.     Left Ear: Tympanic membrane normal.     Nose: Congestion and rhinorrhea present.     Mouth/Throat:     Mouth: Mucous membranes are moist.     Pharynx: Oropharynx is clear.  Cardiovascular:     Rate and Rhythm: Normal rate and regular rhythm.     Heart sounds: Normal heart sounds.  Pulmonary:     Effort: Pulmonary effort is normal. No respiratory distress.     Breath sounds: Normal breath sounds.  Musculoskeletal:     Cervical back: Neck supple.  Skin:    General: Skin is  warm and dry.  Neurological:     Mental Status: She is alert.  Psychiatric:        Mood and Affect: Mood normal.        Behavior: Behavior normal.      UC Treatments / Results  Labs (all labs ordered are listed, but only abnormal results are displayed) Labs Reviewed  SARS CORONAVIRUS 2 (TAT 6-24 HRS)  POCT URINE PREGNANCY    EKG   Radiology No results found.  Procedures Procedures (including critical care time)  Medications Ordered in UC Medications - No data to display  Initial Impression / Assessment and Plan / UC Course  I have reviewed the triage vital signs and the nursing notes.  Pertinent labs & imaging results that were available during my care of the patient were reviewed by me and considered in my medical decision making (see chart for details).   Cough, viral URI.  Negative pregnancy test.  COVID pending.  Discussed symptomatic treatment including Tessalon Perles, Tylenol or ibuprofen, rest, hydration.  Instructed patient to follow up with her PCP if symptoms are not improving.  She agrees to plan of care.    Final Clinical Impressions(s) / UC Diagnoses   Final diagnoses:  Acute cough  Viral URI  Negative pregnancy test     Discharge Instructions      Your COVID test is pending.  Take Tylenol or ibuprofen as needed for fever or discomfort.  Take the [facility] as directed for cough.  Rest and keep yourself hydrated.    Follow-up with your primary care provider if your symptoms are not improving.         ED Prescriptions     Medication Sig Dispense Auth. Provider   benzonatate (TESSALON) 100 MG capsule Take 1 capsule (100 mg total) by mouth 3 (three) times daily as needed for cough. 21 capsule Sharion Balloon, NP      PDMP not reviewed this encounter.   Sharion Balloon, NP 05/04/22 [phone]

## 2022-05-04 NOTE — ED Triage Notes (Addendum)
Patient to Urgent Care with complaints of nasal congestion, cough, otalgia and facial (sinus) pain x4 days. Denies any fevers. Describes cough as productive with clear phlegm.   Reports her partner is also sick with the same symptoms.   Has been taking tylenol/ otc cold and flu meds. Reports being late on her period and concerned for possible pregnancy.

## 2022-05-04 NOTE — Discharge Instructions (Addendum)
Your COVID test is pending.  Take Tylenol or ibuprofen as needed for fever or discomfort.  Take the [facility] as directed for cough.  Rest and keep yourself hydrated.    Follow-up with your primary care provider if your symptoms are not improving.

## 2022-05-06 LAB — SARS CORONAVIRUS 2 (TAT 6-24 HRS): SARS Coronavirus 2: NEGATIVE

## 2022-05-11 ENCOUNTER — Other Ambulatory Visit (HOSPITAL_COMMUNITY)
Admission: RE | Admit: 2022-05-11 | Discharge: 2022-05-11 | Disposition: A | Discharge status: Home / Self Care | Payer: Medicaid Other | Source: Ambulatory Visit | Attending: Family Medicine | Admitting: Family Medicine

## 2022-05-11 ENCOUNTER — Ambulatory Visit: Payer: Medicaid Other | Admitting: Family Medicine

## 2022-05-11 ENCOUNTER — Encounter: Payer: Self-pay | Admitting: Family Medicine

## 2022-05-11 VITALS — BP 120/69 | HR 89 | Temp 98.0°F | Resp 16 | Ht 66.0 in | Wt 279.0 lb

## 2022-05-11 DIAGNOSIS — R7303 Prediabetes: Secondary | ICD-10-CM | POA: Diagnosis not present

## 2022-05-11 DIAGNOSIS — N898 Other specified noninflammatory disorders of vagina: Secondary | ICD-10-CM

## 2022-05-11 DIAGNOSIS — N926 Irregular menstruation, unspecified: Secondary | ICD-10-CM

## 2022-05-11 DIAGNOSIS — Z1329 Encounter for screening for other suspected endocrine disorder: Secondary | ICD-10-CM | POA: Diagnosis not present

## 2022-05-11 DIAGNOSIS — R635 Abnormal weight gain: Secondary | ICD-10-CM | POA: Diagnosis not present

## 2022-05-11 NOTE — Progress Notes (Signed)
Established patient visit   Patient: Kelsey Hughes   DOB: 03-Jan-1981   41 y.o. Female  MRN: [id number] Visit Date: 05/11/2022  Today's healthcare provider: Gwyneth Sprout, FNP  Re Introduced to nurse practitioner role and practice setting.  All questions answered.  Discussed provider/patient relationship and expectations.   I,Tiffany J Bragg,acting as a scribe for Gwyneth Sprout, FNP.,have documented all relevant documentation on the behalf of Gwyneth Sprout, FNP,as directed by  Gwyneth Sprout, FNP while in the presence of Gwyneth Sprout, FNP.   Chief Complaint  Patient presents with   Amenorrhea    Patient states her period is 14 days late with abnormal discharge. Has had 2 negative pregnancy tests with the last one being today.    Subjective    HPI HPI     Amenorrhea    Additional comments: Patient states her period is 14 days late with abnormal discharge. Has had 2 negative pregnancy tests with the last one being today.       Last edited by Smitty Knudsen, CMA on 05/11/2022  4:05 PM.       Medications: Outpatient Medications Prior to Visit  Medication Sig   albuterol (VENTOLIN HFA) 108 (90 Base) MCG/ACT inhaler Inhale 2 puffs into the lungs every 6 (six) hours as needed for wheezing or shortness of breath.   azelastine (ASTELIN) 0.1 % nasal spray Place 2 sprays into both nostrils 2 (two) times daily. Use in each nostril as directed   benzonatate (TESSALON) 100 MG capsule Take 1 capsule (100 mg total) by mouth 3 (three) times daily as needed for cough.   cholecalciferol (VITAMIN D3) 25 MCG (1000 UT) tablet Take 1,000 Units by mouth daily.   clotrimazole-betamethasone (LOTRISONE) cream Apply 1 application topically 2 (two) times daily.   fluticasone (FLONASE) 50 MCG/ACT nasal spray Place 2 sprays into both nostrils daily.   ipratropium (ATROVENT) 0.03 % nasal spray Place 2 sprays into both nostrils every 12 (twelve) hours.   loratadine (CLARITIN) 10 MG tablet Take 1  tablet (10 mg total) by mouth daily.   losartan (COZAAR) 50 MG tablet Take 1 tablet (50 mg total) by mouth daily.   meclizine (ANTIVERT) 25 MG tablet Take 1 tablet (25 mg total) by mouth 3 (three) times daily as needed for dizziness.   Multiple Vitamin (MULTIVITAMIN) tablet Take 1 tablet by mouth daily.   Omega-3 Fatty Acids (FISH OIL PO) Take by mouth.   omeprazole (PRILOSEC) 20 MG capsule TAKE 1 CAPSULE BY MOUTH EVERY DAY   vitamin B-12 (CYANOCOBALAMIN) 1000 MCG tablet Take 1,000 mcg by mouth daily.   No facility-administered medications prior to visit.    Review of Systems    Objective    BP 120/69 (BP Location: Left Arm, Patient Position: Sitting, Cuff Size: Large)   Pulse 89   Temp 98 F (36.7 C) (Oral)   Resp 16   Ht '5\' 6"'$  (1.676 m)   Wt 279 lb (126.6 kg)   LMP 03/21/2022   SpO2 99%   BMI 45.03 kg/m   Physical Exam Vitals and nursing note reviewed.  Constitutional:      General: She is not in acute distress.    Appearance: Normal appearance. She is obese. She is not ill-appearing, toxic-appearing or diaphoretic.  HENT:     Head: Normocephalic and atraumatic.  Cardiovascular:     Rate and Rhythm: Normal rate and regular rhythm.     Pulses: Normal pulses.  Heart sounds: Normal heart sounds. No murmur heard.    No friction rub. No gallop.  Pulmonary:     Effort: Pulmonary effort is normal. No respiratory distress.     Breath sounds: Normal breath sounds. No stridor. No wheezing, rhonchi or rales.  Chest:     Chest wall: No tenderness.  Abdominal:     General: Bowel sounds are normal.     Palpations: Abdomen is soft.     Tenderness: There is no abdominal tenderness. There is no right CVA tenderness, left CVA tenderness or guarding.  Musculoskeletal:        General: No swelling, tenderness, deformity or signs of injury. Normal range of motion.     Right lower leg: No edema.     Left lower leg: No edema.  Skin:    General: Skin is warm and dry.     Capillary  Refill: Capillary refill takes less than 2 seconds.     Coloration: Skin is not jaundiced or pale.     Findings: No bruising, erythema, lesion or rash.  Neurological:     General: No focal deficit present.     Mental Status: She is alert and oriented to person, place, and time. Mental status is at baseline.     Cranial Nerves: No cranial nerve deficit.     Sensory: No sensory deficit.     Motor: No weakness.     Coordination: Coordination normal.  Psychiatric:        Mood and Affect: Mood normal.        Behavior: Behavior normal.        Thought Content: Thought content normal.        Judgment: Judgment normal.      No results found for any visits on 05/11/22.  Assessment & Plan     Problem List Items Addressed This Visit       Other   Irregular menses - Primary    Patient reports irregularity in menses x1 month; will complete hormone levels to discern peri-menopausal vs pregnancy       Relevant Orders   hCG, serum, qualitative   hCG, quantitative, pregnancy   Testosterone,Free and Total   Estradiol   FSH/LH   Missed period    Reports period is 14 days late; notes that she is TTC       Relevant Orders   hCG, serum, qualitative   hCG, quantitative, pregnancy   Testosterone,Free and Total   Estradiol   FSH/LH   Prediabetes    Chronic, previously controlled with diet/exercise Repeat A1c given variability in weight Continue to recommend balanced, lower carb meals. Smaller meal size, adding snacks. Choosing water as drink of choice and increasing purposeful exercise.       Relevant Orders   Hemoglobin A1c   Screening for thyroid disorder    Recommend thyroid screening given variability in weight Body mass index is 45.03 kg/m.       Relevant Orders   TSH + free T4   Vaginal discharge    X1-2 weeks; request for STI testing Denies irritation, odor or known STI exposure      Relevant Orders   Hepatitis C Antibody   HIV antibody (with reflex)   RPR    Urine cytology ancillary only   Weight gain finding    Recommend both Thyroid and DM screening given weight gain and previously variability      Relevant Orders   Hemoglobin A1c   TSH + free T4  Return if symptoms worsen or fail to improve.      Vonna Kotyk, FNP, have reviewed all documentation for this visit. The documentation on 05/11/22 for the exam, diagnosis, procedures, and orders are all accurate and complete.    Gwyneth Sprout, [place] [phone] (phone) [phone] (fax)  [place]

## 2022-05-11 NOTE — Assessment & Plan Note (Signed)
Reports period is 14 days late; notes that she is TTC

## 2022-05-11 NOTE — Assessment & Plan Note (Signed)
Recommend both Thyroid and DM screening given weight gain and previously variability

## 2022-05-11 NOTE — Assessment & Plan Note (Signed)
Recommend thyroid screening given variability in weight Body mass index is 45.03 kg/m.

## 2022-05-11 NOTE — Assessment & Plan Note (Signed)
Patient reports irregularity in menses x1 month; will complete hormone levels to discern peri-menopausal vs pregnancy

## 2022-05-11 NOTE — Assessment & Plan Note (Signed)
Chronic, previously controlled with diet/exercise Repeat A1c given variability in weight Continue to recommend balanced, lower carb meals. Smaller meal size, adding snacks. Choosing water as drink of choice and increasing purposeful exercise.

## 2022-05-11 NOTE — Assessment & Plan Note (Signed)
X1-2 weeks; request for STI testing Denies irritation, odor or known STI exposure

## 2022-05-12 ENCOUNTER — Encounter: Payer: Self-pay | Admitting: *Deleted

## 2022-05-12 DIAGNOSIS — N926 Irregular menstruation, unspecified: Secondary | ICD-10-CM

## 2022-05-12 NOTE — Progress Notes (Signed)
Pre-diabetes remains. hCG was negative. All other labs, including hormones are normal.  Kelsey Hughes, [place] [address] [place], [place] [address] [phone] (phone) [phone] (fax) [place]

## 2022-05-14 LAB — URINE CYTOLOGY ANCILLARY ONLY
Bacterial Vaginitis-Urine: NEGATIVE
Candida Urine: NEGATIVE
Chlamydia: NEGATIVE
Comment: NEGATIVE
Comment: NEGATIVE
Comment: NORMAL
Neisseria Gonorrhea: NEGATIVE
Trichomonas: NEGATIVE

## 2022-05-15 ENCOUNTER — Encounter: Payer: Self-pay | Admitting: Family Medicine

## 2022-05-15 LAB — TESTOSTERONE,FREE AND TOTAL
Testosterone, Free: 1.4 pg/mL (ref 0.0–4.2)
Testosterone: 20 ng/dL (ref 4–50)

## 2022-05-15 LAB — HEMOGLOBIN A1C
Est. average glucose Bld gHb Est-mCnc: 128 mg/dL
Hgb A1c MFr Bld: 6.1 % — ABNORMAL HIGH (ref 4.8–5.6)

## 2022-05-15 LAB — HIV ANTIBODY (ROUTINE TESTING W REFLEX): HIV Screen 4th Generation wRfx: NONREACTIVE

## 2022-05-15 LAB — TSH+FREE T4
Free T4: 1.16 ng/dL (ref 0.82–1.77)
TSH: 1.84 u[IU]/mL (ref 0.450–4.500)

## 2022-05-15 LAB — FSH/LH
FSH: 4 m[IU]/mL
LH: 7.1 m[IU]/mL

## 2022-05-15 LAB — HEPATITIS C ANTIBODY: Hep C Virus Ab: NONREACTIVE

## 2022-05-15 LAB — HCG, SERUM, QUALITATIVE: hCG,Beta Subunit,Qual,Serum: NEGATIVE m[IU]/mL (ref <6)

## 2022-05-15 LAB — RPR: RPR Ser Ql: NONREACTIVE

## 2022-05-15 LAB — ESTRADIOL: Estradiol: 218 pg/mL

## 2022-06-04 ENCOUNTER — Ambulatory Visit (INDEPENDENT_AMBULATORY_CARE_PROVIDER_SITE_OTHER): Payer: Medicaid Other | Admitting: Licensed Practical Nurse

## 2022-06-04 VITALS — BP 140/80 | HR 81 | Wt 283.3 lb

## 2022-06-04 DIAGNOSIS — Z3202 Encounter for pregnancy test, result negative: Secondary | ICD-10-CM

## 2022-06-04 DIAGNOSIS — N912 Amenorrhea, unspecified: Secondary | ICD-10-CM | POA: Diagnosis not present

## 2022-06-04 DIAGNOSIS — N926 Irregular menstruation, unspecified: Secondary | ICD-10-CM

## 2022-06-04 LAB — POCT URINE PREGNANCY: Preg Test, Ur: NEGATIVE

## 2022-06-07 ENCOUNTER — Encounter: Payer: Medicaid Other | Admitting: Obstetrics and Gynecology

## 2022-06-07 NOTE — Progress Notes (Signed)
HPI:      Kelsey Hughes is a 41 y.o. [id number] who LMP was Patient's last menstrual period was 05/16/2022 (exact date)., presents today for a problem visit.  She complains of  longer cycle x 1 cycle .  Pt states she has always had a regular cycle (every 21 days, lasting 5 days, the first 3 days heavy (2-4 pad per day) followed by light bleeding).  She had a cycle that started Oct 17 but did not start at the expected time. It did not come until November 26.  She has never missed a period, unless she was pregnant.  She has taken home pregnancy tests, they are negative. She did have some increased discharge, had screening done and it all was negative.  She is sexually active with 1 female partner, she does desire pregnancy so has not been using contraception. She has had spotting after IC  She has always been over 200lbs.  Her mother went into menopause in her early 38's, pt denies hot flashes or other sxs  She did have a cold, but otherwise denies any illness or stressors during the time of the extended cycle.   Denies hx of PCOS   PMHx: She  has a past medical history of Allergy, Anemia, Cutaneous skin tags, and Infertility, female. Also,  has a past surgical history that includes Dilation and curettage, diagnostic / therapeutic and Anterior cruciate ligament repair., family history includes Atrial fibrillation in her mother; Diabetes in her maternal aunt and maternal uncle; Heart disease in her father; Hypertension in her father and mother; Sleep apnea in her mother; Stroke in her father.,  reports that she has never smoked. She has never used smokeless tobacco. She reports current alcohol use. She reports that she does not use drugs.  She  Current Outpatient Medications:    albuterol (VENTOLIN HFA) 108 (90 Base) MCG/ACT inhaler, Inhale 2 puffs into the lungs every 6 (six) hours as needed for wheezing or shortness of breath., Disp: 1 each, Rfl: 2   azelastine (ASTELIN) 0.1 % nasal spray, Place 2  sprays into both nostrils 2 (two) times daily. Use in each nostril as directed, Disp: 30 mL, Rfl: 3   benzonatate (TESSALON) 100 MG capsule, Take 1 capsule (100 mg total) by mouth 3 (three) times daily as needed for cough., Disp: 21 capsule, Rfl: 0   cholecalciferol (VITAMIN D3) 25 MCG (1000 UT) tablet, Take 1,000 Units by mouth daily., Disp: , Rfl:    clotrimazole-betamethasone (LOTRISONE) cream, Apply 1 application topically 2 (two) times daily., Disp: 45 g, Rfl: 1   fluticasone (FLONASE) 50 MCG/ACT nasal spray, Place 2 sprays into both nostrils daily., Disp: 16 g, Rfl: 6   ipratropium (ATROVENT) 0.03 % nasal spray, Place 2 sprays into both nostrils every 12 (twelve) hours., Disp: 30 mL, Rfl: 12   loratadine (CLARITIN) 10 MG tablet, Take 1 tablet (10 mg total) by mouth daily., Disp: 90 tablet, Rfl: 1   losartan (COZAAR) 50 MG tablet, Take 1 tablet (50 mg total) by mouth daily., Disp: 90 tablet, Rfl: 0   meclizine (ANTIVERT) 25 MG tablet, Take 1 tablet (25 mg total) by mouth 3 (three) times daily as needed for dizziness., Disp: 30 tablet, Rfl: 0   Multiple Vitamin (MULTIVITAMIN) tablet, Take 1 tablet by mouth daily., Disp: , Rfl:    Omega-3 Fatty Acids (FISH OIL PO), Take by mouth., Disp: , Rfl:    omeprazole (PRILOSEC) 20 MG capsule, TAKE 1 CAPSULE BY MOUTH EVERY  DAY, Disp: 90 capsule, Rfl: 3   vitamin B-12 (CYANOCOBALAMIN) 1000 MCG tablet, Take 1,000 mcg by mouth daily., Disp: , Rfl:   Also, is allergic to lisinopril and sulfonamide derivatives.  ROS see HPI   Objective: BP (!) 140/80   Pulse 81   Wt 283 lb 4.8 oz (128.5 kg)   LMP 05/16/2022 (Exact Date)   BMI 45.73 kg/m  Physical Exam Constitutional:      Appearance: Normal appearance.  Pulmonary:     Effort: Pulmonary effort is normal.  Neurological:     Mental Status: She is alert.  Psychiatric:        Mood and Affect: Mood normal.    PE not necessary  ASSESSMENT/PLAN:  irregular menses   Problem List Items Addressed This  Visit       Other   Missed period - Primary   Relevant Orders   POCT urine pregnancy (Completed)    Discussed reasons for extended cycle-could be random and not concerning, As this was your first cycle that was abnormal, we need more information to determine if there is a reason for concern.  Perimenopause often  starts with a change in cycles, obtaining hormone levels would not be beneficial today.   Pt will continue to monitor cycles. And follow up in March.   Roberto Scales, [facility]  06/07/22  10:26 AM

## 2022-07-15 ENCOUNTER — Other Ambulatory Visit: Payer: Self-pay | Admitting: Family Medicine

## 2022-09-11 ENCOUNTER — Other Ambulatory Visit: Payer: Self-pay | Admitting: Family Medicine

## 2022-09-12 ENCOUNTER — Emergency Department
Admission: EM | Admit: 2022-09-12 | Discharge: 2022-09-12 | Disposition: A | Discharge status: Home / Self Care | Payer: Medicaid Other | Attending: Emergency Medicine | Admitting: Emergency Medicine

## 2022-09-12 ENCOUNTER — Other Ambulatory Visit: Payer: Self-pay

## 2022-09-12 DIAGNOSIS — R1084 Generalized abdominal pain: Secondary | ICD-10-CM

## 2022-09-12 DIAGNOSIS — R509 Fever, unspecified: Secondary | ICD-10-CM | POA: Insufficient documentation

## 2022-09-12 DIAGNOSIS — I10 Essential (primary) hypertension: Secondary | ICD-10-CM | POA: Insufficient documentation

## 2022-09-12 DIAGNOSIS — R197 Diarrhea, unspecified: Secondary | ICD-10-CM | POA: Insufficient documentation

## 2022-09-12 DIAGNOSIS — R112 Nausea with vomiting, unspecified: Secondary | ICD-10-CM | POA: Insufficient documentation

## 2022-09-12 DIAGNOSIS — Z20822 Contact with and (suspected) exposure to covid-19: Secondary | ICD-10-CM | POA: Insufficient documentation

## 2022-09-12 LAB — URINALYSIS, ROUTINE W REFLEX MICROSCOPIC
Bacteria, UA: NONE SEEN
Bilirubin Urine: NEGATIVE
Glucose, UA: NEGATIVE mg/dL
Ketones, ur: NEGATIVE mg/dL
Nitrite: NEGATIVE
Protein, ur: 100 mg/dL — AB
RBC / HPF: >50 RBC/hpf (ref 0–5)
Specific Gravity, Urine: 1.026 (ref 1.005–1.030)
WBC, UA: >50 WBC/hpf (ref 0–5)
pH: 5 (ref 5.0–8.0)

## 2022-09-12 LAB — CBC
HCT: 37.7 % (ref 36.0–46.0)
Hemoglobin: 12 g/dL (ref 12.0–15.0)
MCH: 29.7 pg (ref 26.0–34.0)
MCHC: 31.8 g/dL (ref 30.0–36.0)
MCV: 93.3 fL (ref 80.0–100.0)
Platelets: 334 10*3/uL (ref 150–400)
RBC: 4.04 MIL/uL (ref 3.87–5.11)
RDW: 14.5 % (ref 11.5–15.5)
WBC: 10.8 10*3/uL — ABNORMAL HIGH (ref 4.0–10.5)
nRBC: 0 % (ref 0.0–0.2)

## 2022-09-12 LAB — COMPREHENSIVE METABOLIC PANEL
ALT: 17 U/L (ref 0–44)
AST: 18 U/L (ref 15–41)
Albumin: 3.5 g/dL (ref 3.5–5.0)
Alkaline Phosphatase: 55 U/L (ref 38–126)
Anion gap: 4 — ABNORMAL LOW (ref 5–15)
BUN: 15 mg/dL (ref 6–20)
CO2: 22 mmol/L (ref 22–32)
Calcium: 8.2 mg/dL — ABNORMAL LOW (ref 8.9–10.3)
Chloride: 109 mmol/L (ref 98–111)
Creatinine, Ser: 0.74 mg/dL (ref 0.44–1.00)
GFR, Estimated: >60 mL/min (ref >60)
Glucose, Bld: 108 mg/dL — ABNORMAL HIGH (ref 70–99)
Potassium: 3.4 mmol/L — ABNORMAL LOW (ref 3.5–5.1)
Sodium: 135 mmol/L (ref 135–145)
Total Bilirubin: 0.7 mg/dL (ref 0.3–1.2)
Total Protein: 6.9 g/dL (ref 6.5–8.1)

## 2022-09-12 LAB — RESP PANEL BY RT-PCR (RSV, FLU A&B, COVID)  RVPGX2
Influenza A by PCR: NEGATIVE
Influenza B by PCR: NEGATIVE
Resp Syncytial Virus by PCR: NEGATIVE
SARS Coronavirus 2 by RT PCR: NEGATIVE

## 2022-09-12 LAB — POC URINE PREG, ED: Preg Test, Ur: NEGATIVE

## 2022-09-12 LAB — LIPASE, BLOOD: Lipase: 28 U/L (ref 11–51)

## 2022-09-12 MED ORDER — SODIUM CHLORIDE 0.9 % IV BOLUS
1000.0000 mL | Freq: Once | INTRAVENOUS | Status: AC
  Administered 2022-09-12: 1000 mL via INTRAVENOUS

## 2022-09-12 MED ORDER — ONDANSETRON HCL 4 MG/2ML IJ SOLN
4.0000 mg | Freq: Once | INTRAMUSCULAR | Status: AC
  Administered 2022-09-12: 4 mg via INTRAVENOUS
  Filled 2022-09-12: qty 2

## 2022-09-12 MED ORDER — ONDANSETRON 8 MG PO TBDP: 8.0000 mg | ORAL_TABLET | Freq: Three times a day (TID) | ORAL | 0 refills | Status: DC | PRN

## 2022-09-12 NOTE — ED Triage Notes (Signed)
Pt to ED via POV from home. Pt reports N/V/D that started last night. Pt also reports abdominal cramping. Pt also reports hot flashes and chills. Pt reports initially thought she was pregnant but started her period yesterday.

## 2022-09-12 NOTE — ED Notes (Signed)
PO challenge successful.

## 2022-09-12 NOTE — ED Notes (Signed)
Pt sound asleep. Pt awakened to get a urine sample. Pt able to ambulate to restroom and supply a sample of urine that was sent to lab. Pt POC preg is negative.

## 2022-09-12 NOTE — ED Provider Notes (Signed)
[facility] Provider Note   Event Date/Time   First MD Initiated Contact with Patient 09/12/22 1321     (approximate) History  Emesis  HPI Kelsey Hughes is a 42 y.o. female with a past medical history of morbid obesity, prediabetes, hypertension who presents complaining of nausea/vomiting/diarrhea that began last night with associated 5/10 generalized abdominal cramping.  Patient also endorses subjective fevers with associated chills.  Patient denies any recent travel or sick contacts ROS: Patient currently denies any vision changes, tinnitus, difficulty speaking, facial droop, sore throat, chest pain, shortness of breath, dysuria, or weakness/numbness/paresthesias in any extremity   Physical Exam  Triage Vital Signs: ED Triage Vitals  Enc Vitals Group     BP 09/12/22 1310 134/89     Pulse Rate 09/12/22 1310 (!) 106     Resp 09/12/22 1310 18     Temp 09/12/22 1310 98.2 F (36.8 C)     Temp Source 09/12/22 1310 Oral     SpO2 09/12/22 1310 99 %     Weight --      Height --      Head Circumference --      Peak Flow --      Pain Score 09/12/22 1308 5     Pain Loc --      Pain Edu? --      Excl. in [place]? --    Most recent vital signs: Vitals:   09/12/22 1310 09/12/22 1729  BP: 134/89 130/73  Pulse: (!) 106 (!) 105  Resp: 18 16  Temp: 98.2 F (36.8 C)   SpO2: 99% 93%   General: Awake, oriented x4. CV:  Good peripheral perfusion.  Resp:  Normal effort. Abd:  No distention. Other:  Morbidly obese middle-aged Caucasian female laying in bed in no acute distress ED Results / Procedures / Treatments  Labs (all labs ordered are listed, but only abnormal results are displayed) Labs Reviewed  COMPREHENSIVE METABOLIC PANEL - Abnormal; Notable for the following components:      Result Value   Potassium 3.4 (*)    Glucose, Bld 108 (*)    Calcium 8.2 (*)    Anion gap 4 (*)    All other components within normal limits  CBC - Abnormal; Notable for the  following components:   WBC 10.8 (*)    All other components within normal limits  URINALYSIS, ROUTINE W REFLEX MICROSCOPIC - Abnormal; Notable for the following components:   Color, Urine YELLOW (*)    APPearance CLOUDY (*)    Hgb urine dipstick LARGE (*)    Protein, ur 100 (*)    Leukocytes,Ua MODERATE (*)    All other components within normal limits  RESP PANEL BY RT-PCR (RSV, FLU A&B, COVID)  RVPGX2  LIPASE, BLOOD  POC URINE PREG, ED  PROCEDURES: Critical Care performed: No Procedures MEDICATIONS ORDERED IN ED: Medications  sodium chloride 0.9 % bolus 1,000 mL (0 mLs Intravenous Stopped 09/12/22 1730)  ondansetron (ZOFRAN) injection 4 mg (4 mg Intravenous Given 09/12/22 1404)  ondansetron (ZOFRAN) injection 4 mg (4 mg Intravenous Given 09/12/22 1606)   IMPRESSION / MDM / ASSESSMENT AND PLAN / ED COURSE  I reviewed the triage vital signs and the nursing notes.                             Patient's presentation is most consistent with acute presentation with potential threat to life or bodily  function. Patient presents for acute nausea/vomiting The cause of the patients symptoms is not clear, but the patient is overall well appearing and is suspected to have a transient course of illness.  Given History and Exam there does not appear to be an emergent cause of the symptoms such as small bowel obstruction, coronary syndrome, bowel ischemia, DKA, pancreatitis, appendicitis, other acute abdomen or other emergent problem.  Reassessment: After treatment, the patient is feeling much better, tolerating PO fluids, and shows no signs of dehydration.   Disposition: Discharge home with prompt primary care physician follow up in the next 48 hours. Strict return precautions discussed.   FINAL CLINICAL IMPRESSION(S) / ED DIAGNOSES   Final diagnoses:  Nausea vomiting and diarrhea  Generalized abdominal cramping   Rx / DC Orders   ED Discharge Orders          Ordered    ondansetron  (ZOFRAN-ODT) 8 MG disintegrating tablet  Every 8 hours PRN        09/12/22 1721           Note:  This document was prepared using Dragon voice recognition software and may include unintentional dictation errors.   Naaman Plummer, MD 09/12/22 [phone]

## 2022-09-13 NOTE — Telephone Encounter (Signed)
Requested Prescriptions  Pending Prescriptions Disp Refills   losartan (COZAAR) 50 MG tablet [Pharmacy Med Name: LOSARTAN POTASSIUM 50 MG TAB] 90 tablet 0    Sig: TAKE 1 TABLET BY MOUTH EVERY DAY     Cardiovascular:  Angiotensin Receptor Blockers Failed - 09/11/2022  2:48 PM      Failed - Cr in normal range and within 180 days    Creat  Date Value Ref Range Status  09/18/2013 0.67 0.50 - 1.10 mg/dL Final   Creatinine, Ser  Date Value Ref Range Status  09/12/2022 0.74 0.44 - 1.00 mg/dL Final         Failed - K in normal range and within 180 days    Potassium  Date Value Ref Range Status  09/12/2022 3.4 (L) 3.5 - 5.1 mmol/L Final         Failed - Last BP in normal range    BP Readings from Last 1 Encounters:  09/12/22 130/73         Passed - Patient is not pregnant      Passed - Valid encounter within last 6 months    Recent Outpatient Visits           4 months ago Irregular menses   [place] Gwyneth Sprout, FNP   11 months ago Encounter for annual physical exam   [place], Dionne Bucy, MD   11 months ago Seasonal allergic rhinitis, unspecified trigger   [place] Mikey Kirschner, PA-C   12 months ago Cough in adult patient   [facility] [place], [place], PA-C   1 year ago Upper respiratory tract infection, unspecified type   [facility] Mikey Kirschner, PA-C       Future Appointments             In 1 month Bacigalupo, Dionne Bucy, MD [facility] [place]

## 2022-10-13 NOTE — Progress Notes (Unsigned)
I,Harsha Yusko S Gustabo Gordillo,acting as a Neurosurgeon for Shirlee Latch, MD.,have documented all relevant documentation on the behalf of Shirlee Latch, MD,as directed by  Shirlee Latch, MD while in the presence of Shirlee Latch, MD.    Complete physical exam   Patient: Kelsey Hughes   DOB: 09-25-1980   42 y.o. Female  MRN: [id number] Visit Date: 10/14/2022  Today's healthcare provider: Shirlee Latch, MD   No chief complaint on file.  Subjective    Kelsey Hughes is a 42 y.o. female who presents today for a complete physical exam.  She reports consuming a {diet types:17450} diet. {Exercise:19826} She generally feels {well/fairly well/poorly:18703}. She reports sleeping {well/fairly well/poorly:18703}. She {does/does not:200015} have additional problems to discuss today.  HPI    Past Medical History:  Diagnosis Date   Allergy    Anemia    Cutaneous skin tags    Infertility, female    Past Surgical History:  Procedure Laterality Date   ANTERIOR CRUCIATE LIGAMENT REPAIR     DILATION AND CURETTAGE, DIAGNOSTIC / THERAPEUTIC     Social History   Socioeconomic History   Marital status: Single    Spouse name: Not on file   Number of children: Not on file   Years of education: Not on file   Highest education level: Not on file  Occupational History   Occupation: Unemployed  Tobacco Use   Smoking status: Never   Smokeless tobacco: Never  Vaping Use   Vaping Use: Never used  Substance and Sexual Activity   Alcohol use: Yes    Comment: occasional, not every week   Drug use: No   Sexual activity: Yes    Partners: Male    Birth control/protection: None  Other Topics Concern   Not on file  Social History Narrative   Not on file   Social Determinants of Health   Financial Resource Strain: Not on file  Food Insecurity: Not on file  Transportation Needs: Not on file  Physical Activity: Not on file  Stress: Not on file  Social Connections: Not on file  Intimate  Partner Violence: Not on file   Family Status  Relation Name Status   Father  Deceased   Mother  Alive   Mat Aunt  (Not Specified)   Nurse, mental health  (Not Specified)   Neg Hx  (Not Specified)   Family History  Problem Relation Age of Onset   Hypertension Father    Heart disease Father    Stroke Father    Hypertension Mother    Atrial fibrillation Mother    Sleep apnea Mother    Diabetes Maternal Aunt    Diabetes Maternal Uncle    Colon cancer Neg Hx    Breast cancer Neg Hx    Allergies  Allergen Reactions   Lisinopril Cough   Sulfonamide Derivatives     REACTION: ??? reaction during childhood    Patient Care Team: Erasmo Downer, MD as PCP - General (Family Medicine)   Medications: Outpatient Medications Prior to Visit  Medication Sig   albuterol (VENTOLIN HFA) 108 (90 Base) MCG/ACT inhaler Inhale 2 puffs into the lungs every 6 (six) hours as needed for wheezing or shortness of breath.   azelastine (ASTELIN) 0.1 % nasal spray Place 2 sprays into both nostrils 2 (two) times daily. Use in each nostril as directed   benzonatate (TESSALON) 100 MG capsule Take 1 capsule (100 mg total) by mouth 3 (three) times daily as needed for cough.  cholecalciferol (VITAMIN D3) 25 MCG (1000 UT) tablet Take 1,000 Units by mouth daily.   clotrimazole-betamethasone (LOTRISONE) cream Apply 1 application topically 2 (two) times daily.   fluticasone (FLONASE) 50 MCG/ACT nasal spray Place 2 sprays into both nostrils daily.   ipratropium (ATROVENT) 0.03 % nasal spray Place 2 sprays into both nostrils every 12 (twelve) hours.   loratadine (CLARITIN) 10 MG tablet Take 1 tablet (10 mg total) by mouth daily.   losartan (COZAAR) 50 MG tablet TAKE 1 TABLET BY MOUTH EVERY DAY   meclizine (ANTIVERT) 25 MG tablet Take 1 tablet (25 mg total) by mouth 3 (three) times daily as needed for dizziness.   Multiple Vitamin (MULTIVITAMIN) tablet Take 1 tablet by mouth daily.   Omega-3 Fatty Acids (FISH OIL PO)  Take by mouth.   omeprazole (PRILOSEC) 20 MG capsule TAKE 1 CAPSULE BY MOUTH EVERY DAY   ondansetron (ZOFRAN-ODT) 8 MG disintegrating tablet Take 1 tablet (8 mg total) by mouth every 8 (eight) hours as needed for nausea or vomiting.   vitamin B-12 (CYANOCOBALAMIN) 1000 MCG tablet Take 1,000 mcg by mouth daily.   No facility-administered medications prior to visit.    Review of Systems  All other systems reviewed and are negative.   Last CBC Lab Results  Component Value Date   WBC 10.8 (H) 09/12/2022   HGB 12.0 09/12/2022   HCT 37.7 09/12/2022   MCV 93.3 09/12/2022   MCH 29.7 09/12/2022   RDW 14.5 09/12/2022   PLT 334 09/12/2022   Last metabolic panel Lab Results  Component Value Date   GLUCOSE 108 (H) 09/12/2022   NA 135 09/12/2022   K 3.4 (L) 09/12/2022   CL 109 09/12/2022   CO2 22 09/12/2022   BUN 15 09/12/2022   CREATININE 0.74 09/12/2022   GFRNONAA >60 09/12/2022   CALCIUM 8.2 (L) 09/12/2022   PROT 6.9 09/12/2022   ALBUMIN 3.5 09/12/2022   LABGLOB 2.5 10/06/2021   AGRATIO 1.7 10/06/2021   BILITOT 0.7 09/12/2022   ALKPHOS 55 09/12/2022   AST 18 09/12/2022   ALT 17 09/12/2022   ANIONGAP 4 (L) 09/12/2022   Last lipids Lab Results  Component Value Date   CHOL 212 (H) 10/06/2021   HDL 45 10/06/2021   LDLCALC 149 (H) 10/06/2021   LDLDIRECT 130 (H) 09/18/2013   TRIG 100 10/06/2021   CHOLHDL 4.7 (H) 10/06/2021   Last hemoglobin A1c Lab Results  Component Value Date   HGBA1C 6.1 (H) 05/11/2022   Last thyroid functions Lab Results  Component Value Date   TSH 1.840 05/11/2022   Last vitamin D Lab Results  Component Value Date   VD25OH 47.2 10/06/2021   Last vitamin B12 and Folate Lab Results  Component Value Date   VITAMINB12 310 10/06/2021      Objective    There were no vitals taken for this visit. BP Readings from Last 3 Encounters:  09/12/22 130/73  06/04/22 (!) 140/80  05/11/22 120/69   Wt Readings from Last 3 Encounters:  06/04/22  283 lb 4.8 oz (128.5 kg)  05/11/22 279 lb (126.6 kg)  05/04/22 270 lb (122.5 kg)       Physical Exam  ***  Last depression screening scores    05/11/2022    4:06 PM 10/06/2021    9:51 AM 08/03/2021    9:01 AM  PHQ 2/9 Scores  PHQ - 2 Score 0 0 0  PHQ- 9 Score Last fall risk screening  05/11/2022    4:05 PM  Fall Risk   Falls in the past year? 0  Number falls in past yr: 0  Injury with Fall? 0  Risk for fall due to : No Fall Risks  Follow up Falls evaluation completed   Last Audit-C alcohol use screening    05/11/2022    4:06 PM  Alcohol Use Disorder Test (AUDIT)  1. How often do you have a drink containing alcohol? 2  2. How many drinks containing alcohol do you have on a typical day when you are drinking? 0  3. How often do you have six or more drinks on one occasion? 0  AUDIT-C Score 2   A score of 3 or more in women, and 4 or more in men indicates increased risk for alcohol abuse, EXCEPT if all of the points are from question 1   No results found for any visits on 10/14/22.  Assessment & Plan    Routine Health Maintenance and Physical Exam  Exercise Activities and Dietary recommendations  Goals   None     Immunization History  Administered Date(s) Administered   Influenza,inj,Quad PF,6+ Mos 02/09/2019, 03/10/2020, 04/07/2021   PFIZER(Purple Top)SARS-COV-2 Vaccination 10/25/2019, 11/20/2019    Health Maintenance  Topic Date Due   DTaP/Tdap/Td (1 - Tdap) Never done   COVID-19 Vaccine (3 - 2023-24 season) 02/19/2022   INFLUENZA VACCINE  01/20/2023   PAP SMEAR-Modifier  10/07/2026   Hepatitis C Screening  Completed   HIV Screening  Completed   HPV VACCINES  Aged Out    Discussed health benefits of physical activity, and encouraged her to engage in regular exercise appropriate for her age and condition.  ***  No follow-ups on file.     {provider attestation***:1}   Shirlee Latch, MD  [place] [facility] [phone] (phone) [phone] (fax)  [facility]

## 2022-10-14 ENCOUNTER — Ambulatory Visit (INDEPENDENT_AMBULATORY_CARE_PROVIDER_SITE_OTHER): Payer: Self-pay | Admitting: Family Medicine

## 2022-10-14 ENCOUNTER — Encounter: Payer: Self-pay | Admitting: Family Medicine

## 2022-10-14 VITALS — BP 128/82 | HR 78 | Temp 97.8°F | Resp 12 | Ht 66.0 in | Wt 282.0 lb

## 2022-10-14 DIAGNOSIS — E559 Vitamin D deficiency, unspecified: Secondary | ICD-10-CM

## 2022-10-14 DIAGNOSIS — R7303 Prediabetes: Secondary | ICD-10-CM

## 2022-10-14 DIAGNOSIS — I10 Essential (primary) hypertension: Secondary | ICD-10-CM

## 2022-10-14 DIAGNOSIS — E782 Mixed hyperlipidemia: Secondary | ICD-10-CM

## 2022-10-14 DIAGNOSIS — E538 Deficiency of other specified B group vitamins: Secondary | ICD-10-CM

## 2022-10-14 DIAGNOSIS — Z0000 Encounter for general adult medical examination without abnormal findings: Secondary | ICD-10-CM

## 2022-10-14 NOTE — Assessment & Plan Note (Signed)
Discussed importance of healthy weight management Discussed diet and exercise  BMI >45

## 2022-10-14 NOTE — Assessment & Plan Note (Signed)
Reviewed last lipid panel Not currently on a statin Recheck FLP and CMP Discussed diet and exercise  

## 2022-10-14 NOTE — Assessment & Plan Note (Signed)
Recommend low carb diet °Recheck A1c  °

## 2022-10-14 NOTE — Assessment & Plan Note (Signed)
Well controlled Continue current medications Recheck metabolic panel F/u in 6 months  

## 2022-10-15 LAB — COMPREHENSIVE METABOLIC PANEL
ALT: 19 IU/L (ref 0–32)
AST: 16 IU/L (ref 0–40)
Albumin/Globulin Ratio: 1.6 (ref 1.2–2.2)
Albumin: 4 g/dL (ref 3.9–4.9)
Alkaline Phosphatase: 71 IU/L (ref 44–121)
BUN/Creatinine Ratio: 18 (ref 9–23)
BUN: 13 mg/dL (ref 6–24)
Bilirubin Total: <0.2 mg/dL (ref 0.0–1.2)
CO2: 21 mmol/L (ref 20–29)
Calcium: 9.4 mg/dL (ref 8.7–10.2)
Chloride: 103 mmol/L (ref 96–106)
Creatinine, Ser: 0.73 mg/dL (ref 0.57–1.00)
Globulin, Total: 2.5 g/dL (ref 1.5–4.5)
Glucose: 87 mg/dL (ref 70–99)
Potassium: 4.5 mmol/L (ref 3.5–5.2)
Sodium: 140 mmol/L (ref 134–144)
Total Protein: 6.5 g/dL (ref 6.0–8.5)
eGFR: 104 mL/min/{1.73_m2} (ref >59)

## 2022-10-15 LAB — HEMOGLOBIN A1C
Est. average glucose Bld gHb Est-mCnc: 126 mg/dL
Hgb A1c MFr Bld: 6 % — ABNORMAL HIGH (ref 4.8–5.6)

## 2022-10-15 LAB — LIPID PANEL WITH LDL/HDL RATIO
Cholesterol, Total: 199 mg/dL (ref 100–199)
HDL: 44 mg/dL (ref >39)
LDL Chol Calc (NIH): 133 mg/dL — ABNORMAL HIGH (ref 0–99)
LDL/HDL Ratio: 3 ratio (ref 0.0–3.2)
Triglycerides: 123 mg/dL (ref 0–149)
VLDL Cholesterol Cal: 22 mg/dL (ref 5–40)

## 2023-01-14 ENCOUNTER — Other Ambulatory Visit: Payer: Self-pay | Admitting: Family Medicine

## 2023-01-14 NOTE — Telephone Encounter (Signed)
Requested Prescriptions  Pending Prescriptions Disp Refills   losartan (COZAAR) 50 MG tablet [Pharmacy Med Name: LOSARTAN POTASSIUM 50 MG TAB] 90 tablet 0    Sig: TAKE 1 TABLET BY MOUTH EVERY DAY     Cardiovascular:  Angiotensin Receptor Blockers Passed - 01/14/2023  2:24 AM      Passed - Cr in normal range and within 180 days    Creat  Date Value Ref Range Status  09/18/2013 0.67 0.50 - 1.10 mg/dL Final   Creatinine, Ser  Date Value Ref Range Status  10/14/2022 0.73 0.57 - 1.00 mg/dL Final         Passed - K in normal range and within 180 days    Potassium  Date Value Ref Range Status  10/14/2022 4.5 3.5 - 5.2 mmol/L Final         Passed - Patient is not pregnant      Passed - Last BP in normal range    BP Readings from Last 1 Encounters:  10/14/22 128/82         Passed - Valid encounter within last 6 months    Recent Outpatient Visits           3 months ago Encounter for annual physical exam   [place] [facility] [place], Marzella Schlein, MD   8 months ago Irregular menses   [facility] Merita Norton T, FNP   1 year ago Encounter for annual physical exam   [facility] Bery[facility] Flock, Marzella Schlein, MD   1 year ago Seasonal allergic rhinitis, unspecified trigger   [place] [facility] Alfredia Ferguson, PA-C   1 year ago Cough in adult patient   [facility] [place], [place], [place]

## 2023-03-21 ENCOUNTER — Emergency Department
Admission: EM | Admit: 2023-03-21 | Discharge: 2023-03-21 | Disposition: A | Discharge status: Home / Self Care | Payer: Medicaid Other | Attending: Emergency Medicine | Admitting: Emergency Medicine

## 2023-03-21 ENCOUNTER — Other Ambulatory Visit: Payer: Self-pay

## 2023-03-21 ENCOUNTER — Emergency Department: Payer: Medicaid Other

## 2023-03-21 DIAGNOSIS — R109 Unspecified abdominal pain: Secondary | ICD-10-CM

## 2023-03-21 DIAGNOSIS — N201 Calculus of ureter: Secondary | ICD-10-CM | POA: Insufficient documentation

## 2023-03-21 DIAGNOSIS — N200 Calculus of kidney: Secondary | ICD-10-CM

## 2023-03-21 LAB — URINALYSIS, ROUTINE W REFLEX MICROSCOPIC
Bilirubin Urine: NEGATIVE
Glucose, UA: NEGATIVE mg/dL
Ketones, ur: 5 mg/dL — AB
Leukocytes,Ua: NEGATIVE
Nitrite: NEGATIVE
Protein, ur: NEGATIVE mg/dL
RBC / HPF: >50 RBC/hpf (ref 0–5)
Specific Gravity, Urine: 1.017 (ref 1.005–1.030)
pH: 6 (ref 5.0–8.0)

## 2023-03-21 LAB — PREGNANCY, URINE: Preg Test, Ur: NEGATIVE

## 2023-03-21 MED ORDER — KETOROLAC TROMETHAMINE 30 MG/ML IJ SOLN
30.0000 mg | Freq: Once | INTRAMUSCULAR | Status: AC
  Administered 2023-03-21: 30 mg via INTRAMUSCULAR
  Filled 2023-03-21: qty 1

## 2023-03-21 MED ORDER — ONDANSETRON 4 MG PO TBDP: 4.0000 mg | ORAL_TABLET | Freq: Three times a day (TID) | ORAL | 0 refills | Status: DC | PRN

## 2023-03-21 MED ORDER — CEPHALEXIN 500 MG PO CAPS: 500.0000 mg | ORAL_CAPSULE | Freq: Two times a day (BID) | ORAL | 0 refills | Status: AC

## 2023-03-21 MED ORDER — FLUCONAZOLE 100 MG PO TABS: 100.0000 mg | ORAL_TABLET | Freq: Every day | ORAL | 0 refills | Status: AC

## 2023-03-21 NOTE — ED Notes (Signed)
See triage notes. Patient stated that she began having left sided back pain this morning. Patient denies injury, urinary or bowel issues.

## 2023-03-21 NOTE — ED Triage Notes (Signed)
R sided flank pain radiating to groin x today.

## 2023-03-21 NOTE — ED Provider Notes (Signed)
[facility] Emergency Department Provider Note     Event Date/Time   First MD Initiated Contact with Patient 03/21/23 1411     (approximate)   History   Flank Pain   HPI  Kelsey Hughes is a 42 y.o. female who presents to the ED with complaint of left-sided flank pain that began earlier today.  Pain is described as constant that radiates to her groin.  At this time pain is  4/10.  Patient reports laying down improves pain. Endorse nausea. Denies fever      Physical Exam   Triage Vital Signs: ED Triage Vitals  Encounter Vitals Group     BP 03/21/23 1323 (!) 140/80     Systolic BP Percentile --      Diastolic BP Percentile --      Pulse Rate 03/21/23 1323 (!) 106     Resp 03/21/23 1323 20     Temp 03/21/23 1323 97.8 F (36.6 C)     Temp Source 03/21/23 1323 Oral     SpO2 03/21/23 1323 94 %     Weight 03/21/23 1324 275 lb (124.7 kg)     Height 03/21/23 1324 5\' 6"  (1.676 m)     Head Circumference --      Peak Flow --      Pain Score 03/21/23 1324 7     Pain Loc --      Pain Education --      Exclude from Growth Chart --     Most recent vital signs: Vitals:   03/21/23 1323 03/21/23 1858  BP: (!) 140/80 138/88  Pulse: (!) 106 90  Resp: 20 20  Temp: 97.8 F (36.6 C) 97.7 F (36.5 C)  SpO2: 94% 96%    General: Alert and oriented. INAD.  Skin:  Warm, dry and intact. No rashes or lesions noted.     Head:  NCAT.  Eyes:  PERRLA. EOMI.  CV:  Good peripheral perfusion. RRR. No peripheral edema.  RESP:  Normal effort. LCTAB. No retractions.  ABD:  No distention. Soft, Non tender. No masses or organomegaly. CVA tenderness bilaterally.  BACK:  Spinous process is midline without deformity or tenderness. MSK:   Full ROM in all joints. No swelling, deformity or tenderness.  NEURO: Cranial nerves intact. No focal deficits. Sensation and motor function intact. 5/5 muscle strength of UE & LE. Gait is steady.    ED Results / Procedures /  Treatments   Labs (all labs ordered are listed, but only abnormal results are displayed) Labs Reviewed  URINALYSIS, ROUTINE W REFLEX MICROSCOPIC - Abnormal; Notable for the following components:      Result Value   Color, Urine YELLOW (*)    APPearance HAZY (*)    Hgb urine dipstick LARGE (*)    Ketones, ur 5 (*)    Bacteria, UA RARE (*)    All other components within normal limits  PREGNANCY, URINE    RADIOLOGY  I personally viewed and evaluated these images as part of my medical decision making, as well as reviewing the written report by the radiologist.  ED Provider Interpretation: There appears to be a small stone in the left ureter  CT Renal Stone Study  Result Date: 03/21/2023 CLINICAL DATA:  Abdominal pain, flank pain EXAM: CT ABDOMEN AND PELVIS WITHOUT CONTRAST TECHNIQUE: Multidetector CT imaging of the abdomen and pelvis was performed following the standard protocol without IV contrast. RADIATION DOSE REDUCTION: This exam was performed according  to the departmental dose-optimization program which includes automated exposure control, adjustment of the mA and/or kV according to patient size and/or use of iterative reconstruction technique. COMPARISON:  None Available. FINDINGS: Lower chest: No acute abnormality.  Moderate-sized hiatal hernia. Hepatobiliary: No focal hepatic abnormality. Gallbladder unremarkable. Pancreas: No focal abnormality or ductal dilatation. Spleen: No focal abnormality.  Normal size. Adrenals/Urinary Tract: 2 mm proximal left ureteral stone with slight fullness of the left renal collecting system and ureter. No stones or hydronephrosis on the right. No renal or adrenal mass. Urinary bladder unremarkable. Stomach/Bowel: Normal appendix. Stomach, large and small bowel grossly unremarkable. Vascular/Lymphatic: No evidence of aneurysm or adenopathy. Reproductive: Uterus and adnexa unremarkable.  No mass. Other: No free fluid or free air. Musculoskeletal: No acute  bony abnormality. IMPRESSION: 2 mm proximal left ureteral stone. Slight fullness of the left renal collecting system and proximal ureter. Electronically Signed   By: Charlett Nose M.D.   On: 03/21/2023 18:33    PROCEDURES:  Critical Care performed: No  Procedures   MEDICATIONS ORDERED IN ED: Medications  ketorolac (TORADOL) 30 MG/ML injection 30 mg (30 mg Intramuscular Given 03/21/23 1525)     IMPRESSION / MDM / ASSESSMENT AND PLAN / ED COURSE  I reviewed the triage vital signs and the nursing notes.                              Clinical Course as of 03/21/23 1959  Mon Mar 21, 2023  1713 Patient's pain has improved with Toradol. [MH]    Clinical Course User Index [MH] Kern Reap A, PA-C   42 y.o. female presents to the emergency department for evaluation and treatment of acute flank pain. See HPI for further details.   Differential diagnosis includes, but is not limited to nephrolithiasis, pyelonephritis, appendicitis.  Patient's presentation is most consistent with acute complicated illness / injury requiring diagnostic workup.  Patient is alert and oriented.  Hemodynamically stable.  Urinalysis revealed large hemoglobin and bacteria.  Will treat with antibiotics.  Given physical exam findings as stated above, CT renal study obtained.  There is a 2 mm proximal left ureteral stone.  Patient is in no pain at this time.  Education provided to maintain hydration and drink plenty of fluids to flush out kidney stone.  Patient is to follow-up with urology for further evaluation. ED precautions discussed.    FINAL CLINICAL IMPRESSION(S) / ED DIAGNOSES   Final diagnoses:  Flank pain  Nephrolithiasis   Rx / DC Orders   ED Discharge Orders          Ordered    cephALEXin (KEFLEX) 500 MG capsule  2 times daily        03/21/23 1851    fluconazole (DIFLUCAN) 100 MG tablet  Daily        03/21/23 1901    ondansetron (ZOFRAN-ODT) 4 MG disintegrating tablet  Every 8 hours PRN         03/21/23 1958           Note:  This document was prepared using Dragon voice recognition software and may include unintentional dictation errors.    Romeo Apple, Holli Rengel A, PA-C 03/21/23 Vesta Mixer, MD 03/23/23 [phone]

## 2023-03-21 NOTE — Discharge Instructions (Addendum)
Your evaluated in the ED for left-sided flank pain.  Your urinalysis revealed small amount of bacteria and blood.  CT renal stone study reveals a 2 mm kidney stone on your left side.  You will need to stay hydrated and follow-up with urology if symptoms do not improve.  This stone should be passed if you increase your fluid uptake

## 2023-04-24 ENCOUNTER — Other Ambulatory Visit: Payer: Self-pay | Admitting: Family Medicine

## 2023-07-31 ENCOUNTER — Other Ambulatory Visit: Payer: Self-pay | Admitting: Family Medicine

## 2023-08-01 NOTE — Telephone Encounter (Signed)
 Attempted to call patient to schedule appointment- left message to call office- courtesy 30 day Rx provided Requested Prescriptions  Pending Prescriptions Disp Refills   losartan  (COZAAR ) 50 MG tablet [Pharmacy Med Name: LOSARTAN  POTASSIUM 50 MG TAB] 90 tablet 0    Sig: TAKE 1 TABLET BY MOUTH EVERY DAY     Cardiovascular:  Angiotensin Receptor Blockers Failed - 08/01/2023  2:37 PM      Failed - Cr in normal range and within 180 days    Creat  Date Value Ref Range Status  09/18/2013 0.67 0.50 - 1.10 mg/dL Final   Creatinine, Ser  Date Value Ref Range Status  10/14/2022 0.73 0.57 - 1.00 mg/dL Final         Failed - K in normal range and within 180 days    Potassium  Date Value Ref Range Status  10/14/2022 4.5 3.5 - 5.2 mmol/L Final         Failed - Valid encounter within last 6 months    Recent Outpatient Visits           9 months ago Encounter for annual physical exam   [place] [facility] [place], Stan Eans, MD   1 year ago Irregular menses   [place] [facility] Normie Becton, FNP   1 year ago Encounter for annual physical exam   [facility] [place], Stan Eans, MD   1 year ago Seasonal allergic rhinitis, unspecified trigger   [place] [facility] Trenton Frock, PA-C   1 year ago Cough in adult patient   [facility] [place], Janna, PA-C              Passed - Patient is not pregnant      Passed - Last BP in normal range    BP Readings from Last 1 Encounters:  03/21/23 138/88

## 2023-09-03 ENCOUNTER — Other Ambulatory Visit: Payer: Self-pay | Admitting: Family Medicine

## 2023-09-05 ENCOUNTER — Other Ambulatory Visit: Payer: Self-pay | Admitting: Family Medicine

## 2023-09-05 NOTE — Telephone Encounter (Signed)
 Patient has had courtesy refill- request denied Requested Prescriptions  Pending Prescriptions Disp Refills   losartan (COZAAR) 50 MG tablet [Pharmacy Med Name: LOSARTAN POTASSIUM 50 MG TAB] 30 tablet 0    Sig: TAKE 1 TABLET BY MOUTH EVERY DAY     Cardiovascular:  Angiotensin Receptor Blockers Failed - 09/05/2023  3:30 PM      Failed - Cr in normal range and within 180 days    Creat  Date Value Ref Range Status  09/18/2013 0.67 0.50 - 1.10 mg/dL Final   Creatinine, Ser  Date Value Ref Range Status  10/14/2022 0.73 0.57 - 1.00 mg/dL Final         Failed - K in normal range and within 180 days    Potassium  Date Value Ref Range Status  10/14/2022 4.5 3.5 - 5.2 mmol/L Final         Failed - Valid encounter within last 6 months    Recent Outpatient Visits           10 months ago Encounter for annual physical exam   [facility] [place], Marzella Schlein, MD   1 year ago Irregular menses   [facility] Jacky Kindle, FNP   1 year ago Encounter for annual physical exam   [facility] Flock, Marzella Schlein, MD   1 year ago Seasonal allergic rhinitis, unspecified trigger   [place] [facility] Alfredia Ferguson, PA-C   1 year ago Cough in adult patient   [facility] [place], [place], [place]              Passed - Patient is not pregnant      Passed - Last BP in normal range    BP Readings from Last 1 Encounters:  03/21/23 138/88

## 2023-09-05 NOTE — Telephone Encounter (Signed)
 Copied from CRM [id number]. Topic: Clinical - Medication Refill >> Sep 05, 2023  4:48 PM [place] B wrote: Most Recent Primary Care Visit:  Provider: Erasmo Downer  Department: [id number]  Visit Type: PHYSICAL  Date: 10/14/2022  Medication: Losartan Potassium 50 mg Pt was denied for appt, she now has an appt scheduled for Apr 8 Has the patient contacted their pharmacy? yes pharmacy advise?)contact pcp  Is this the correct pharmacy for this prescription? yes This is the patient's preferred pharmacy:  [facility] [address], [place] - [address]  [place] [address] Phone: [phone] Fax: [phone]   Has the prescription been filled recently? no  Is the patient out of the medication? yes  Has the patient been seen for an appointment in the last year OR does the patient have an upcoming appointment? yes  Can we respond through MyChart? yes  Agent: Please be advised that Rx refills may take up to 3 business days. We ask that you follow-up with your pharmacy.

## 2023-09-05 NOTE — Telephone Encounter (Signed)
 Requested medication (s) are due for refill today: Yes  Requested medication (s) are on the active medication list: Yes  Last refill:  08/01/23  Future visit scheduled: Yes  Notes to clinic:  Unable to refill per protocol, courtesy refill already given, routing for provider approval.      Requested Prescriptions  Pending Prescriptions Disp Refills   losartan (COZAAR) 50 MG tablet 30 tablet 0    Sig: Take 1 tablet (50 mg total) by mouth daily.     Cardiovascular:  Angiotensin Receptor Blockers Failed - 09/05/2023  5:23 PM      Failed - Cr in normal range and within 180 days    Creat  Date Value Ref Range Status  09/18/2013 0.67 0.50 - 1.10 mg/dL Final   Creatinine, Ser  Date Value Ref Range Status  10/14/2022 0.73 0.57 - 1.00 mg/dL Final         Failed - K in normal range and within 180 days    Potassium  Date Value Ref Range Status  10/14/2022 4.5 3.5 - 5.2 mmol/L Final         Failed - Valid encounter within last 6 months    Recent Outpatient Visits           10 months ago Encounter for annual physical exam   [facility] [place], Marzella Schlein, MD   1 year ago Irregular menses   [place] Jacky Kindle, FNP   1 year ago Encounter for annual physical exam   [facility], Marzella Schlein, MD   1 year ago Seasonal allergic rhinitis, unspecified trigger   [place] [facility] Alfredia Ferguson, PA-C   1 year ago Cough in adult patient   [facility] [place], [place], [place]              Passed - Patient is not pregnant      Passed - Last BP in normal range    BP Readings from Last 1 Encounters:  03/21/23 138/88

## 2023-09-06 MED ORDER — LOSARTAN POTASSIUM 50 MG PO TABS: 50.0000 mg | ORAL_TABLET | Freq: Every day | ORAL | 0 refills | Status: DC

## 2023-09-27 ENCOUNTER — Encounter: Payer: Self-pay | Admitting: Family Medicine

## 2023-09-27 ENCOUNTER — Ambulatory Visit (INDEPENDENT_AMBULATORY_CARE_PROVIDER_SITE_OTHER): Payer: Self-pay | Admitting: Family Medicine

## 2023-09-27 VITALS — BP 146/94 | HR 85 | Wt 307.8 lb

## 2023-09-27 DIAGNOSIS — E782 Mixed hyperlipidemia: Secondary | ICD-10-CM

## 2023-09-27 DIAGNOSIS — Z6841 Body Mass Index (BMI) 40.0 and over, adult: Secondary | ICD-10-CM

## 2023-09-27 DIAGNOSIS — R7303 Prediabetes: Secondary | ICD-10-CM

## 2023-09-27 DIAGNOSIS — E559 Vitamin D deficiency, unspecified: Secondary | ICD-10-CM

## 2023-09-27 DIAGNOSIS — I10 Essential (primary) hypertension: Secondary | ICD-10-CM

## 2023-09-27 DIAGNOSIS — Z1329 Encounter for screening for other suspected endocrine disorder: Secondary | ICD-10-CM

## 2023-09-27 DIAGNOSIS — E538 Deficiency of other specified B group vitamins: Secondary | ICD-10-CM

## 2023-09-27 DIAGNOSIS — R635 Abnormal weight gain: Secondary | ICD-10-CM

## 2023-09-27 MED ORDER — LOSARTAN POTASSIUM 100 MG PO TABS: 100.0000 mg | ORAL_TABLET | Freq: Every day | ORAL | 1 refills | Status: DC

## 2023-09-27 NOTE — Progress Notes (Signed)
 Established patient visit   Patient: Kelsey Hughes   DOB: 04/08/1981   43 y.o. Female  MRN: [id number] Visit Date: 09/27/2023  Today's healthcare provider: Shirlee Latch, MD   Chief Complaint  Patient presents with   Medication Refill    Needs refill for losarton, bp medication   Hypertension    Pt reports putting on weight in the last 3 months, No change in diet or lifestyle,  feeling dizzy, leg edema, no energy   Subjective    HPI HPI     Medication Refill    Additional comments: Needs refill for losarton, bp medication        Hypertension    Additional comments: Pt reports putting on weight in the last 3 months, No change in diet or lifestyle,  feeling dizzy, leg edema, no energy      Last edited by Allayne Stack on 09/27/2023  2:32 PM.       Discussed the use of AI scribe software for clinical note transcription with the patient, who gave verbal consent to proceed.  History of Present Illness   The patient, with a history of hypertension, presents with a chief complaint of significant weight gain, up by approximately 32 pounds since the last visit. The patient reports associated symptoms of leg swelling, frequent dizzy spells, and elevated blood pressure. The patient also mentions stomach issues, characterized by constant discomfort and gurgling noises. The patient reports a lack of energy, difficulty sleeping, and increased stress due to work, which has led to increased consumption of sugary drinks and less physical activity. The patient has a gym membership but has not been utilizing it due to lack of energy. The patient is currently on losartan for hypertension and is compliant with the medication. The patient is also seeking insurance to cover additional medical tests and is concerned about the potential for pre-existing conditions.         Medications: Outpatient Medications Prior to Visit  Medication Sig Note   albuterol (VENTOLIN HFA) 108 (90 Base)  MCG/ACT inhaler Inhale 2 puffs into the lungs every 6 (six) hours as needed for wheezing or shortness of breath.    azelastine (ASTELIN) 0.1 % nasal spray Place 2 sprays into both nostrils 2 (two) times daily. Use in each nostril as directed    benzonatate (TESSALON) 100 MG capsule Take 1 capsule (100 mg total) by mouth 3 (three) times daily as needed for cough.    cholecalciferol (VITAMIN D3) 25 MCG (1000 UT) tablet Take 1,000 Units by mouth daily.    clotrimazole-betamethasone (LOTRISONE) cream Apply 1 application topically 2 (two) times daily.    fluticasone (FLONASE) 50 MCG/ACT nasal spray Place 2 sprays into both nostrils daily.    ipratropium (ATROVENT) 0.03 % nasal spray Place 2 sprays into both nostrils every 12 (twelve) hours.    loratadine (CLARITIN) 10 MG tablet Take 1 tablet (10 mg total) by mouth daily.    meclizine (ANTIVERT) 25 MG tablet Take 1 tablet (25 mg total) by mouth 3 (three) times daily as needed for dizziness.    Multiple Vitamin (MULTIVITAMIN) tablet Take 1 tablet by mouth daily.    Omega-3 Fatty Acids (FISH OIL PO) Take by mouth.    omeprazole (PRILOSEC) 20 MG capsule TAKE 1 CAPSULE BY MOUTH EVERY DAY    ondansetron (ZOFRAN-ODT) 4 MG disintegrating tablet Take 1 tablet (4 mg total) by mouth every 8 (eight) hours as needed.    vitamin B-12 (CYANOCOBALAMIN) 1000 MCG  tablet Take 1,000 mcg by mouth daily.    [DISCONTINUED] losartan (COZAAR) 50 MG tablet Take 1 tablet (50 mg total) by mouth daily. 09/27/2023: Needs refill   No facility-administered medications prior to visit.    Review of Systems     Objective    BP (!) 146/94 (BP Location: Left Arm, Patient Position: Sitting, Cuff Size: Large)   Pulse 85   Wt (!) 307 lb 12.8 oz (139.6 kg)   SpO2 98%   BMI 49.68 kg/m    Physical Exam Vitals reviewed.  Constitutional:      General: She is not in acute distress.    Appearance: Normal appearance. She is well-developed. She is not diaphoretic.  HENT:     Head:  Normocephalic and atraumatic.  Eyes:     General: No scleral icterus.    Conjunctiva/sclera: Conjunctivae normal.  Neck:     Thyroid: No thyromegaly.  Cardiovascular:     Rate and Rhythm: Normal rate and regular rhythm.     Heart sounds: Normal heart sounds. No murmur heard. Pulmonary:     Effort: Pulmonary effort is normal. No respiratory distress.     Breath sounds: Normal breath sounds. No wheezing, rhonchi or rales.  Musculoskeletal:     Cervical back: Neck supple.     Right lower leg: No edema.     Left lower leg: No edema.  Lymphadenopathy:     Cervical: No cervical adenopathy.  Skin:    General: Skin is warm and dry.     Findings: No rash.  Neurological:     Mental Status: She is alert and oriented to person, place, and time. Mental status is at baseline.  Psychiatric:        Mood and Affect: Mood normal.        Behavior: Behavior normal.      No results found for any visits on 09/27/23.  Assessment & Plan     Problem List Items Addressed This Visit       Cardiovascular and Mediastinum   Primary hypertension - Primary   Blood pressure elevated at 149/94 mmHg. Current medication is losartan 50 mg. Discussed potential need for increased dosage due to persistent hypertension and symptoms of dizziness and headaches. Explained that increasing losartan dosage to 100 mg daily is the next step in management. Advised to monitor blood pressure at home and report significant changes. - Increase losartan dosage to 100 mg daily. Instruct to take two 50 mg tablets until new prescription is filled. - Monitor blood pressure at home and report significant changes. - Schedule follow-up in two months to reassess blood pressure and medication efficacy.      Relevant Medications   losartan (COZAAR) 100 MG tablet   Other Relevant Orders   CBC with Differential/Platelet   Comprehensive metabolic panel with GFR     Other   Morbid obesity (HCC)   Significant weight gain of 25-32  pounds since last visit, now approximately 300 pounds. Reports leg swelling, dizziness, and dyspnea on exertion, likely related to weight gain. Contributing factors include high soda consumption and physical inactivity. She is motivated to lose weight and improve health. Discussed setting specific and measurable goals for weight loss, including reducing soda consumption and increasing physical activity. - Order comprehensive lab work including thyroid function, vitamin D, B12, kidney and liver function, A1c, and CBC to assess underlying causes and effects of weight gain. - Set goal to reduce soda consumption to four cans per day and replace with  water. - Encourage 10 minutes of physical activity per day, five days a week. - Reassess weight and symptoms in follow-up visit.      Relevant Orders   CBC with Differential/Platelet   Comprehensive metabolic panel with GFR   Vitamin B12   VITAMIN D 25 Hydroxy (Vit-D Deficiency, Fractures)   TSH   Lipid panel   Hemoglobin A1c   Prediabetes   Concern about progression to diabetes, especially given family history. High soda consumption noted as a risk factor for worsening glycemic control. Discussed the importance of lifestyle modifications to prevent progression to diabetes. - Include A1c in lab work to assess current glycemic control. - Encourage reduction in soda consumption and increase in water intake as part of lifestyle modification.      Relevant Orders   Hemoglobin A1c   B12 deficiency   Relevant Orders   CBC with Differential/Platelet   Vitamin B12   Avitaminosis D   Relevant Orders   VITAMIN D 25 Hydroxy (Vit-D Deficiency, Fractures)   Mixed hyperlipidemia   Reviewed last lipid panel Not currently on a statin Recheck FLP and CMP Discussed diet and exercise       Relevant Medications   losartan (COZAAR) 100 MG tablet   Other Relevant Orders   Lipid panel   Screening for thyroid disorder   Relevant Orders   TSH   Weight gain  finding   Relevant Orders   CBC with Differential/Platelet   TSH       General Health Maintenance Discussed need for cancer screenings, including colonoscopy, as she is now 43 years old. She is seeking insurance coverage to facilitate screenings and other healthcare needs. Informed her about the importance of obtaining insurance through the marketplace to access necessary healthcare services. - Advise her to pursue insurance options through the marketplace to facilitate access to necessary screenings and healthcare services.       Return in about 2 months (around 11/27/2023) for BP f/u.       Shirlee Latch, MD  [facility] [phone] (phone) [phone] (fax)  [facility]

## 2023-09-27 NOTE — Assessment & Plan Note (Signed)
 Reviewed last lipid panel Not currently on a statin Recheck FLP and CMP Discussed diet and exercise

## 2023-09-27 NOTE — Assessment & Plan Note (Signed)
 Significant weight gain of 25-32 pounds since last visit, now approximately 300 pounds. Reports leg swelling, dizziness, and dyspnea on exertion, likely related to weight gain. Contributing factors include high soda consumption and physical inactivity. She is motivated to lose weight and improve health. Discussed setting specific and measurable goals for weight loss, including reducing soda consumption and increasing physical activity. - Order comprehensive lab work including thyroid function, vitamin D, B12, kidney and liver function, A1c, and CBC to assess underlying causes and effects of weight gain. - Set goal to reduce soda consumption to four cans per day and replace with water. - Encourage 10 minutes of physical activity per day, five days a week. - Reassess weight and symptoms in follow-up visit.

## 2023-09-27 NOTE — Assessment & Plan Note (Signed)
 Concern about progression to diabetes, especially given family history. High soda consumption noted as a risk factor for worsening glycemic control. Discussed the importance of lifestyle modifications to prevent progression to diabetes. - Include A1c in lab work to assess current glycemic control. - Encourage reduction in soda consumption and increase in water intake as part of lifestyle modification.

## 2023-09-27 NOTE — Assessment & Plan Note (Signed)
 Blood pressure elevated at 149/94 mmHg. Current medication is losartan 50 mg. Discussed potential need for increased dosage due to persistent hypertension and symptoms of dizziness and headaches. Explained that increasing losartan dosage to 100 mg daily is the next step in management. Advised to monitor blood pressure at home and report significant changes. - Increase losartan dosage to 100 mg daily. Instruct to take two 50 mg tablets until new prescription is filled. - Monitor blood pressure at home and report significant changes. - Schedule follow-up in two months to reassess blood pressure and medication efficacy.

## 2023-09-28 LAB — LIPID PANEL
Chol/HDL Ratio: 3.6 ratio (ref 0.0–4.4)
Cholesterol, Total: 198 mg/dL (ref 100–199)
HDL: 55 mg/dL (ref >39)
LDL Chol Calc (NIH): 121 mg/dL — ABNORMAL HIGH (ref 0–99)
Triglycerides: 123 mg/dL (ref 0–149)
VLDL Cholesterol Cal: 22 mg/dL (ref 5–40)

## 2023-09-28 LAB — HEMOGLOBIN A1C
Est. average glucose Bld gHb Est-mCnc: 126 mg/dL
Hgb A1c MFr Bld: 6 % — ABNORMAL HIGH (ref 4.8–5.6)

## 2023-09-28 LAB — VITAMIN D 25 HYDROXY (VIT D DEFICIENCY, FRACTURES): Vit D, 25-Hydroxy: 42.4 ng/mL (ref 30.0–100.0)

## 2023-09-28 LAB — CBC WITH DIFFERENTIAL/PLATELET
Basophils Absolute: 0.1 10*3/uL (ref 0.0–0.2)
Basos: 1 %
EOS (ABSOLUTE): 0.2 10*3/uL (ref 0.0–0.4)
Eos: 3 %
Hematocrit: 37 % (ref 34.0–46.6)
Hemoglobin: 11.9 g/dL (ref 11.1–15.9)
Immature Grans (Abs): 0 10*3/uL (ref 0.0–0.1)
Immature Granulocytes: 0 %
Lymphocytes Absolute: 2.6 10*3/uL (ref 0.7–3.1)
Lymphs: 27 %
MCH: 29.6 pg (ref 26.6–33.0)
MCHC: 32.2 g/dL (ref 31.5–35.7)
MCV: 92 fL (ref 79–97)
Monocytes Absolute: 0.9 10*3/uL (ref 0.1–0.9)
Monocytes: 9 %
Neutrophils Absolute: 5.9 10*3/uL (ref 1.4–7.0)
Neutrophils: 60 %
Platelets: 430 10*3/uL (ref 150–450)
RBC: 4.02 x10E6/uL (ref 3.77–5.28)
RDW: 14.3 % (ref 11.7–15.4)
WBC: 9.7 10*3/uL (ref 3.4–10.8)

## 2023-09-28 LAB — COMPREHENSIVE METABOLIC PANEL WITH GFR
ALT: 15 IU/L (ref 0–32)
AST: 16 IU/L (ref 0–40)
Albumin: 4 g/dL (ref 3.9–4.9)
Alkaline Phosphatase: 76 IU/L (ref 44–121)
BUN/Creatinine Ratio: 20 (ref 9–23)
BUN: 12 mg/dL (ref 6–24)
Bilirubin Total: <0.2 mg/dL (ref 0.0–1.2)
CO2: 25 mmol/L (ref 20–29)
Calcium: 9.7 mg/dL (ref 8.7–10.2)
Chloride: 101 mmol/L (ref 96–106)
Creatinine, Ser: 0.59 mg/dL (ref 0.57–1.00)
Globulin, Total: 2.4 g/dL (ref 1.5–4.5)
Glucose: 94 mg/dL (ref 70–99)
Potassium: 4.3 mmol/L (ref 3.5–5.2)
Sodium: 137 mmol/L (ref 134–144)
Total Protein: 6.4 g/dL (ref 6.0–8.5)
eGFR: 113 mL/min/{1.73_m2} (ref >59)

## 2023-09-28 LAB — TSH: TSH: 2.65 u[IU]/mL (ref 0.450–4.500)

## 2023-09-28 LAB — VITAMIN B12: Vitamin B-12: 376 pg/mL (ref 232–1245)

## 2023-09-29 ENCOUNTER — Encounter: Payer: Self-pay | Admitting: Family Medicine

## 2023-10-05 ENCOUNTER — Other Ambulatory Visit: Payer: Self-pay | Admitting: Family Medicine

## 2023-11-21 IMAGING — CR DG CHEST 2V
1 series · 2 of 2 positions shown · non-contrast
Comparison: 08/28/2018

CLINICAL DATA: Cough, wheezing

EXAM:
CHEST - 2 VIEW

[Series 1: dg chest 2 view · 0.14mm/px · 2 of 2 slices shown]
[im 1/2]
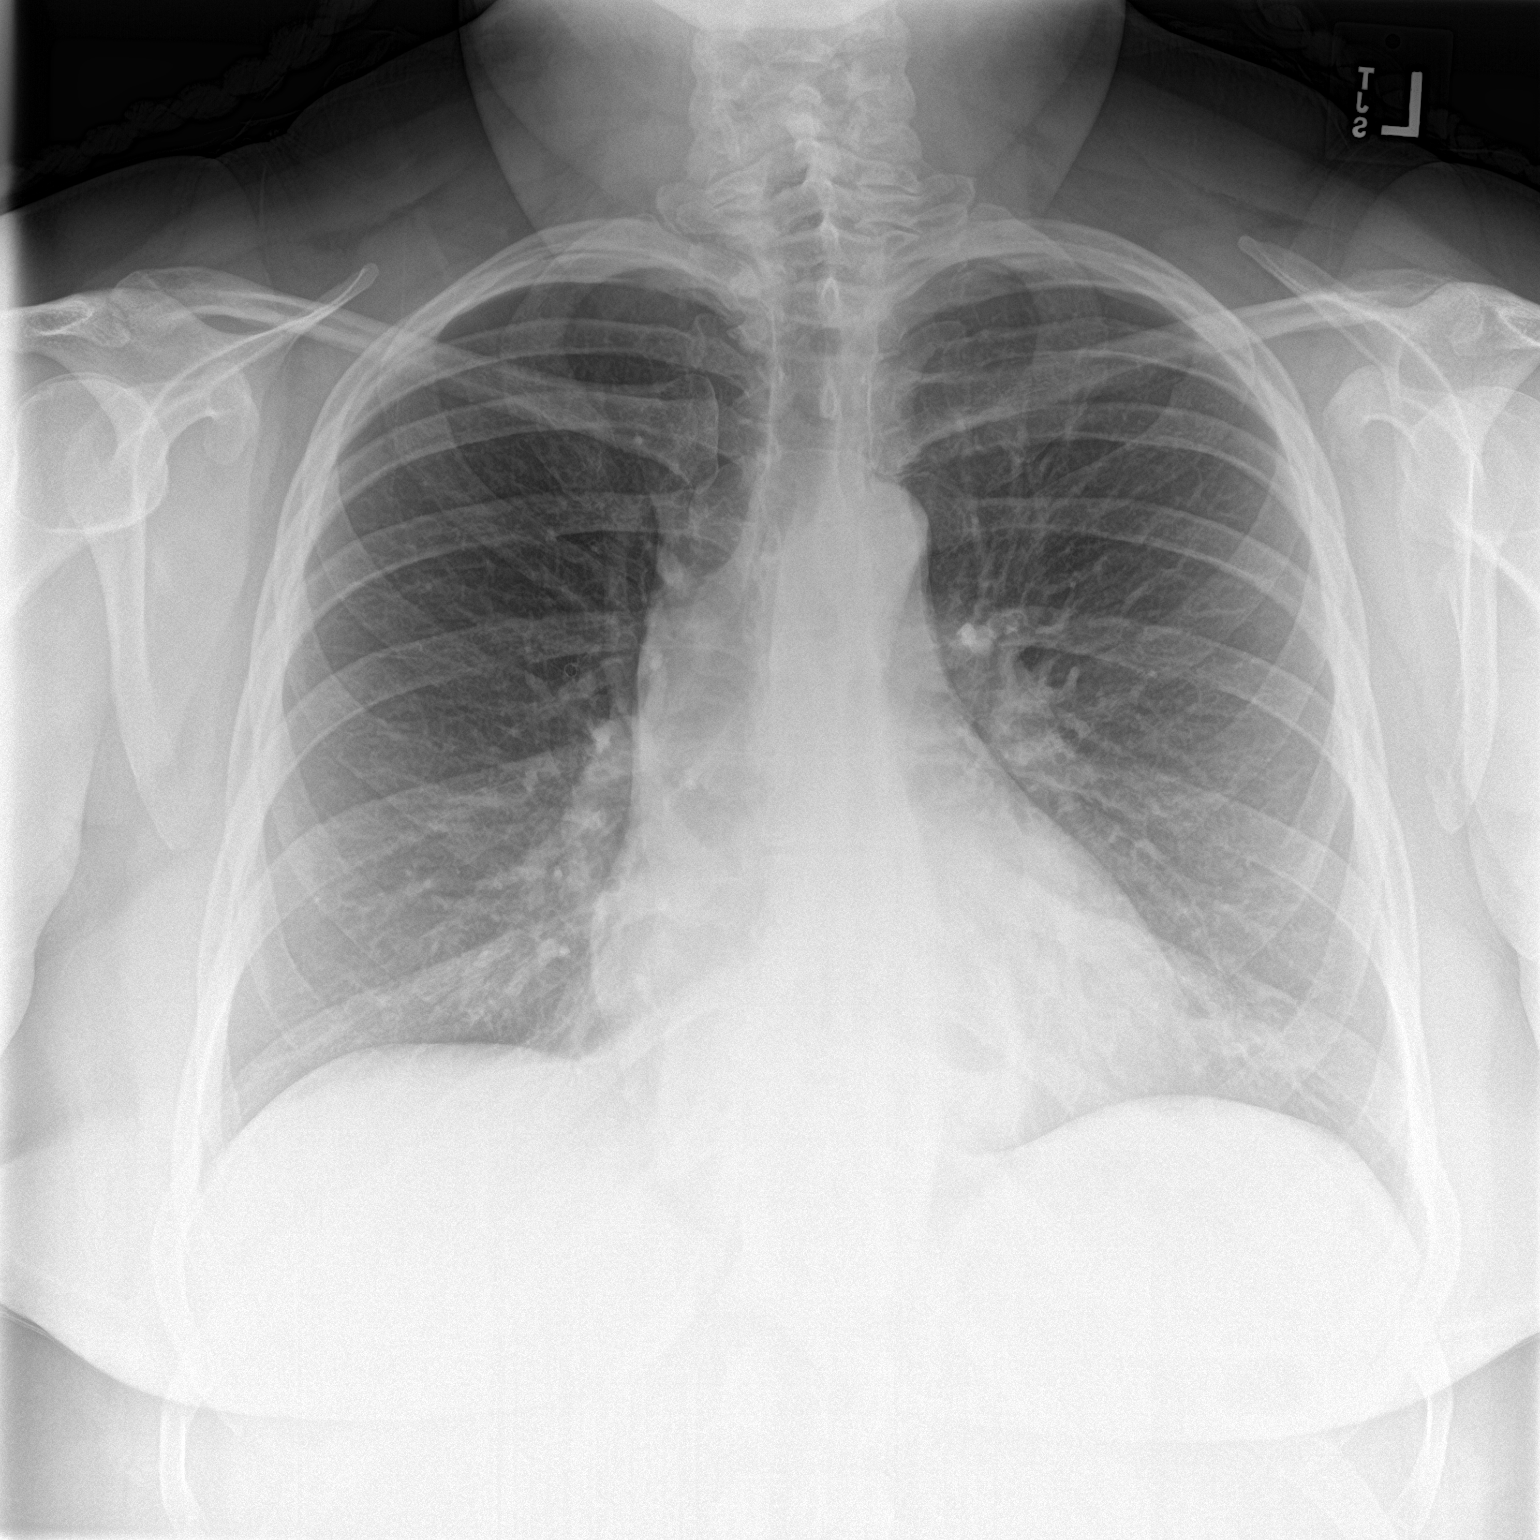
[im 2/2]
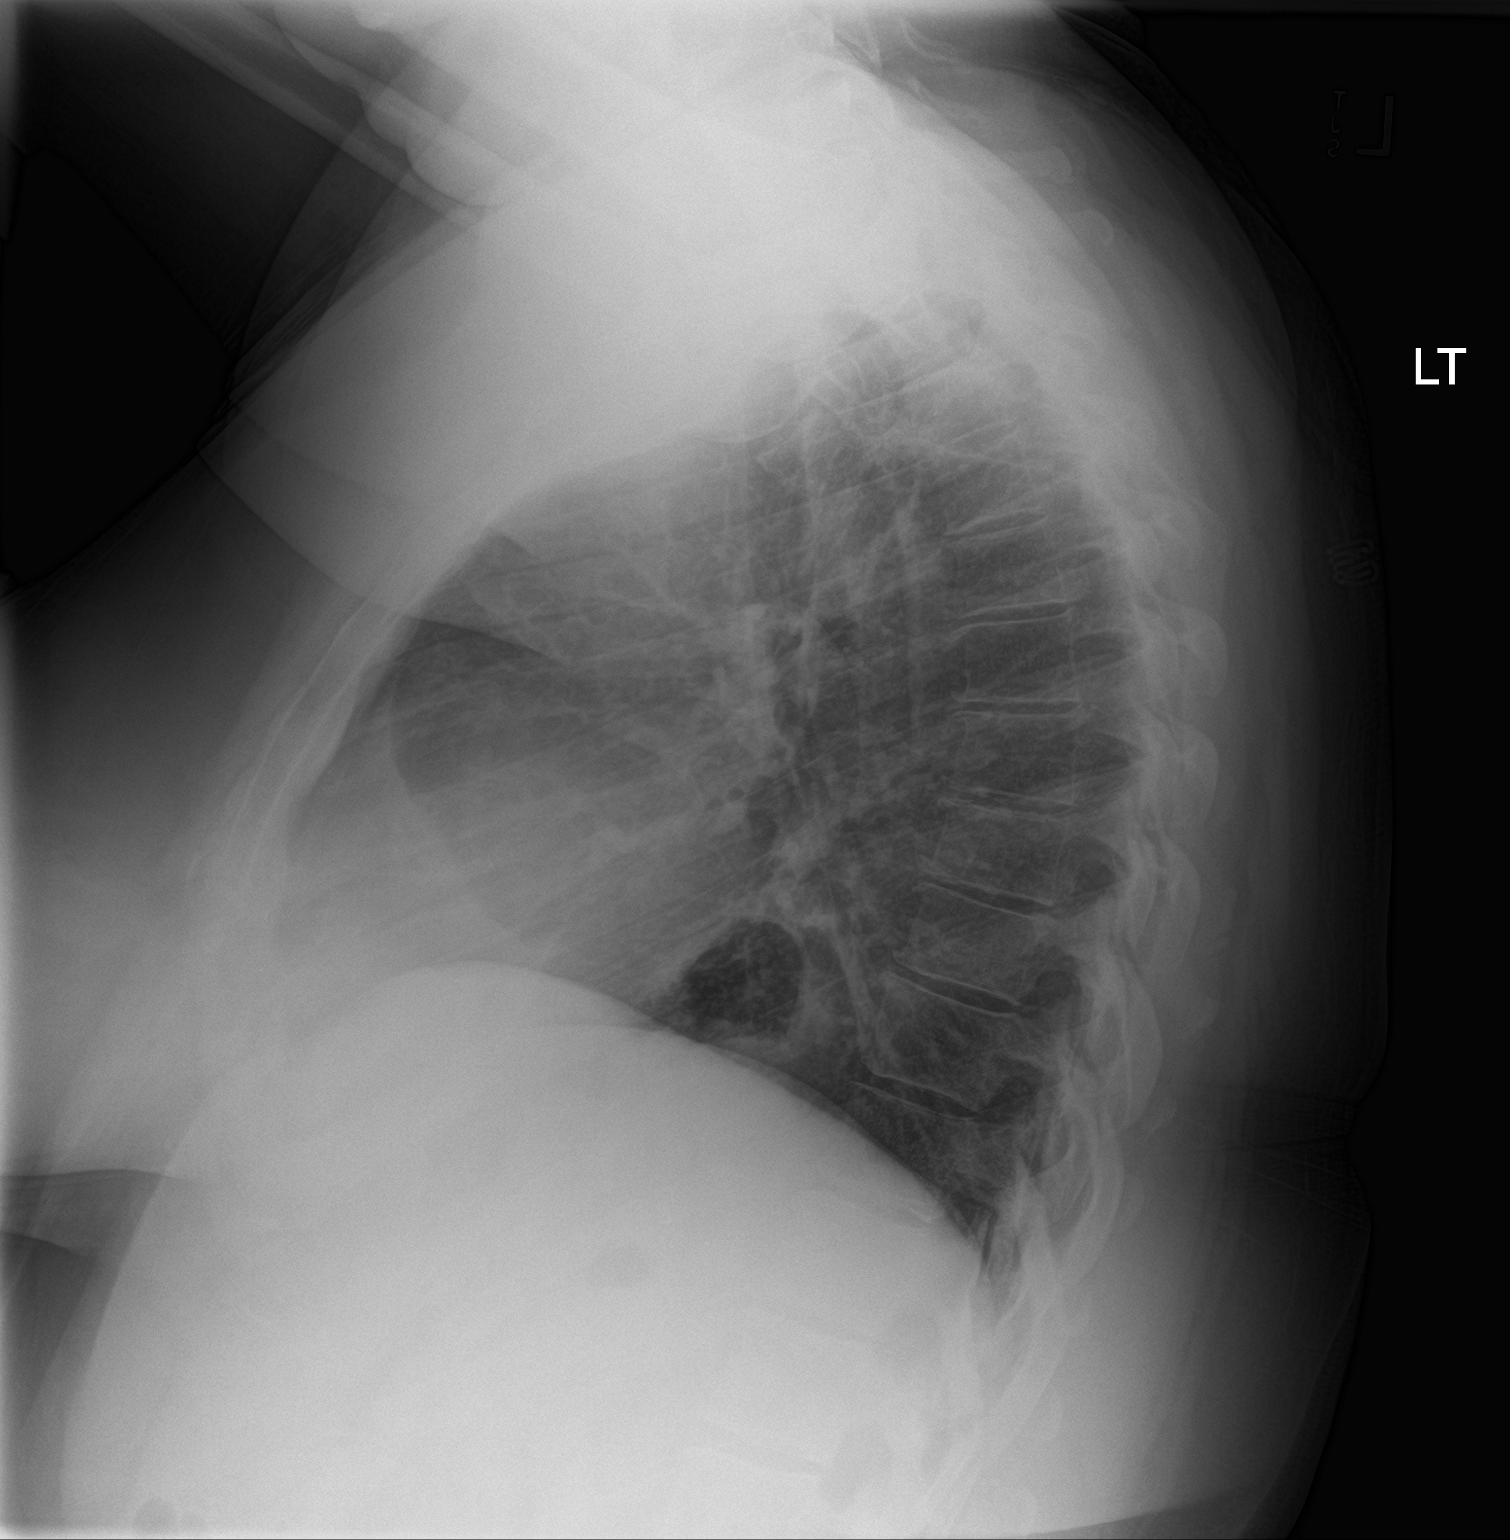

[2 of 2 positions shown; findings below may reference images not displayed]

FINDINGS: Transverse diameter of heart is within normal limits. There is
possible fixed hiatal hernia in the retrocardiac region. Lung fields
are clear of any pulmonary edema or new focal infiltrates. Left
lateral cardiophrenic angle is indistinct which has not changed.
Lateral CP angles are clear. There is no pneumothorax.
IMPRESSION: There are no signs of pulmonary edema or new focal infiltrates.
Fixed hiatal hernia with interval increase in size.

## 2023-11-29 ENCOUNTER — Encounter: Payer: Self-pay | Admitting: Family Medicine

## 2023-11-29 ENCOUNTER — Other Ambulatory Visit: Payer: Self-pay | Admitting: Family Medicine

## 2023-11-29 ENCOUNTER — Ambulatory Visit (INDEPENDENT_AMBULATORY_CARE_PROVIDER_SITE_OTHER): Payer: Self-pay | Admitting: Family Medicine

## 2023-11-29 VITALS — BP 140/105 | HR 80 | Ht 66.0 in | Wt 316.6 lb

## 2023-11-29 DIAGNOSIS — L918 Other hypertrophic disorders of the skin: Secondary | ICD-10-CM

## 2023-11-29 DIAGNOSIS — I10 Essential (primary) hypertension: Secondary | ICD-10-CM

## 2023-11-29 DIAGNOSIS — R29818 Other symptoms and signs involving the nervous system: Secondary | ICD-10-CM

## 2023-11-29 DIAGNOSIS — L309 Dermatitis, unspecified: Secondary | ICD-10-CM

## 2023-11-29 DIAGNOSIS — R7303 Prediabetes: Secondary | ICD-10-CM

## 2023-11-29 MED ORDER — HYDROCHLOROTHIAZIDE 25 MG PO TABS: 25.0000 mg | ORAL_TABLET | Freq: Every day | ORAL | 3 refills | Status: AC

## 2023-11-29 MED ORDER — LOSARTAN POTASSIUM 50 MG PO TABS: 50.0000 mg | ORAL_TABLET | Freq: Every day | ORAL | 1 refills | Status: DC

## 2023-11-29 MED ORDER — TOPIRAMATE 25 MG PO TABS: 25.0000 mg | ORAL_TABLET | Freq: Two times a day (BID) | ORAL | 2 refills | Status: DC

## 2023-11-29 MED ORDER — TRIAMCINOLONE ACETONIDE 0.5 % EX OINT: 1.0000 | TOPICAL_OINTMENT | Freq: Two times a day (BID) | CUTANEOUS | 0 refills | Status: DC

## 2023-11-29 NOTE — Progress Notes (Unsigned)
 Established patient visit   Patient: Kelsey Hughes   DOB: 08-Apr-1981   43 y.o. Female  MRN: [id number] Visit Date: 11/29/2023  Today's healthcare provider: Aden Agreste, MD   Chief Complaint  Patient presents with   Follow-up    Discuss weight loss because since April she has gained over 10 lbs.  She weighed 307 then and now she weighs 316.6.   Hypertension    Hypertension Patient is here for follow-up of elevated blood pressure. Trying to exercise getting out of breath going up 15 stairs and gasping for her breath. She is adherent to a low-salt diet. Reports that she does not check her blood pressure at home.. Reports that she is having fluid retention in her lower extremeties.  Also complains of having no desire to have sex since med was increased.  If she goes too long without eating reports that she starts feeling dizzy or almost feeling of passing out.   Subjective    Hypertension   HPI     Follow-up    Additional comments: Discuss weight loss because since April she has gained over 10 lbs.  She weighed 307 then and now she weighs 316.6.        Hypertension    Additional comments: Hypertension Patient is here for follow-up of elevated blood pressure. Trying to exercise getting out of breath going up 15 stairs and gasping for her breath. She is adherent to a low-salt diet. Reports that she does not check her blood pressure at home.. Reports that she is having fluid retention in her lower extremeties.  Also complains of having no desire to have sex since med was increased.  If she goes too long without eating reports that she starts feeling dizzy or almost feeling of passing out.      Last edited by Michaelene Admire L on 11/29/2023  3:19 PM.       Discussed the use of AI scribe software for clinical note transcription with the patient, who gave verbal consent to proceed.  History of Present Illness   Kelsey Hughes is a 43 year old female with hypertension who  presents for a blood pressure follow-up.  She experiences persistent hypertension despite increasing losartan  from 50 mg to 100 mg, which led to decreased libido. She has not reduced the dose. Over the past two months, she gained ten pounds and developed peripheral edema. Her physical activity has decreased due to exhaustion and lack of energy, feeling fatigued after minimal exertion. She snores and suspects sleep apnea, with disrupted sleep patterns partly due to her boyfriend's work schedule.  Her diet has improved with reduced soda intake and increased water consumption, but she continues to snack frequently, sometimes choosing sugary options. She experiences dizziness and jitteriness, which improve with eating. Her blood sugar levels have remained stable despite a history of prediabetes.  She reports irregular menstrual cycles, decreased natural lubrication, and reduced libido, which she attributes to medication changes. She has a dry patch on her elbow and a crusty skin tag on her forehead.         Medications: Outpatient Medications Prior to Visit  Medication Sig   cholecalciferol (VITAMIN D3) 25 MCG (1000 UT) tablet Take 1,000 Units by mouth daily.   ipratropium (ATROVENT ) 0.03 % nasal spray Place 2 sprays into both nostrils every 12 (twelve) hours.   loratadine  (CLARITIN ) 10 MG tablet Take 1 tablet (10 mg total) by mouth daily.   Multiple Vitamin (MULTIVITAMIN)  tablet Take 1 tablet by mouth daily.   Omega-3 Fatty Acids (FISH OIL PO) Take by mouth.   omeprazole  (PRILOSEC) 20 MG capsule TAKE 1 CAPSULE BY MOUTH EVERY DAY   vitamin B-12 (CYANOCOBALAMIN ) 1000 MCG tablet Take 1,000 mcg by mouth daily.   [DISCONTINUED] azelastine  (ASTELIN ) 0.1 % nasal spray Place 2 sprays into both nostrils 2 (two) times daily. Use in each nostril as directed   [DISCONTINUED] losartan  (COZAAR ) 100 MG tablet Take 1 tablet (100 mg total) by mouth daily.   [DISCONTINUED] albuterol  (VENTOLIN  HFA) 108 (90 Base)  MCG/ACT inhaler Inhale 2 puffs into the lungs every 6 (six) hours as needed for wheezing or shortness of breath.   [DISCONTINUED] benzonatate  (TESSALON ) 100 MG capsule Take 1 capsule (100 mg total) by mouth 3 (three) times daily as needed for cough.   [DISCONTINUED] clotrimazole -betamethasone  (LOTRISONE ) cream Apply 1 application topically 2 (two) times daily.   [DISCONTINUED] fluticasone  (FLONASE ) 50 MCG/ACT nasal spray Place 2 sprays into both nostrils daily.   [DISCONTINUED] meclizine  (ANTIVERT ) 25 MG tablet Take 1 tablet (25 mg total) by mouth 3 (three) times daily as needed for dizziness. (Patient not taking: Reported on 11/29/2023)   [DISCONTINUED] ondansetron  (ZOFRAN -ODT) 4 MG disintegrating tablet Take 1 tablet (4 mg total) by mouth every 8 (eight) hours as needed. (Patient not taking: Reported on 11/29/2023)   No facility-administered medications prior to visit.    Review of Systems     Objective    BP (!) 140/105 (BP Location: Right Arm, Patient Position: Sitting, Cuff Size: Large)   Pulse 80   Ht 5' 6 (1.676 m)   Wt (!) 316 lb 9.6 oz (143.6 kg)   SpO2 99%   BMI 51.10 kg/m    Physical Exam Vitals reviewed.  Constitutional:      General: She is not in acute distress.    Appearance: Normal appearance. She is well-developed. She is not diaphoretic.  HENT:     Head: Normocephalic and atraumatic.  Eyes:     General: No scleral icterus.    Conjunctiva/sclera: Conjunctivae normal.  Neck:     Thyroid : No thyromegaly.  Cardiovascular:     Rate and Rhythm: Normal rate and regular rhythm.     Heart sounds: Normal heart sounds. No murmur heard. Pulmonary:     Effort: Pulmonary effort is normal. No respiratory distress.     Breath sounds: Normal breath sounds. No wheezing, rhonchi or rales.  Musculoskeletal:     Cervical back: Neck supple.     Right lower leg: No edema.     Left lower leg: No edema.  Lymphadenopathy:     Cervical: No cervical adenopathy.  Skin:     General: Skin is warm and dry.     Findings: No rash.     Comments: Skin tag on forehead  Neurological:     Mental Status: She is alert and oriented to person, place, and time. Mental status is at baseline.  Psychiatric:        Mood and Affect: Mood normal.        Behavior: Behavior normal.      No results found for any visits on 11/29/23.  Assessment & Plan     Problem List Items Addressed This Visit       Cardiovascular and Mediastinum   Primary hypertension - Primary   Hypertension remains uncontrolled despite increasing losartan  to 100 mg, with side effects of decreased libido and peripheral edema, likely multifactorial due to weight gain and  prolonged sitting. She is frustrated with weight gain and has made lifestyle changes such as reducing soda intake and increasing water consumption. Hydrochlorothiazide is proposed to address both hypertension and edema, with a plan to reduce losartan  back to 50 mg to mitigate side effects. - Reduce losartan  to 50 mg and add hydrochlorothiazide 25 mg to address both hypertension and edema. - Encourage continued lifestyle modifications, including reducing soda intake and increasing water consumption. - Discuss potential side effects of medications and monitor blood pressure and edema response.      Relevant Medications   losartan  (COZAAR ) 50 MG tablet   hydrochlorothiazide (HYDRODIURIL) 25 MG tablet     Other   Morbid obesity (HCC)   She has gained 10 pounds over the past two months and expresses frustration with weight management. Reports a sedentary lifestyle, fatigue, and difficulty with exercise due to low energy levels. Interested in weight loss medications but concerned about cost due to lack of insurance coverage. Topamax is proposed to help with cravings and potential binge eating behavior, with an emphasis on portion control and gradual increase in physical activity. - Prescribe Topamax 25mg  bid to help with cravings and potential  binge eating behavior. - Encourage gradual increase in physical activity, starting with enjoyable activities such as walking or using a stationary bike. - Discuss the importance of portion control and regular exercise in weight management. - Advise on healthy snacking options and the benefits of eating smaller, more frequent meals.      Prediabetes   Blood sugar levels are in the prediabetic range, consistent with previous results. Experiences episodes of dizziness, possibly related to blood sugar fluctuations, especially after consuming sugary foods. Advised on dietary modifications to prevent blood sugar spikes, including eating smaller, more frequent meals and incorporating protein with snacks. - Advise on dietary modifications to prevent blood sugar spikes, including eating smaller, more frequent meals and incorporating protein with snacks. - Monitor blood sugar levels and symptoms of dizziness.      Suspected sleep apnea   Reports symptoms suggestive of sleep apnea, including snoring and daytime fatigue. There is a potential link between weight gain and sleep apnea, which may contribute to hypertension and further weight gain. She is resistant to the idea of using a CPAP machine. Weight loss is discussed as a primary intervention to potentially alleviate symptoms. - Encourage weight loss as a primary intervention to potentially alleviate sleep apnea symptoms. - Discuss the potential benefits of improved sleep quality on overall health and energy levels. - unable to afford sleep study at this time with no insurance coverage - will reconsider next year      Other Visit Diagnoses       Eczema, unspecified type         Skin tag               Eczema She has a dry patch on her elbow, likely eczema. Has not been using any specific  treatment for it. Prescribed triamcinolone  cream to be applied twice daily to the affected area until improvement is noted. - Prescribe triamcinolone  cream  to be applied twice daily to the affected area until improvement is noted.  Skin Tag She has a skin tag on her forehead that has become crusty. Self-conscious about its appearance and has been manipulating it, causing further irritation. Discussed options for removal, including in-office procedures or at-home methods such as tying with hair or dental floss. - Discuss options for removal, including in-office procedures or at-home  methods such as tying with hair or dental floss. - Advise on potential for minor scarring post-removal and the cost implications without insurance.       Return in about 2 months (around 01/29/2024) for chronic disease f/u.       Aden Agreste, MD  [facility] [phone] (phone) [phone] (fax)  [facility]

## 2023-11-30 DIAGNOSIS — R29818 Other symptoms and signs involving the nervous system: Secondary | ICD-10-CM | POA: Insufficient documentation

## 2023-11-30 NOTE — Assessment & Plan Note (Signed)
 Reports symptoms suggestive of sleep apnea, including snoring and daytime fatigue. There is a potential link between weight gain and sleep apnea, which may contribute to hypertension and further weight gain. She is resistant to the idea of using a CPAP machine. Weight loss is discussed as a primary intervention to potentially alleviate symptoms. - Encourage weight loss as a primary intervention to potentially alleviate sleep apnea symptoms. - Discuss the potential benefits of improved sleep quality on overall health and energy levels. - unable to afford sleep study at this time with no insurance coverage - will reconsider next year

## 2023-11-30 NOTE — Assessment & Plan Note (Signed)
 Blood sugar levels are in the prediabetic range, consistent with previous results. Experiences episodes of dizziness, possibly related to blood sugar fluctuations, especially after consuming sugary foods. Advised on dietary modifications to prevent blood sugar spikes, including eating smaller, more frequent meals and incorporating protein with snacks. - Advise on dietary modifications to prevent blood sugar spikes, including eating smaller, more frequent meals and incorporating protein with snacks. - Monitor blood sugar levels and symptoms of dizziness.

## 2023-11-30 NOTE — Assessment & Plan Note (Signed)
 She has gained 10 pounds over the past two months and expresses frustration with weight management. Reports a sedentary lifestyle, fatigue, and difficulty with exercise due to low energy levels. Interested in weight loss medications but concerned about cost due to lack of insurance coverage. Topamax is proposed to help with cravings and potential binge eating behavior, with an emphasis on portion control and gradual increase in physical activity. - Prescribe Topamax 25mg  bid to help with cravings and potential binge eating behavior. - Encourage gradual increase in physical activity, starting with enjoyable activities such as walking or using a stationary bike. - Discuss the importance of portion control and regular exercise in weight management. - Advise on healthy snacking options and the benefits of eating smaller, more frequent meals.

## 2023-11-30 NOTE — Assessment & Plan Note (Signed)
 Hypertension remains uncontrolled despite increasing losartan  to 100 mg, with side effects of decreased libido and peripheral edema, likely multifactorial due to weight gain and prolonged sitting. She is frustrated with weight gain and has made lifestyle changes such as reducing soda intake and increasing water consumption. Hydrochlorothiazide is proposed to address both hypertension and edema, with a plan to reduce losartan  back to 50 mg to mitigate side effects. - Reduce losartan  to 50 mg and add hydrochlorothiazide 25 mg to address both hypertension and edema. - Encourage continued lifestyle modifications, including reducing soda intake and increasing water consumption. - Discuss potential side effects of medications and monitor blood pressure and edema response.

## 2023-12-01 NOTE — Telephone Encounter (Signed)
 She had medicaid family planning, so none of these are covered. She was going to look at [facility] cost

## 2024-01-09 ENCOUNTER — Encounter: Payer: Self-pay | Admitting: Family Medicine

## 2024-01-09 ENCOUNTER — Ambulatory Visit: Payer: Self-pay | Admitting: Family Medicine

## 2024-01-09 VITALS — BP 128/64 | HR 86 | Ht 66.0 in | Wt 319.1 lb

## 2024-01-09 DIAGNOSIS — Z6841 Body Mass Index (BMI) 40.0 and over, adult: Secondary | ICD-10-CM

## 2024-01-09 DIAGNOSIS — R7303 Prediabetes: Secondary | ICD-10-CM

## 2024-01-09 DIAGNOSIS — I10 Essential (primary) hypertension: Secondary | ICD-10-CM

## 2024-01-09 DIAGNOSIS — M545 Low back pain, unspecified: Secondary | ICD-10-CM

## 2024-01-09 MED ORDER — TOPIRAMATE 25 MG PO TABS: ORAL_TABLET | ORAL | 0 refills | Status: DC

## 2024-01-09 NOTE — Progress Notes (Unsigned)
 Established Patient Office Visit  Subjective   Patient ID: Kelsey Hughes, female    DOB: July 07, 1980  Age: 43 y.o. MRN: [id number]  Chief Complaint  Patient presents with   Follow-up    Patient was last seen for weight concerns on 09/27/23 and a    Obesity    goal was set to reduce soda consumption to four cans per day and replace with water as well as encouraged 10 minutes of physical activity per day, five days a week.     Back Pain    Patient reports low back pain while walking X few weeks. Reports she is not taking anything for pain.     Kelsey Hughes is a 43 yo female with a history of hypertension, prediabetes, hyperlipidemia, and obesity who presents to the clinic for management of weight, acute back pain, and hypertension.  On interview, she reports feeling okay. She states that she has been experiencing acute lower back pain over the past few months that starts 5-10 minutes into walking or working out. She describes the pain as a constant ache on both sides of her lower back without radiation. She rates the pain as a 9/10 and constant when it is occurring. She also reports not being able to lose weight recently. She has been able to cut back on soda consumption to three 12 oz soda cans a day. She reports not taking her topomax as the pharmacy never filled it. She reports that her back pain prevents her from losing weight which exacerbates her pain. As for her hypertension, she reports not taking her pressures at home, but taking her medications as recommended without any side effects. She continues to manage her prediabetes with dietary and exercise whenever possible. She reports no other concerns.    {History (Optional):23778}  ROS    Objective:     BP 128/64 (BP Location: Left Arm, Patient Position: Sitting)   Pulse 86   Ht 5' 6 (1.676 m)   Wt (!) 319 lb 1.6 oz (144.7 kg)   SpO2 100%   BMI 51.50 kg/m  {Vitals History (Optional):23777}  Physical Exam Vitals reviewed.   Constitutional:      General: She is not in acute distress.    Appearance: Normal appearance. She is not ill-appearing.  HENT:     Head: Normocephalic.     Right Ear: Tympanic membrane, ear canal and external ear normal.     Left Ear: Tympanic membrane, ear canal and external ear normal.     Nose: Nose normal.     Mouth/Throat:     Mouth: Mucous membranes are moist.     Pharynx: Oropharynx is clear.  Eyes:     General: No scleral icterus.    Conjunctiva/sclera: Conjunctivae normal.     Pupils: Pupils are equal, round, and reactive to light.  Cardiovascular:     Rate and Rhythm: Normal rate and regular rhythm.     Pulses: Normal pulses.  Pulmonary:     Effort: Pulmonary effort is normal. No respiratory distress.     Breath sounds: Normal breath sounds. No wheezing.  Abdominal:     General: There is no distension.     Palpations: Abdomen is soft.     Tenderness: There is no abdominal tenderness.  Musculoskeletal:     Cervical back: Normal range of motion.     Lumbar back: Tenderness (without radiation of pain) present.     Right lower leg: Edema present.     Left  lower leg: Edema present.  Lymphadenopathy:     Cervical: No cervical adenopathy.  Skin:    General: Skin is warm and dry.     Capillary Refill: Capillary refill takes less than 2 seconds.  Neurological:     Mental Status: She is alert and oriented to person, place, and time.     Sensory: Sensation is intact. No sensory deficit (intact in LLE and RLE).     Motor: Motor function is intact. No weakness or atrophy (intact in LLE and RLE).     Deep Tendon Reflexes: Reflexes are normal and symmetric. Reflexes normal.     Reflex Scores:      Achilles reflexes are 2+ on the right side and 2+ on the left side.   No results found for any visits on 01/09/24.  {Labs (Optional):23779}  The 10-year ASCVD risk score (Arnett DK, et al., 2019) is: 1.1%    Assessment & Plan:   Problem List Items Addressed This Visit        Cardiovascular and Mediastinum   Primary hypertension - Primary   Well controlled on current medication regimen. Reports taking both medications without any significant side effects (and improvement of libido after losartan  decrease). Has not been measuring home blood pressures but pressures in the office are 135/90 with machine and 128/64 with manual. Had a recent CMP in 04/25 that resulted wnl. Continues to have lower extremity edema with recent weight gain - Continue losartan  50 mg and hydrochlorothiazide  25 mg daily - Encouraged lifestyle modifications including decreased soda and increased water intake - Discussed lower extremity elevation and compression socks to help with lower extremity edema        Other   Morbid obesity (HCC)   Uncontrolled weight gain of 3 pounds over the last two months with continued frustration. Reports a sedentary lifestyle currently because of acute back pain aggravated by working out or walking. Has been able to cut back on soda consumption to 3 12oz cans a day and is motivated to decrease consumption further. Reports struggling with late night cravings and has not been taking Topomax to help with cravings.  - Start Topomax 25 mg bid to help with cravings with plan to increase to 50 mg bid after two weeks - Monitor for side effects including drowsiness; DO NOT increase if drowsiness is significant during the first two weeks - Encourage gradual increase in physical activity including walking, working out, and yoga - Discussed mindful eating when deciding on late night snacking - Encouraged goal of cutting down soda consumption to 2 12oz cans per day during the week      Prediabetes   Currently controlled with lifestyle modifications. Reports experiencing occasional episodes of low blood sugar, but reports improvement upon snacking. Last A1C was elevated at 6.0 with increasing trend. Expresses concern about transition to the diabetic range if A1Cs continue to  increase - Encouraged lifestyle modifications including diet, exercise, and weight loss to help manage A1C - Advised watching for significant blood sugar variations and related dizziness - Order PoC A1C (resulted 6.2)      Relevant Orders   POCT HgB A1C   Other Visit Diagnoses       Acute bilateral low back pain without sciatica          Acute bilateral low back pain without sciatica Reports experiencing low thoracic/upper lumbar back pain without radiation when walking or working out. Rates pain 9/10 with resolution of symptoms with rest. Denies experiencing any strength  or sensation changes in her lower extremities. Attributes her new symptoms to her recent weight gain. Likely muscular in origin with physical exam pattern of tenderness and intact strength and sensation. - Alternate acetaminophen  and ibuprofen  daily for pain relief as needed - Encouraged yoga for back pain to help strengthen back and core muscles - Encouraged continued diet and exercise modification to help weight loss and subsequent back pain  General Health Maintenance - Will address at next follow up visit  Return in about 3 months (around 04/10/2024) for weight f/u.    Kelsey Hughes, Medical Student

## 2024-01-09 NOTE — Assessment & Plan Note (Signed)
 Well controlled on current medication regimen. Reports taking both medications without any significant side effects (and improvement of libido after losartan  decrease). Has not been measuring home blood pressures but pressures in the office are 135/90 with machine and 128/64 with manual. Had a recent CMP in 04/25 that resulted wnl. Continues to have lower extremity edema with recent weight gain - Continue losartan  50 mg and hydrochlorothiazide  25 mg daily - Encouraged lifestyle modifications including decreased soda and increased water intake - Discussed lower extremity elevation and compression socks to help with lower extremity edema

## 2024-01-09 NOTE — Assessment & Plan Note (Addendum)
 Currently controlled with lifestyle modifications. Reports experiencing occasional episodes of low blood sugar, but reports improvement upon snacking. Last A1C was elevated at 6.0 with increasing trend. Expresses concern about transition to the diabetic range if A1Cs continue to increase - Encouraged lifestyle modifications including diet, exercise, and weight loss to help manage A1C - Advised watching for significant blood sugar variations and related dizziness - Order PoC A1C (resulted 6.2)

## 2024-01-09 NOTE — Assessment & Plan Note (Addendum)
 Uncontrolled weight gain of 3 pounds over the last two months with continued frustration. Reports a sedentary lifestyle currently because of acute back pain aggravated by working out or walking. Has been able to cut back on soda consumption to 3 12oz cans a day and is motivated to decrease consumption further. Reports struggling with late night cravings and has not been taking Topomax to help with cravings.  - Start Topomax 25 mg bid to help with cravings with plan to increase to 50 mg bid after two weeks - Monitor for side effects including drowsiness; DO NOT increase if drowsiness is significant during the first two weeks - Encourage gradual increase in physical activity including walking, working out, and yoga - Discussed mindful eating when deciding on late night snacking - Encouraged goal of cutting down soda consumption to 2 12oz cans per day during the week

## 2024-01-10 LAB — POCT GLYCOSYLATED HEMOGLOBIN (HGB A1C): Hemoglobin A1C: 6.2 % — AB (ref 4.0–5.6)

## 2024-01-30 ENCOUNTER — Ambulatory Visit: Admitting: Family Medicine

## 2024-04-10 ENCOUNTER — Ambulatory Visit: Payer: Self-pay | Admitting: Family Medicine

## 2024-04-10 ENCOUNTER — Encounter: Payer: Self-pay | Admitting: Family Medicine

## 2024-04-10 VITALS — BP 136/83 | HR 93 | Ht 66.0 in | Wt 326.7 lb

## 2024-04-10 DIAGNOSIS — G8929 Other chronic pain: Secondary | ICD-10-CM

## 2024-04-10 DIAGNOSIS — N951 Menopausal and female climacteric states: Secondary | ICD-10-CM

## 2024-04-10 DIAGNOSIS — R7303 Prediabetes: Secondary | ICD-10-CM

## 2024-04-10 DIAGNOSIS — M545 Low back pain, unspecified: Secondary | ICD-10-CM

## 2024-04-10 DIAGNOSIS — I10 Essential (primary) hypertension: Secondary | ICD-10-CM

## 2024-04-10 DIAGNOSIS — R29818 Other symptoms and signs involving the nervous system: Secondary | ICD-10-CM

## 2024-04-10 MED ORDER — LOSARTAN POTASSIUM 100 MG PO TABS: 100.0000 mg | ORAL_TABLET | Freq: Every day | ORAL | 1 refills | Status: AC

## 2024-04-10 NOTE — Assessment & Plan Note (Signed)
 Present, with a focus on weight management to prevent progression to diabetes. Discussed potential benefits of weight loss on blood glucose levels and overall metabolic health. - Encouraged weight loss to improve metabolic health.

## 2024-04-10 NOTE — Assessment & Plan Note (Signed)
 Morbid obesity significantly impacts daily activities and quality of life. Current weight loss attempts have been unsuccessful. Discussed potential use of GLP-1 receptor agonists (Wegovy or Zepbound) for weight loss, contingent on insurance coverage. Consideration of bariatric surgery if pharmacological options are not feasible. Discussed potential benefits of weight loss on overall health, including heart health and blood pressure improvement. A 10% weight loss could be transformative, equating to a 32-pound reduction. - Check insurance coverage for [facility] or Zepbound for weight loss. - Consider bariatric surgery if pharmacological options are not feasible. - Encouraged weight loss to improve overall health.

## 2024-04-10 NOTE — Assessment & Plan Note (Signed)
 Hypertension is currently not well controlled. Blood pressure readings are elevated. Current medication regimen includes losartan  and hydrochlorothiazide . Discussed increasing losartan  dosage to improve blood pressure control. - Increased losartan  to 100 mg daily. - Continued hydrochlorothiazide  25 mg daily. - Will reassess blood pressure control at follow-up.

## 2024-04-10 NOTE — Progress Notes (Signed)
 Established patient visit   Patient: Kelsey Hughes   DOB: [birth year]   43 y.o. Female  MRN: [id number] Visit Date: 04/10/2024  Today's healthcare provider: Jon Eva, MD   Chief Complaint  Patient presents with   Follow-up    Patient is present for 3 month weight follow-up   Subjective    HPI HPI     Follow-up    Additional comments: Patient is present for 3 month weight follow-up      Last edited by Lilian Fitzpatrick, CMA on 04/10/2024  3:19 PM.       Discussed the use of AI scribe software for clinical note transcription with the patient, who gave verbal consent to proceed.  History of Present Illness   Kelsey Hughes is a 42 year old female with hypertension who presents with weight gain and difficulty breathing.  She has experienced significant weight gain, impacting her ability to perform daily activities such as walking up stairs without exhaustion. She has tried GLP-1 patches without success and is interested in weight loss medications like Wegovy or Zepbound but is concerned about insurance coverage. She is worried about the risk of developing diabetes.  She experiences difficulty breathing, feeling like she needs oxygen during simple activities such as washing herself. She has symptoms of sleep apnea, including loud snoring, and is concerned about having sleep apnea, especially since her mother has it. She is hesitant about using a CPAP machine.  She has hypertension and is taking losartan , which she has been cutting in half to 50 mg, and hydrochlorothiazide  25 mg. Her blood pressure has been elevated recently. Her mother assists her with managing her medications.  She describes symptoms of perimenopause, including hot flashes and changes in appetite. She has tried natural supplements like Ashwagandha for stress relief.  She is experiencing significant back pain, which she attributes to her weight and lack of core strength. She has tried using a vibrating  plate to relieve leg tightness, which she finds somewhat helpful.         Medications: Outpatient Medications Prior to Visit  Medication Sig   cholecalciferol (VITAMIN D3) 25 MCG (1000 UT) tablet Take 1,000 Units by mouth daily.   hydrochlorothiazide  (HYDRODIURIL ) 25 MG tablet Take 1 tablet (25 mg total) by mouth daily.   ipratropium (ATROVENT ) 0.03 % nasal spray Place 2 sprays into both nostrils every 12 (twelve) hours.   loratadine  (CLARITIN ) 10 MG tablet Take 1 tablet (10 mg total) by mouth daily.   Multiple Vitamin (MULTIVITAMIN) tablet Take 1 tablet by mouth daily.   Omega-3 Fatty Acids (FISH OIL PO) Take by mouth.   omeprazole  (PRILOSEC) 20 MG capsule TAKE 1 CAPSULE BY MOUTH EVERY DAY   triamcinolone  ointment (KENALOG ) 0.5 % APPLY TO AFFECTED AREA TWICE A DAY   vitamin B-12 (CYANOCOBALAMIN ) 1000 MCG tablet Take 1,000 mcg by mouth daily.   [DISCONTINUED] losartan  (COZAAR ) 50 MG tablet Take 1 tablet (50 mg total) by mouth daily.   [DISCONTINUED] topiramate  (TOPAMAX ) 25 MG tablet Take 1 tablet (25 mg total) by mouth 2 (two) times daily for 14 days, THEN 2 tablets (50 mg total) 2 (two) times daily. (Patient not taking: No sig reported)   No facility-administered medications prior to visit.    Review of Systems     Objective    BP 136/83   Pulse 93   Ht 5' 6 (1.676 m)   Wt (!) 326 lb 11.2 oz (148.2 kg)   SpO2 97%  BMI 52.73 kg/m    Physical Exam Vitals reviewed.  Constitutional:      General: She is not in acute distress.    Appearance: Normal appearance. She is well-developed. She is not diaphoretic.  HENT:     Head: Normocephalic and atraumatic.  Eyes:     General: No scleral icterus.    Conjunctiva/sclera: Conjunctivae normal.  Neck:     Thyroid : No thyromegaly.  Cardiovascular:     Rate and Rhythm: Normal rate and regular rhythm.     Heart sounds: Normal heart sounds. No murmur heard. Pulmonary:     Effort: Pulmonary effort is normal. No respiratory  distress.     Breath sounds: Normal breath sounds. No wheezing, rhonchi or rales.  Musculoskeletal:     Cervical back: Neck supple.     Right lower leg: No edema.     Left lower leg: No edema.  Lymphadenopathy:     Cervical: No cervical adenopathy.  Skin:    General: Skin is warm and dry.     Findings: No rash.  Neurological:     Mental Status: She is alert and oriented to person, place, and time. Mental status is at baseline.  Psychiatric:        Mood and Affect: Mood normal.        Behavior: Behavior normal.      No results found for any visits on 04/10/24.  Assessment & Plan     Problem List Items Addressed This Visit       Cardiovascular and Mediastinum   Primary hypertension - Primary   Hypertension is currently not well controlled. Blood pressure readings are elevated. Current medication regimen includes losartan  and hydrochlorothiazide . Discussed increasing losartan  dosage to improve blood pressure control. - Increased losartan  to 100 mg daily. - Continued hydrochlorothiazide  25 mg daily. - Will reassess blood pressure control at follow-up.      Relevant Medications   losartan  (COZAAR ) 100 MG tablet     Other   Morbid obesity (HCC)   Morbid obesity significantly impacts daily activities and quality of life. Current weight loss attempts have been unsuccessful. Discussed potential use of GLP-1 receptor agonists (Wegovy or Zepbound) for weight loss, contingent on insurance coverage. Consideration of bariatric surgery if pharmacological options are not feasible. Discussed potential benefits of weight loss on overall health, including heart health and blood pressure improvement. A 10% weight loss could be transformative, equating to a 32-pound reduction. - Check insurance coverage for [facility] or Zepbound for weight loss. - Consider bariatric surgery if pharmacological options are not feasible. - Encouraged weight loss to improve overall health.      Prediabetes    Present, with a focus on weight management to prevent progression to diabetes. Discussed potential benefits of weight loss on blood glucose levels and overall metabolic health. - Encouraged weight loss to improve metabolic health.      Suspected sleep apnea   Due to symptoms of snoring and daytime fatigue. Discussed potential insurance coverage for Zepbound for sleep apnea if weight loss medications are not covered. Consideration of a sleep study if insurance does not cover Zepbound for sleep apnea. - Will check insurance coverage for Zepbound for sleep apnea. - Will consider sleep study if insurance does not cover Zepbound for sleep apnea.       Other Visit Diagnoses       Chronic bilateral low back pain without sciatica         Perimenopausal  Perimenopausal symptoms Experiencing significant perimenopausal symptoms, including weight gain and fatigue. Discussed potential use of natural supplements. Consideration of hormonal therapy to manage symptoms, though weight gain is a concern. - Will discuss potential hormonal therapy if symptoms persist.  Low back pain with possible sciatica Chronic low back pain with possible sciatica, exacerbated by weight and lack of core strength. Discussed the impact of weight on back pain and the importance of core strengthening exercises. - Encouraged core strengthening exercises and yoga for back pain management. - Referred to physical therapy for further evaluation and management.      Return in about 4 weeks (around 05/08/2024) for chronic disease f/u.       Jon Eva, MD  [facility] [phone] (phone) [phone] (fax)  [facility]

## 2024-04-10 NOTE — Assessment & Plan Note (Signed)
 Due to symptoms of snoring and daytime fatigue. Discussed potential insurance coverage for Zepbound for sleep apnea if weight loss medications are not covered. Consideration of a sleep study if insurance does not cover Zepbound for sleep apnea. - Will check insurance coverage for Zepbound for sleep apnea. - Will consider sleep study if insurance does not cover Zepbound for sleep apnea.

## 2024-05-10 ENCOUNTER — Encounter: Payer: Self-pay | Admitting: Family Medicine

## 2024-05-10 ENCOUNTER — Ambulatory Visit (INDEPENDENT_AMBULATORY_CARE_PROVIDER_SITE_OTHER): Payer: Self-pay | Admitting: Family Medicine

## 2024-05-10 ENCOUNTER — Other Ambulatory Visit: Payer: Self-pay | Admitting: Family Medicine

## 2024-05-10 VITALS — BP 130/80 | HR 92 | Ht 66.0 in | Wt 324.7 lb

## 2024-05-10 DIAGNOSIS — R7303 Prediabetes: Secondary | ICD-10-CM

## 2024-05-10 DIAGNOSIS — I10 Essential (primary) hypertension: Secondary | ICD-10-CM | POA: Diagnosis not present

## 2024-05-10 DIAGNOSIS — Z23 Encounter for immunization: Secondary | ICD-10-CM

## 2024-05-10 DIAGNOSIS — E782 Mixed hyperlipidemia: Secondary | ICD-10-CM | POA: Diagnosis not present

## 2024-05-10 DIAGNOSIS — R600 Localized edema: Secondary | ICD-10-CM

## 2024-05-10 DIAGNOSIS — M545 Low back pain, unspecified: Secondary | ICD-10-CM

## 2024-05-10 DIAGNOSIS — G8929 Other chronic pain: Secondary | ICD-10-CM

## 2024-05-10 MED ORDER — ZEPBOUND 2.5 MG/0.5ML ~~LOC~~ SOAJ: 2.5000 mg | SUBCUTANEOUS | 0 refills | Status: DC

## 2024-05-10 MED ORDER — TRIAMCINOLONE ACETONIDE 0.5 % EX OINT: TOPICAL_OINTMENT | Freq: Two times a day (BID) | CUTANEOUS | 5 refills | Status: AC

## 2024-05-10 MED ORDER — ZEPBOUND 5 MG/0.5ML ~~LOC~~ SOAJ: 5.0000 mg | SUBCUTANEOUS | 1 refills | Status: DC

## 2024-05-10 MED ORDER — OMEPRAZOLE 20 MG PO CPDR: 20.0000 mg | DELAYED_RELEASE_CAPSULE | Freq: Every day | ORAL | 3 refills | Status: AC

## 2024-05-10 NOTE — Assessment & Plan Note (Signed)
 Recommend low carb diet Recheck A1c

## 2024-05-10 NOTE — Assessment & Plan Note (Signed)
 Reviewed last lipid panel Not currently on a statin Recheck FLP and CMP Discussed diet and exercise

## 2024-05-10 NOTE — Assessment & Plan Note (Signed)
 Blood pressure is well-controlled at 130/80 mmHg with losartan  100 mg and hydrochlorothiazide  25 mg. - Continue losartan  100 mg daily. - Continue hydrochlorothiazide  25 mg daily.

## 2024-05-10 NOTE — Assessment & Plan Note (Signed)
 Weight decreased from 326 lbs in October to 324 lbs currently. Discussed Zepbound for weight management, considering insurance coverage. She prefers to avoid additional bills and will start with Zepbound. - Initiated Zepbound 2.5 mg once weekly for 4 weeks, then will increase to 5 mg weekly. - Monitor weight and adjust treatment as needed.

## 2024-05-10 NOTE — Progress Notes (Signed)
 Established patient visit   Patient: Kelsey Hughes   DOB: 1980/12/03   43 y.o. Female  MRN: [id number] Visit Date: 05/10/2024  Today's healthcare provider: Jon Eva, MD   Chief Complaint  Patient presents with   Medical Management of Chronic Issues   Hypertension    She was last seen for hypertension 4 weeks ago.  BP at that visit was 136/83. Management since that visit includes increase losartan  to 100 mg and continue hydrochlorothiazide  25 mg. She reports excellent compliance with treatment. She is not having side effects.  She is following a Regular diet. She is exercising until back starts to hurt. She does not smoke.  Symptoms: lower extremity edema   Subjective    HPI HPI     Hypertension    Additional comments: She was last seen for hypertension 4 weeks ago.  BP at that visit was 136/83. Management since that visit includes increase losartan  to 100 mg and continue hydrochlorothiazide  25 mg. She reports excellent compliance with treatment. She is not having side effects.  She is following a Regular diet. She is exercising until back starts to hurt. She does not smoke.  Symptoms: lower extremity edema      Last edited by Lilian Fitzpatrick, CMA on 05/10/2024  2:07 PM.       Discussed the use of AI scribe software for clinical note transcription with the patient, who gave verbal consent to proceed.  History of Present Illness   Kelsey Hughes is a 43 year old female with hypertension who presents with back pain and leg swelling.  She experiences persistent, severe back pain for several months, sometimes causing tears. The pain worsens with weight and is slightly relieved by sitting and stretching. Lidoderm  patches provide some relief.  Leg swelling worsens by the end of the day and decreases overnight. She takes losartan  100 mg and hydrochlorothiazide  25 mg for hypertension. Compression socks are difficult to use due to her size.  Her weight was 319  pounds in July, 326 pounds in October, and 324 pounds today. She is reducing soda intake to manage her weight. She experiences shortness of breath when climbing stairs, attributed to her weight.       Medications: Outpatient Medications Prior to Visit  Medication Sig   cholecalciferol (VITAMIN D3) 25 MCG (1000 UT) tablet Take 1,000 Units by mouth daily.   hydrochlorothiazide  (HYDRODIURIL ) 25 MG tablet Take 1 tablet (25 mg total) by mouth daily.   ipratropium (ATROVENT ) 0.03 % nasal spray Place 2 sprays into both nostrils every 12 (twelve) hours.   loratadine  (CLARITIN ) 10 MG tablet Take 1 tablet (10 mg total) by mouth daily.   losartan  (COZAAR ) 100 MG tablet Take 1 tablet (100 mg total) by mouth daily.   Multiple Vitamin (MULTIVITAMIN) tablet Take 1 tablet by mouth daily.   Omega-3 Fatty Acids (FISH OIL PO) Take by mouth.   vitamin B-12 (CYANOCOBALAMIN ) 1000 MCG tablet Take 1,000 mcg by mouth daily.   [DISCONTINUED] omeprazole  (PRILOSEC) 20 MG capsule TAKE 1 CAPSULE BY MOUTH EVERY DAY   [DISCONTINUED] triamcinolone  ointment (KENALOG ) 0.5 % APPLY TO AFFECTED AREA TWICE A DAY   No facility-administered medications prior to visit.    Review of Systems     Objective    BP 130/80 (BP Location: Left Arm, Patient Position: Sitting, Cuff Size: Large)   Pulse 92   Ht 5' 6 (1.676 m)   Wt (!) 324 lb 11.2 oz (147.3 kg)  SpO2 98%   BMI 52.41 kg/m  BP Readings from Last 3 Encounters:  05/10/24 130/80  04/10/24 136/83  01/09/24 128/64   Wt Readings from Last 3 Encounters:  05/10/24 (!) 324 lb 11.2 oz (147.3 kg)  04/10/24 (!) 326 lb 11.2 oz (148.2 kg)  01/09/24 (!) 319 lb 1.6 oz (144.7 kg)      Physical Exam Vitals reviewed.  Constitutional:      General: She is not in acute distress.    Appearance: Normal appearance. She is well-developed. She is not diaphoretic.  HENT:     Head: Normocephalic and atraumatic.  Eyes:     General: No scleral icterus.    Conjunctiva/sclera:  Conjunctivae normal.  Neck:     Thyroid : No thyromegaly.  Cardiovascular:     Rate and Rhythm: Normal rate and regular rhythm.     Heart sounds: Normal heart sounds. No murmur heard. Pulmonary:     Effort: Pulmonary effort is normal. No respiratory distress.     Breath sounds: Normal breath sounds. No wheezing, rhonchi or rales.  Musculoskeletal:     Cervical back: Neck supple.     Right lower leg: Edema present.     Left lower leg: Edema present.  Lymphadenopathy:     Cervical: No cervical adenopathy.  Skin:    General: Skin is warm and dry.     Findings: No rash.  Neurological:     Mental Status: She is alert and oriented to person, place, and time. Mental status is at baseline.  Psychiatric:        Mood and Affect: Mood normal.        Behavior: Behavior normal.      No results found for any visits on 05/10/24.  Assessment & Plan     Problem List Items Addressed This Visit       Cardiovascular and Mediastinum   Primary hypertension - Primary   Blood pressure is well-controlled at 130/80 mmHg with losartan  100 mg and hydrochlorothiazide  25 mg. - Continue losartan  100 mg daily. - Continue hydrochlorothiazide  25 mg daily.      Relevant Orders   Comprehensive metabolic panel with GFR     Other   Morbid obesity (HCC)   Weight decreased from 326 lbs in October to 324 lbs currently. Discussed Zepbound for weight management, considering insurance coverage. She prefers to avoid additional bills and will start with Zepbound. - Initiated Zepbound 2.5 mg once weekly for 4 weeks, then will increase to 5 mg weekly. - Monitor weight and adjust treatment as needed.      Relevant Medications   tirzepatide (ZEPBOUND) 2.5 MG/0.5ML Pen   tirzepatide (ZEPBOUND) 5 MG/0.5ML Pen   Prediabetes   Recommend low carb diet Recheck A1c       Relevant Orders   Hemoglobin A1c   Mixed hyperlipidemia   Reviewed last lipid panel Not currently on a statin Recheck FLP and CMP Discussed  diet and exercise       Relevant Orders   Comprehensive metabolic panel with GFR   Lipid panel   Bilateral lower extremity edema   Other Visit Diagnoses       Immunization due       Relevant Orders   Flu vaccine trivalent PF, 6mos and older(Flulaval,Afluria,Fluarix,Fluzone) (Completed)     Chronic bilateral low back pain without sciatica             Chronic back pain Exacerbated by weight, relieved by stretching and the McKenzie method. She prefers  to avoid physical therapy due to cost concerns. - Continue stretching exercises and McKenzie method for back pain management.  Lower extremity edema Edema improves overnight but is tight in the morning. Compression socks are difficult to use due to weight. Discussed zip-up compression socks for easier use. - Recommended zip-up compression socks for easier use.  General Health Maintenance Discussed flu vaccination and mammogram screening. She agreed to receive a flu shot today and will consider a mammogram at a later date. - Administered flu shot today. - Will schedule mammogram at a later date.        Return in about 3 months (around 08/10/2024) for CPE.       Jon Eva, MD  [facility] [phone] (phone) [phone] (fax)  [facility]

## 2024-05-18 NOTE — Telephone Encounter (Signed)

## 2024-05-22 NOTE — Telephone Encounter (Signed)
 Patient said she is now on private insurance through work, not [employer]. Can we update her insurance info so PA gets done correctly?

## 2024-05-24 ENCOUNTER — Telehealth: Payer: Self-pay | Admitting: Pharmacy Technician

## 2024-05-24 ENCOUNTER — Other Ambulatory Visit (HOSPITAL_COMMUNITY): Payer: Self-pay

## 2024-05-24 NOTE — Telephone Encounter (Signed)
 Pharmacy Patient Advocate Encounter   Received notification from RX Request Messages that prior authorization for Zepbound  is required/requested.   (Need new ins info)- card on file doesn't give the processing information for pharmacy benefit.    Called pt. No answer, unable to leave a message. Called the pharmacy but they only had her medicaid information. Messaged pt through mychart.

## 2024-05-28 ENCOUNTER — Encounter: Payer: Self-pay | Admitting: Family Medicine

## 2024-05-28 ENCOUNTER — Ambulatory Visit (INDEPENDENT_AMBULATORY_CARE_PROVIDER_SITE_OTHER): Payer: Self-pay | Admitting: Family Medicine

## 2024-05-28 VITALS — BP 127/72 | HR 93 | Temp 97.8°F | Ht 66.0 in | Wt 328.1 lb

## 2024-05-28 DIAGNOSIS — L918 Other hypertrophic disorders of the skin: Secondary | ICD-10-CM

## 2024-05-28 MED ORDER — LIDOCAINE HCL 1 % IJ SOLN: 4.0000 mL | Freq: Once | INTRAMUSCULAR | Status: AC

## 2024-05-28 NOTE — Progress Notes (Signed)
 Established patient visit   Patient: Kelsey Hughes   DOB: [birth year]   43 y.o. Female  MRN: [id number] Visit Date: 05/28/2024  Today's healthcare provider: Jon Eva, MD   Chief Complaint  Patient presents with   Skin Tag    Removal   Subjective    HPI HPI     Skin Tag    Additional comments: Removal      Last edited by Terrel Powell CROME, CMA on 05/28/2024  1:09 PM.       Discussed the use of AI scribe software for clinical note transcription with the patient, who gave verbal consent to proceed.  History of Present Illness   Kelsey Hughes is a 43 year old female who presents for removal of skin tags.  She has about eight bothersome skin tags on her neck, armpits, and bra line. Larger, hanging tags catch on her bra and cause discomfort. She previously had neck skin tags removed with cryotherapy and is open to removal again, and prefers to leave smaller, flat tags in place.  She is familiar with topical numbing agents from using lidocaine  patches for back pain.  She is working through insurance changes after [employer] cancellation and is providing new insurance information, which may affect coverage for today's procedure and weight loss treatment.         Medications: Outpatient Medications Prior to Visit  Medication Sig   cholecalciferol (VITAMIN D3) 25 MCG (1000 UT) tablet Take 1,000 Units by mouth daily.   hydrochlorothiazide  (HYDRODIURIL ) 25 MG tablet Take 1 tablet (25 mg total) by mouth daily.   ipratropium (ATROVENT ) 0.03 % nasal spray Place 2 sprays into both nostrils every 12 (twelve) hours.   loratadine  (CLARITIN ) 10 MG tablet Take 1 tablet (10 mg total) by mouth daily.   losartan  (COZAAR ) 100 MG tablet Take 1 tablet (100 mg total) by mouth daily.   Multiple Vitamin (MULTIVITAMIN) tablet Take 1 tablet by mouth daily.   Omega-3 Fatty Acids (FISH OIL PO) Take by mouth.   omeprazole  (PRILOSEC) 20 MG capsule Take 1 capsule (20 mg total) by mouth daily.    tirzepatide  (ZEPBOUND ) 2.5 MG/0.5ML Pen Inject 2.5 mg into the skin once a week.   tirzepatide  (ZEPBOUND ) 5 MG/0.5ML Pen Inject 5 mg into the skin once a week.   triamcinolone  ointment (KENALOG ) 0.5 % Apply topically 2 (two) times daily.   vitamin B-12 (CYANOCOBALAMIN ) 1000 MCG tablet Take 1,000 mcg by mouth daily.   No facility-administered medications prior to visit.    Review of Systems     Objective    BP 127/72 (BP Location: Left Arm, Patient Position: Sitting, Cuff Size: Large)   Pulse 93   Temp 97.8 F (36.6 C) (Oral)   Ht 5' 6 (1.676 m)   Wt (!) 328 lb 1.6 oz (148.8 kg)   SpO2 97%   BMI 52.96 kg/m    Physical Exam Skin:    Comments: Multiple skin tags in axillae, bra line, and one large one on forehead Some irritation around bra lines      No results found for any visits on 05/28/24.  Assessment & Plan     Problem List Items Addressed This Visit   None Visit Diagnoses       Skin tag    -  Primary   Relevant Medications   lidocaine  (XYLOCAINE ) 1 % (with pres) injection 4 mL          Skin tags Multiple skin  tags on the neck and armpits, with larger ones causing discomfort by catching on clothing. Previous removal was successful with minimal complications. Current removal involved 15 skin tags, with some requiring numbing due to size and location. Discussed potential for minor scarring and importance of keeping areas clean to prevent infection. - Removed 15 skin tags using ethyl chloride and surgical excision as needed. - lidocaine  used on forehead skin tag as it is larger - Applied Band-Aids to areas on the bra line to prevent irritation. - Advised use of Vaseline on healing areas to promote smooth healing. - Instructed to monitor for signs of infection, such as spreading redness. - Advised on the use of ice for soreness and Tylenol  for discomfort. - Scheduled follow-up appointment in February.        Return if symptoms worsen or fail to improve.        Jon Eva, MD  [facility] [phone] (phone) [phone] (fax)  [facility]

## 2024-05-29 NOTE — Telephone Encounter (Signed)
 Spoke with patient advised to update insurance with pharmacy so that we can get processed handled with her medication. She verbalized understanding and reports she will get on it today

## 2024-05-30 ENCOUNTER — Telehealth: Payer: Self-pay

## 2024-05-30 DIAGNOSIS — Z1231 Encounter for screening mammogram for malignant neoplasm of breast: Secondary | ICD-10-CM

## 2024-05-30 DIAGNOSIS — Z1211 Encounter for screening for malignant neoplasm of colon: Secondary | ICD-10-CM

## 2024-05-30 NOTE — Telephone Encounter (Signed)
 Contacted patient in regards to colorectal screening. Reports she does not want to complete this year. Advised of IFOBT. Patient ok with picking up kit here in office and will pick up next week. I verbalized understanding and advised it will be at the front desk. Order placed  Patient also declined completing mammogram this year. Advised order good for 1 year and she can call to schedule at her convenience.

## 2024-06-04 ENCOUNTER — Other Ambulatory Visit (HOSPITAL_COMMUNITY): Payer: Self-pay

## 2024-06-18 ENCOUNTER — Other Ambulatory Visit (HOSPITAL_COMMUNITY): Payer: Self-pay

## 2024-07-24 ENCOUNTER — Ambulatory Visit (INDEPENDENT_AMBULATORY_CARE_PROVIDER_SITE_OTHER): Admitting: Physician Assistant

## 2024-07-24 ENCOUNTER — Encounter: Payer: Self-pay | Admitting: Physician Assistant

## 2024-07-24 ENCOUNTER — Ambulatory Visit: Payer: Self-pay

## 2024-07-24 VITALS — BP 125/79 | HR 85 | Temp 98.2°F | Resp 14 | Ht 66.0 in | Wt 330.9 lb

## 2024-07-24 DIAGNOSIS — H9202 Otalgia, left ear: Secondary | ICD-10-CM

## 2024-07-24 DIAGNOSIS — J0100 Acute maxillary sinusitis, unspecified: Secondary | ICD-10-CM

## 2024-07-24 DIAGNOSIS — R7303 Prediabetes: Secondary | ICD-10-CM | POA: Diagnosis not present

## 2024-07-24 MED ORDER — AMOXICILLIN-POT CLAVULANATE 875-125 MG PO TABS: 1.0000 | ORAL_TABLET | Freq: Two times a day (BID) | ORAL | 0 refills | Status: AC

## 2024-07-24 NOTE — Progress Notes (Signed)
 " Established patient visit  Patient: Kelsey Hughes   DOB: 06/30/1980   44 y.o. Female  MRN: [id number] Visit Date: 07/24/2024  Today's healthcare provider: Jolynn Spencer, PA-C   Chief Complaint  Patient presents with   Cough    Sinus pressure,productive cough that started a week and a half ago. left ear throbbing last night.OTC Meds (Mucinex sinus Max No fever,sob,chest tightness.   Subjective     HPI     Cough    Additional comments: Sinus pressure,productive cough that started a week and a half ago. left ear throbbing last night.OTC Meds (Mucinex sinus Max No fever,sob,chest tightness.      Last edited by Rosas, Joseline E, CMA on 07/24/2024 11:24 AM.       Discussed the use of AI scribe software for clinical note transcription with the patient, who gave verbal consent to proceed.  History of Present Illness Kelsey Hughes is a 44 year old female who presents with sinus pressure, ear pain, and sore throat.  She has had cold symptoms for nearly two weeks with initial improvement followed by worsening diffuse pressure headaches and sinus pressure involving her whole head. Last night she developed new throbbing left-sided ear pain and sore throat that woke her from sleep. She has prediabetes. She has no known issues with penicillin.       05/28/2024    1:13 PM 11/29/2023    3:30 PM 09/27/2023    2:41 PM  Depression screen PHQ 2/9  Decreased Interest 0 0 0  Down, Depressed, Hopeless 0 0 0  PHQ - 2 Score 0 0 0  Altered sleeping 0 0 1  Tired, decreased energy 0 1 2  Change in appetite 0 0 1  Feeling bad or failure about yourself  0 0 0  Trouble concentrating 0 0 0  Moving slowly or fidgety/restless 0 0 0  Suicidal thoughts 0 0   PHQ-9 Score 0 1  4   Difficult doing work/chores Not difficult at all Not difficult at all      Data saved with a previous flowsheet row definition      05/28/2024    1:14 PM 11/29/2023    3:31 PM 09/27/2023    2:41 PM  GAD 7 : Generalized Anxiety  Score  Nervous, Anxious, on Edge 0  0  0   Control/stop worrying 0  0  0   Worry too much - different things 0  0  0   Trouble relaxing 0  0  0   Restless 0  1  0   Easily annoyed or irritable 0  1  1   Afraid - awful might happen 0  0  0   Total GAD 7 Score 0 2 1  Anxiety Difficulty Not difficult at all Not difficult at all      Data saved with a previous flowsheet row definition    Medications: Show/hide medication list[1]  Review of Systems All negative Except see HPI       Objective    BP 125/79 (BP Location: Right Arm, Patient Position: Sitting)   Pulse 85   Temp 98.2 F (36.8 C) (Oral)   Resp 14   Ht 5' 6 (1.676 m)   Wt (!) 330 lb 14.4 oz (150.1 kg)   SpO2 97%   BMI 53.41 kg/m     Physical Exam Vitals reviewed.  Constitutional:      Appearance: She is normal weight.  HENT:  Head: Normocephalic and atraumatic.     Right Ear: Ear canal and external ear normal.     Left Ear: Ear canal and external ear normal.     Ears:     Comments: Tragus test positive    Nose: Congestion and rhinorrhea present.     Mouth/Throat:     Pharynx: Posterior oropharyngeal erythema present.     Comments: Postnasal drainage noted Eyes:     General: No scleral icterus.       Right eye: No discharge.        Left eye: No discharge.     Extraocular Movements: Extraocular movements intact.     Pupils: Pupils are equal, round, and reactive to light.  Cardiovascular:     Rate and Rhythm: Normal rate and regular rhythm.  Pulmonary:     Effort: Pulmonary effort is normal.     Breath sounds: Normal breath sounds.  Abdominal:     General: Abdomen is flat. Bowel sounds are normal.     Palpations: Abdomen is soft.  Lymphadenopathy:     Cervical: No cervical adenopathy.  Neurological:     Mental Status: She is alert.      No results found for any visits on 07/24/24.      Assessment and Plan Assessment & Plan Acute maxillary sinusitis/earache of the left ear X 2 weeks   symptoms suggest bacterial infection due to worsening after initial improvement. Tragus test positive, possible ear infection. Prediabetes noted. - Prescribed penicillin for 10 days. - Recommended taking penicillin with yogurt or kefir to prevent yeast infection. - Advised taking penicillin after meals to reduce gastrointestinal discomfort. - Suggested antihistamines, nasal saline spray or gel, air humidifier, and Mucinex for symptom relief. - Recommended warm compresses and warm salt gargles for throat pain. - Advised wearing hat outdoors to prevent cold air exposure. - Instructed to follow up with Doctor B for ongoing management.  RTC if symptoms worsen or persist  Prediabetes Chronic and stable Last A1c was 6.2 on 01/10/2024 Zepbound  5 mg weekly was discontinued due to cost Encouraged to continue with low-carb diet and regular exercise Will follow-up  No orders of the defined types were placed in this encounter.   No follow-ups on file.   The patient was advised to call back or seek an in-person evaluation if the symptoms worsen or if the condition fails to improve as anticipated.  I discussed the assessment and treatment plan with the patient. The patient was provided an opportunity to ask questions and all were answered. The patient agreed with the plan and demonstrated an understanding of the instructions.  I, Darlisha Kelm, PA-C have reviewed all documentation for this visit. The documentation on 07/24/2024  for the exam, diagnosis, procedures, and orders are all accurate and complete.  Jolynn Spencer, [facility], MMS [facility] [phone] (phone) [phone] (fax)  [facility]     [1]  Outpatient Medications Prior to Visit  Medication Sig   cholecalciferol (VITAMIN D3) 25 MCG (1000 UT) tablet Take 1,000 Units by mouth daily.   hydrochlorothiazide  (HYDRODIURIL ) 25 MG tablet Take 1 tablet (25 mg total) by mouth daily.   ipratropium (ATROVENT )  0.03 % nasal spray Place 2 sprays into both nostrils every 12 (twelve) hours.   loratadine  (CLARITIN ) 10 MG tablet Take 1 tablet (10 mg total) by mouth daily.   losartan  (COZAAR ) 100 MG tablet Take 1 tablet (100 mg total) by mouth daily.   Multiple Vitamin (MULTIVITAMIN) tablet Take 1  tablet by mouth daily.   Omega-3 Fatty Acids (FISH OIL PO) Take by mouth.   omeprazole  (PRILOSEC) 20 MG capsule Take 1 capsule (20 mg total) by mouth daily.   triamcinolone  ointment (KENALOG ) 0.5 % Apply topically 2 (two) times daily.   vitamin B-12 (CYANOCOBALAMIN ) 1000 MCG tablet Take 1,000 mcg by mouth daily.   tirzepatide  (ZEPBOUND ) 2.5 MG/0.5ML Pen Inject 2.5 mg into the skin once a week.   tirzepatide  (ZEPBOUND ) 5 MG/0.5ML Pen Inject 5 mg into the skin once a week.   Facility-Administered Medications Prior to Visit  Medication Dose Route Frequency Provider   lidocaine  (XYLOCAINE ) 1 % (with pres) injection 4 mL  4 mL Intradermal Once Bacigalupo, Angela M, MD   "

## 2024-07-24 NOTE — Telephone Encounter (Signed)
 FYI Only or Action Required?: FYI only for provider: appointment scheduled on 07/24/24.  Patient was last seen in primary care on 05/28/2024 by Myrla Jon HERO, MD.  Called Nurse Triage reporting Otalgia and Cough.  Symptoms began a week ago.  Interventions attempted: OTC medications: Sinus pressure and cough medicine.  Symptoms are: gradually worsening.  Triage Disposition: See Physician Within 24 Hours  Patient/caregiver understands and will follow disposition?:    Message from [place] S sent at 07/24/2024  8:09 AM EST  Reason for Triage: head congestion, LT ear pain, sore throat   Reason for Disposition  Earache  (Exceptions: Brief ear pain of lasting less than 60 minutes, or earache occurring during air travel.)  [1] Continuous (nonstop) coughing interferes with work or school AND [2] no improvement using cough treatment per Care Advice  Answer Assessment - Initial Assessment Questions Patient called with productive coughing that started a week and a half ago.    1. ONSET: When did the cough begin?      Week and half ago  3. SPUTUM: Describe the color of your sputum (e.g., none, dry cough; clear, white, yellow, green)     Bright green  5. DIFFICULTY BREATHING: Are you having difficulty breathing? If Yes, ask: How bad is it? (e.g., mild, moderate, severe)      No  6. FEVER: Do you have a fever? If Yes, ask: What is your temperature, how was it measured, and when did it start?     No  10. OTHER SYMPTOMS: Do you have any other symptoms? (e.g., runny nose, wheezing, chest pain)       Left ear pain  Answer Assessment - Initial Assessment Questions 1. LOCATION: Which ear is involved?     Left  2. ONSET: When did the ear pain start?      Yesterday  5. FEVER: Do you have a fever? If Yes, ask: What is your temperature, how was it measured, and when did it start?     No  7. OTHER SYMPTOMS: Do you have any other symptoms? (e.g., decreased  hearing, dizziness, headache, stiff neck, vomiting)     Denies neck pain but does have a sore throat and tenderness when touched; has runny nose and cough  Protocols used: Earache-A-AH, Cough - Acute Productive-A-AH

## 2024-08-09 ENCOUNTER — Ambulatory Visit: Payer: Self-pay | Admitting: Family Medicine
